# Patient Record
Sex: Female | Born: 1985 | Race: Black or African American | Hispanic: No | Marital: Married | Smoking: Never smoker
Health system: Southern US, Community
[De-identification: ages and names within clinical notes are randomized; demographics above are authoritative.]

## PROBLEM LIST (undated history)

## (undated) ENCOUNTER — Inpatient Hospital Stay (HOSPITAL_COMMUNITY): Payer: Self-pay

## (undated) DIAGNOSIS — E559 Vitamin D deficiency, unspecified: Secondary | ICD-10-CM

## (undated) DIAGNOSIS — Z9889 Other specified postprocedural states: Secondary | ICD-10-CM

## (undated) DIAGNOSIS — E669 Obesity, unspecified: Secondary | ICD-10-CM

## (undated) DIAGNOSIS — F32A Depression, unspecified: Secondary | ICD-10-CM

## (undated) DIAGNOSIS — F419 Anxiety disorder, unspecified: Secondary | ICD-10-CM

## (undated) DIAGNOSIS — D509 Iron deficiency anemia, unspecified: Secondary | ICD-10-CM

## (undated) DIAGNOSIS — R112 Nausea with vomiting, unspecified: Secondary | ICD-10-CM

## (undated) HISTORY — PX: OTHER SURGICAL HISTORY: SHX169

## (undated) HISTORY — DX: Vitamin D deficiency, unspecified: E55.9

## (undated) HISTORY — PX: GASTRIC BYPASS: SHX52

## (undated) HISTORY — DX: Iron deficiency anemia, unspecified: D50.9

## (undated) SURGERY — Surgical Case
Anesthesia: *Unknown

---

## 2014-04-20 ENCOUNTER — Ambulatory Visit: Payer: Self-pay | Admitting: Specialist

## 2014-05-12 HISTORY — PX: GASTRIC BYPASS: SHX52

## 2014-05-12 HISTORY — PX: ESOPHAGOGASTRODUODENOSCOPY: SHX1529

## 2015-10-24 ENCOUNTER — Encounter: Payer: Self-pay | Admitting: Emergency Medicine

## 2015-10-24 ENCOUNTER — Emergency Department
Admission: EM | Admit: 2015-10-24 | Discharge: 2015-10-25 | Disposition: A | Discharge status: Home / Self Care | Payer: Managed Care, Other (non HMO) | Attending: Emergency Medicine | Admitting: Emergency Medicine

## 2015-10-24 DIAGNOSIS — N760 Acute vaginitis: Secondary | ICD-10-CM | POA: Diagnosis not present

## 2015-10-24 DIAGNOSIS — Z8719 Personal history of other diseases of the digestive system: Secondary | ICD-10-CM | POA: Diagnosis not present

## 2015-10-24 DIAGNOSIS — N390 Urinary tract infection, site not specified: Secondary | ICD-10-CM

## 2015-10-24 DIAGNOSIS — N898 Other specified noninflammatory disorders of vagina: Secondary | ICD-10-CM | POA: Diagnosis present

## 2015-10-24 DIAGNOSIS — B9689 Other specified bacterial agents as the cause of diseases classified elsewhere: Secondary | ICD-10-CM

## 2015-10-24 LAB — URINALYSIS COMPLETE WITH MICROSCOPIC (ARMC ONLY)
BACTERIA UA: NONE SEEN
Bilirubin Urine: NEGATIVE
GLUCOSE, UA: NEGATIVE mg/dL
Hgb urine dipstick: NEGATIVE
KETONES UR: NEGATIVE mg/dL
NITRITE: NEGATIVE
PH: 5 (ref 5.0–8.0)
PROTEIN: NEGATIVE mg/dL
SPECIFIC GRAVITY, URINE: 1.018 (ref 1.005–1.030)

## 2015-10-24 LAB — CBC
HEMATOCRIT: 37.9 % (ref 35.0–47.0)
Hemoglobin: 12.8 g/dL (ref 12.0–16.0)
MCH: 30.2 pg (ref 26.0–34.0)
MCHC: 33.8 g/dL (ref 32.0–36.0)
MCV: 89.2 fL (ref 80.0–100.0)
PLATELETS: 204 10*3/uL (ref 150–440)
RBC: 4.25 MIL/uL (ref 3.80–5.20)
RDW: 13 % (ref 11.5–14.5)
WBC: 8.5 10*3/uL (ref 3.6–11.0)

## 2015-10-24 LAB — BASIC METABOLIC PANEL
ANION GAP: 8 (ref 5–15)
BUN: 7 mg/dL (ref 6–20)
CALCIUM: 8.8 mg/dL — AB (ref 8.9–10.3)
CO2: 23 mmol/L (ref 22–32)
CREATININE: 0.55 mg/dL (ref 0.44–1.00)
Chloride: 111 mmol/L (ref 101–111)
Glucose, Bld: 85 mg/dL (ref 65–99)
Potassium: 3 mmol/L — ABNORMAL LOW (ref 3.5–5.1)
SODIUM: 142 mmol/L (ref 135–145)

## 2015-10-24 LAB — POCT PREGNANCY, URINE: PREG TEST UR: NEGATIVE

## 2015-10-24 NOTE — ED Notes (Signed)
Pt c/o lower abd and lower back abd - Pt states she thinks she has a UTI - Pt c/o burning with urination and urinary frequency

## 2015-10-24 NOTE — ED Notes (Signed)
Patient ambulatory to triage with steady gait, without difficulty or distress noted; pt reports dysuria since last week with lower back and lower abd pain

## 2015-10-25 LAB — WET PREP, GENITAL
Sperm: NONE SEEN
TRICH WET PREP: NONE SEEN
YEAST WET PREP: NONE SEEN

## 2015-10-25 LAB — CHLAMYDIA/NGC RT PCR (ARMC ONLY)
CHLAMYDIA TR: NOT DETECTED
N gonorrhoeae: NOT DETECTED

## 2015-10-25 MED ORDER — METRONIDAZOLE 0.75 % VA GEL: 1.0000 | Freq: Every day | VAGINAL | Status: DC

## 2015-10-25 MED ORDER — CEPHALEXIN 500 MG PO CAPS: 500.0000 mg | ORAL_CAPSULE | Freq: Three times a day (TID) | ORAL | Status: AC

## 2015-10-25 MED ORDER — CEPHALEXIN 500 MG PO CAPS
500.0000 mg | ORAL_CAPSULE | Freq: Once | ORAL | Status: AC
  Administered 2015-10-25: 500 mg via ORAL
  Filled 2015-10-25: qty 1

## 2015-10-25 NOTE — Discharge Instructions (Signed)
Bacterial Vaginosis °Bacterial vaginosis is an infection of the vagina. It happens when too many germs (bacteria) grow in the vagina. Having this infection puts you at risk for getting other infections from sex. Treating this infection can help lower your risk for other infections, such as:  °· Chlamydia. °· Gonorrhea. °· HIV. °· Herpes. °HOME CARE °· Take your medicine as told by your doctor. °· Finish your medicine even if you start to feel better. °· Tell your sex partner that you have an infection. They should see their doctor for treatment. °· During treatment: °· Avoid sex or use condoms correctly. °· Do not douche. °· Do not drink alcohol unless your doctor tells you it is ok. °· Do not breastfeed unless your doctor tells you it is ok. °GET HELP IF: °· You are not getting better after 3 days of treatment. °· You have more grey fluid (discharge) coming from your vagina than before. °· You have more pain than before. °· You have a fever. °MAKE SURE YOU:  °· Understand these instructions. °· Will watch your condition. °· Will get help right away if you are not doing well or get worse. °  °This information is not intended to replace advice given to you by your health care provider. Make sure you discuss any questions you have with your health care provider. °  °Document Released: 02/05/2008 Document Revised: 05/19/2014 Document Reviewed: 12/08/2012 °Elsevier Interactive Patient Education ©2016 [employer] °Urinary Tract Infection °A urinary tract infection (UTI) can occur any place along the urinary tract. The tract includes the kidneys, ureters, bladder, and urethra. A type of germ called bacteria often causes a UTI. UTIs are often helped with antibiotic medicine.  °HOME CARE  °· If given, take antibiotics as told by your doctor. Finish them even if you start to feel better. °· Drink enough fluids to keep your pee (urine) clear or pale yellow. °· Avoid tea, drinks with caffeine, and bubbly (carbonated)  drinks. °· Pee often. Avoid holding your pee in for a long time. °· Pee before and after having sex (intercourse). °· Wipe from front to back after you poop (bowel movement) if you are a woman. Use each tissue only once. °GET HELP RIGHT AWAY IF:  °· You have back pain. °· You have lower belly (abdominal) pain. °· You have chills. °· You feel sick to your stomach (nauseous). °· You throw up (vomit). °· Your burning or discomfort with peeing does not go away. °· You have a fever. °· Your symptoms are not better in 3 days. °MAKE SURE YOU:  °· Understand these instructions. °· Will watch your condition. °· Will get help right away if you are not doing well or get worse. °  °This information is not intended to replace advice given to you by your health care provider. Make sure you discuss any questions you have with your health care provider. °  °Document Released: 10/15/2007 Document Revised: 05/19/2014 Document Reviewed: 11/27/2011 °Elsevier Interactive Patient Education ©2016 Elsevier Inc. ° °

## 2015-10-25 NOTE — ED Provider Notes (Signed)
[facility] Emergency Department Provider Note   ____________________________________________  Time seen: Approximately 12 AM  I have reviewed the triage vital signs and the nursing notes.   HISTORY  Chief Complaint Dysuria; Back Pain; and Abdominal Pain    HPI Haley Kramer is a 30 y.o. female with a history of a gastric bypass surgery as well as a diagnosis of gonorrhea which was treated this past March was presenting to the emergency department today with pressure when she urinates that radiates to her back. She also planning of a vaginal discharge. She is also recently treated for bacterial vaginosis in late May at [facility].  She says that she was negative for gonorrhea and Chlamydia as well as Trichomonas at that time and has not had any new sexual partners.She says the current vaginal discharges her baseline vaginal discharge.   History reviewed. No pertinent past medical history.  There are no active problems to display for this patient.   Past Surgical History  Procedure Laterality Date  . Gastric bypass      No current outpatient prescriptions on file.  Allergies Review of patient's allergies indicates no known allergies.  No family history on file.  Social History Social History  Substance Use Topics  . Smoking status: Never Smoker   . Smokeless tobacco: None  . Alcohol Use: No    Review of Systems Constitutional: No fever/chills Eyes: No visual changes. ENT: No sore throat. Cardiovascular: Denies chest pain. Respiratory: Denies shortness of breath. Gastrointestinal: No abdominal pain.  No nausea, no vomiting.  No diarrhea.  No constipation. Genitourinary:As above Musculoskeletal: Negative for back pain. Skin: Negative for rash. Neurological: Negative for headaches, focal weakness or numbness.  10-point ROS otherwise negative.  ____________________________________________   PHYSICAL EXAM:  VITAL SIGNS: ED  Triage Vitals  Enc Vitals Group     BP 10/24/15 2046 146/82 mmHg     Pulse Rate 10/24/15 2046 73     Resp 10/24/15 2046 18     Temp 10/24/15 2046 98 F (36.7 C)     Temp Source 10/24/15 2046 Oral     SpO2 10/24/15 2046 99 %     Weight 10/24/15 2046 195 lb (88.451 kg)     Height 10/24/15 2046  (1.702 m)     Head Cir --      Peak Flow --      Pain Score 10/24/15 2044 5     Pain Loc --      Pain Edu? --      Excl. in GC? --     Constitutional: Alert and oriented. Well appearing and in no acute distress. Eyes: Conjunctivae are normal. PERRL. EOMI. Head: Atraumatic. Nose: No congestion/rhinnorhea. Mouth/Throat: Mucous membranes are moist.   Neck: No stridor.   Cardiovascular: Normal rate, regular rhythm. Grossly normal heart sounds.  Good peripheral circulation. Respiratory: Normal respiratory effort.  No retractions. Lungs CTAB. Gastrointestinal: Soft and nontender. No distention.No CVA tenderness. Genitourinary: Normal external exam. Speculum exam with a small amount of white discharge. Bimanual exam without cervical motion tenderness.  No uterine or adnexal tenderness nor masses. Musculoskeletal: No lower extremity tenderness nor edema.  No joint effusions. Neurologic:  Normal speech and language. No gross focal neurologic deficits are appreciated.  Skin:  Skin is warm, dry and intact. No rash noted. Psychiatric: Mood and affect are normal. Speech and behavior are normal.  ____________________________________________   LABS (all labs ordered are listed, but only abnormal results are displayed)  Labs Reviewed  WET PREP, GENITAL - Abnormal; Notable for the following:    Clue Cells Wet Prep HPF POC PRESENT (*)    WBC, Wet Prep HPF POC FEW (*)    All other components within normal limits  BASIC METABOLIC PANEL - Abnormal; Notable for the following:    Potassium 3.0 (*)    Calcium 8.8 (*)    All other components within normal limits  URINALYSIS COMPLETEWITH MICROSCOPIC  ([facility] ONLY) - Abnormal; Notable for the following:    Color, Urine YELLOW (*)    APPearance HAZY (*)    Leukocytes, UA 1+ (*)    Squamous Epithelial / LPF 0-5 (*)    All other components within normal limits  URINE CULTURE  CHLAMYDIA/NGC RT PCR ([facility] ONLY)  CBC  POCT PREGNANCY, URINE   ____________________________________________  EKG   ____________________________________________  RADIOLOGY   ____________________________________________   PROCEDURES   ____________________________________________   INITIAL IMPRESSION / ASSESSMENT AND PLAN / ED COURSE  Pertinent labs & imaging results that were available during my care of the patient were reviewed by me and considered in my medical decision making (see chart for details).  ----------------------------------------- 1:18 AM on 10/25/2015 -----------------------------------------  Reviewed the lab results with the patient. It is possible secondary to her gastric bypass as she had poor absorption and still has the bacterial vaginosis present on her vaginal swabs. We will try a local cream for treatment. I'll also treat her empirically for urinary tract infection. She has symptoms of urinary tract infection but underwhelming urinalysis. Will send for culture. However, due to her treatments will treat mostly based on symptoms. She understands this plan and is willing to comply. Will be following up with her OB/GYN at the health Department. ____________________________________________   FINAL CLINICAL IMPRESSION(S) / ED DIAGNOSES  Urinary tract infection. Bacterial vaginosis.    NEW MEDICATIONS STARTED DURING THIS VISIT:  New Prescriptions   No medications on file     Note:  This document was prepared using Dragon voice recognition software and may include unintentional dictation errors.    Myrna Blazeravid Matthew Jovaun Levene, MD 10/25/15 [phone]

## 2015-10-26 LAB — URINE CULTURE

## 2019-02-25 ENCOUNTER — Other Ambulatory Visit: Payer: Self-pay

## 2019-02-25 ENCOUNTER — Ambulatory Visit (LOCAL_COMMUNITY_HEALTH_CENTER): Payer: Self-pay

## 2019-02-25 DIAGNOSIS — Z111 Encounter for screening for respiratory tuberculosis: Secondary | ICD-10-CM

## 2019-02-28 ENCOUNTER — Other Ambulatory Visit: Payer: Self-pay

## 2019-02-28 ENCOUNTER — Ambulatory Visit (LOCAL_COMMUNITY_HEALTH_CENTER): Payer: Self-pay

## 2019-02-28 DIAGNOSIS — Z111 Encounter for screening for respiratory tuberculosis: Secondary | ICD-10-CM

## 2019-02-28 LAB — TB SKIN TEST
Induration: 0 mm
TB Skin Test: NEGATIVE

## 2020-02-20 ENCOUNTER — Other Ambulatory Visit: Payer: Self-pay

## 2020-02-20 ENCOUNTER — Ambulatory Visit (LOCAL_COMMUNITY_HEALTH_CENTER): Payer: Medicaid Other

## 2020-02-20 DIAGNOSIS — Z111 Encounter for screening for respiratory tuberculosis: Secondary | ICD-10-CM

## 2020-02-20 DIAGNOSIS — Z23 Encounter for immunization: Secondary | ICD-10-CM | POA: Diagnosis not present

## 2020-02-20 NOTE — Progress Notes (Signed)
PPD placed today. Tdap and Flu vaccines given today and tolerated well. Updated [facility] copy given and explained. Jerel Shepherd, RN

## 2020-02-23 ENCOUNTER — Other Ambulatory Visit: Payer: Self-pay

## 2020-02-23 ENCOUNTER — Ambulatory Visit (LOCAL_COMMUNITY_HEALTH_CENTER): Payer: Medicaid Other

## 2020-02-23 DIAGNOSIS — Z111 Encounter for screening for respiratory tuberculosis: Secondary | ICD-10-CM

## 2020-02-23 LAB — TB SKIN TEST
Induration: 0 mm
TB Skin Test: NEGATIVE

## 2020-02-29 ENCOUNTER — Other Ambulatory Visit: Payer: Medicaid Other

## 2020-05-07 ENCOUNTER — Other Ambulatory Visit: Payer: Medicaid Other

## 2020-05-07 DIAGNOSIS — Z20822 Contact with and (suspected) exposure to covid-19: Secondary | ICD-10-CM

## 2020-05-08 LAB — SARS-COV-2, NAA 2 DAY TAT

## 2020-05-08 LAB — NOVEL CORONAVIRUS, NAA: SARS-CoV-2, NAA: NOT DETECTED

## 2020-06-13 ENCOUNTER — Ambulatory Visit: Payer: Medicaid Other | Admitting: Physician Assistant

## 2020-06-13 ENCOUNTER — Encounter: Payer: Self-pay | Admitting: Physician Assistant

## 2020-06-13 ENCOUNTER — Other Ambulatory Visit: Payer: Self-pay

## 2020-06-13 DIAGNOSIS — Z113 Encounter for screening for infections with a predominantly sexual mode of transmission: Secondary | ICD-10-CM | POA: Diagnosis not present

## 2020-06-13 DIAGNOSIS — B9689 Other specified bacterial agents as the cause of diseases classified elsewhere: Secondary | ICD-10-CM

## 2020-06-13 DIAGNOSIS — N760 Acute vaginitis: Secondary | ICD-10-CM | POA: Diagnosis not present

## 2020-06-13 DIAGNOSIS — I10 Essential (primary) hypertension: Secondary | ICD-10-CM | POA: Insufficient documentation

## 2020-06-13 LAB — WET PREP FOR TRICH, YEAST, CLUE
Trichomonas Exam: NEGATIVE
Yeast Exam: NEGATIVE

## 2020-06-13 MED ORDER — METRONIDAZOLE 500 MG PO TABS: 500.0000 mg | ORAL_TABLET | Freq: Two times a day (BID) | ORAL | 0 refills | Status: AC

## 2020-06-13 NOTE — Progress Notes (Signed)
  [facility] Department STI clinic/screening visit  Subjective:  Haley Kramer is a 35 y.o. female being seen today for an STI screening visit. The patient reports they do have symptoms.  Patient reports that they do not desire a pregnancy in the next year.   They reported they are not interested in discussing contraception today.  Patient's last menstrual period was 06/07/2020.   Patient has the following medical conditions:   Patient Active Problem List   Diagnosis Date Noted  . Benign hypertension 06/13/2020    Chief Complaint  Patient presents with  . SEXUALLY TRANSMITTED DISEASE    screening    HPI  Patient reports that she has had a fishy vaginal odor for 3 months.  Denies other symptoms and chronic conditions.  Reports that she is on Remeron for anxiety and depression.  Reports last HIV test was 2 years ago and last pap was in 2020.  States that her IUD fell out on 05/11/2020, and she has an appointment with her regular provider to discuss her options.   See flowsheet for further details and programmatic requirements.    The following portions of the patient's history were reviewed and updated as appropriate: allergies, current medications, past medical history, past social history, past surgical history and problem list.  Objective:  There were no vitals filed for this visit.  Physical Exam Constitutional:      General: She is not in acute distress.    Appearance: Normal appearance.  HENT:     Head: Normocephalic and atraumatic.  Eyes:     Conjunctiva/sclera: Conjunctivae normal.  Pulmonary:     Effort: Pulmonary effort is normal.  Skin:    General: Skin is warm and dry.     Findings: No bruising, erythema, lesion or rash.  Neurological:     Mental Status: She is alert and oriented to person, place, and time.  Psychiatric:        Mood and Affect: Mood normal.        Behavior: Behavior normal.        Thought Content: Thought content normal.         Judgment: Judgment normal.      Assessment and Plan:  Teana R Sporer is a 35 y.o. female presenting to the [facility] Department for STI screening  1. Screening for STD (sexually transmitted disease) Patient into clinic with symptoms. Patient declines provider exam and opts to self-collect vaginal samples.  Counseled patient how to collect for accurate results. Rec condoms with all sex. Await test results.  Counseled that RN will call if needs to RTC for treatment once results are back. - WET PREP FOR TRICH, YEAST, CLUE - Chlamydia/Gonorrhea [place] [facility] - HIV [place] [facility] - Syphilis Serology, [place]  2. BV (bacterial vaginosis) Will treat for BV with Metronidazole 500 mg #14 1 po BID for 7 days with food, no EtOH for 24 hr before and until 72 hr after completing medicine. No sex for 14 days. Enc to use OTC antifungal cream if has itching during or just after using antibiotics. Reviewed with patient steps to prevent BV. - metroNIDAZOLE (FLAGYL) 500 MG tablet; Take 1 tablet (500 mg total) by mouth 2 (two) times daily for 7 days.  Dispense: 14 tablet; Refill: 0   No follow-ups on file.  No future appointments.  Matt Holmes, PA

## 2020-07-02 ENCOUNTER — Encounter: Payer: Self-pay | Admitting: Student

## 2020-10-25 ENCOUNTER — Ambulatory Visit: Payer: Medicaid Other

## 2020-10-29 ENCOUNTER — Other Ambulatory Visit: Payer: Self-pay

## 2020-10-29 ENCOUNTER — Ambulatory Visit (LOCAL_COMMUNITY_HEALTH_CENTER): Payer: Medicaid Other | Admitting: Physician Assistant

## 2020-10-29 ENCOUNTER — Encounter: Payer: Self-pay | Admitting: Physician Assistant

## 2020-10-29 VITALS — BP 121/84 | Ht 67.0 in | Wt 208.8 lb

## 2020-10-29 DIAGNOSIS — Z01419 Encounter for gynecological examination (general) (routine) without abnormal findings: Secondary | ICD-10-CM | POA: Diagnosis not present

## 2020-10-29 DIAGNOSIS — Z309 Encounter for contraceptive management, unspecified: Secondary | ICD-10-CM

## 2020-10-29 DIAGNOSIS — Z3009 Encounter for other general counseling and advice on contraception: Secondary | ICD-10-CM | POA: Diagnosis not present

## 2020-10-29 DIAGNOSIS — Z113 Encounter for screening for infections with a predominantly sexual mode of transmission: Secondary | ICD-10-CM

## 2020-10-29 LAB — PREGNANCY, URINE: Preg Test, Ur: NEGATIVE

## 2020-10-29 NOTE — Progress Notes (Addendum)
Pt here for PE.  Pt has not had her period in 2 mos and is trying to get pregnant.  Home PT was negative x 2 weeks ago.   Patient informed of negative PT taken this morning. Berdie Ogren, RN

## 2020-10-29 NOTE — Progress Notes (Signed)
[facility] DEPARTMENT [facility] [address] Main Number: [phone]    Family Planning Visit- Initial Visit  Subjective:  Haley Kramer is a 35 y.o.  G1P0   being seen today for an initial annual visit and to discuss contraceptive options.  The patient is currently using None for pregnancy prevention. Patient reports she does want a pregnancy in the next year.  Patient has the following medical conditions has Benign hypertension on their problem list.  Chief Complaint  Patient presents with   Contraception    Initial/annual visit.    Patient reports that she had gastric bypass surgery and has so far lost about 250 lbs.  States that she would like to get pregnant but has not had a period since April.  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  Reports feeling really stressed lately and that her migraines are usually when she has added stressors, though she has not had more migraines lately.  Reports that she has "regular" headaches occasionally that are relieved with rest and OTC analgesics.  Reports that she has a goal weight of 160 lbs and is still working on making it to that goal.  Reports nausea is related to being post gastric bypass and she has not had any change to the nausea.  States that her feet and ankles swell if she is on her feet for long period but that the swelling resolves when she sits with feet elevated.  Reports that she has not had elevated BP since she has lost weight after her surgery.  States last pap/physical was at [facility] in 2019.  Patient denies any other concerns today.    Body mass index is 32.7 kg/m. - Patient is eligible for diabetes screening based on BMI and age >25?  not applicable HA1C ordered? not applicable  Patient reports 2  partner/s in last year. Desires STI screening?  Yes  Has patient been screened once for HCV in the past?  No  No results found for:  HCVAB  Does the patient have current drug use (including MJ), have a partner with drug use, and/or has been incarcerated since last result? No  If yes-- Screen for HCV through [facility]   Does the patient meet criteria for HBV testing? No  Criteria:  -Household, sexual or needle sharing contact with HBV -History of drug use -HIV positive -Those with known Hep C   Health Maintenance Due  Topic Date Due   HIV Screening  Never done   Hepatitis C Screening  Never done   PAP SMEAR-Modifier  Never done   COVID-19 Vaccine (2 - Booster for Janssen series) 10/13/2019    Review of Systems  All other systems reviewed and are negative.  The following portions of the patient's history were reviewed and updated as appropriate: allergies, current medications, past family history, past medical history, past social history, past surgical history and problem list. Problem list updated.   See flowsheet for other program required questions.  Objective:   Vitals:   10/29/20 1007  BP: 121/84  Weight: 208 lb 12.8 oz (94.7 kg)  Height: 5\' 7"  (1.702 m)    Physical Exam Constitutional:      General: She is not in acute distress.    Appearance: Normal appearance.  HENT:     Head: Normocephalic and atraumatic.     Mouth/Throat:     Mouth: Mucous membranes are moist.  Pharynx: Oropharynx is clear. No oropharyngeal exudate or posterior oropharyngeal erythema.  Eyes:     Conjunctiva/sclera: Conjunctivae normal.  Neck:     Thyroid: No thyroid mass, thyromegaly or thyroid tenderness.  Cardiovascular:     Rate and Rhythm: Normal rate and regular rhythm.  Pulmonary:     Effort: Pulmonary effort is normal.  Chest:  Breasts:    Right: Normal. No mass, nipple discharge, skin change, tenderness, axillary adenopathy or supraclavicular adenopathy.     Left: Normal. No mass, nipple discharge, skin change, tenderness, axillary adenopathy or supraclavicular adenopathy.  Abdominal:      Palpations: Abdomen is soft. There is no mass.     Tenderness: There is no abdominal tenderness. There is no guarding or rebound.  Genitourinary:    General: Normal vulva.     Rectum: Normal.     Comments: External genitalia/pubic area without nits, lice, edema, erythema, lesions and inguinal adenopathy. Vagina with normal mucosa and discharge. Cervix without visible lesions. Uterus firm, mobile, nt, no masses, no CMT, no adnexal tenderness or fullness.  Musculoskeletal:     Cervical back: Neck supple. No tenderness.  Lymphadenopathy:     Cervical: No cervical adenopathy.     Upper Body:     Right upper body: No supraclavicular, axillary or pectoral adenopathy.     Left upper body: No supraclavicular, axillary or pectoral adenopathy.  Skin:    General: Skin is warm and dry.     Findings: No bruising, erythema, lesion or rash.  Neurological:     Mental Status: She is alert and oriented to person, place, and time.  Psychiatric:        Mood and Affect: Mood normal.        Behavior: Behavior normal.        Thought Content: Thought content normal.        Judgment: Judgment normal.      Assessment and Plan:  Haley Kramer is a 35 y.o. female presenting to the [facility] Department for an initial annual wellness/contraceptive visit  Contraception counseling: Reviewed all forms of birth control options in the tiered based approach. available including abstinence; over the counter/barrier methods; hormonal contraceptive medication including pill, patch, ring, injection,contraceptive implant, ECP; hormonal and nonhormonal IUDs; permanent sterilization options including vasectomy and the various tubal sterilization modalities. Risks, benefits, and typical effectiveness rates were reviewed.  Questions were answered.  Written information was also given to the patient to review.  Patient desires to achieve pregnancy. She will follow up in  3-4 months if she does not resume having  her periods or 1 year for annual exam for surveillance.  She was told to call with any further questions, or with any concerns about this method of contraception.  Emphasized use of condoms 100% of the time for STI prevention.  Patient was not a candidate for ECP today.   1. Encounter for counseling regarding contraception Reviewed with patient how to track cycle and determine when ovulation occurs. Enc to try hobbies to relax and relieve stress. RTC if changes mind re: achieving pregnancy to discuss hormonal BCM.  2. Screening for STD (sexually transmitted disease) Await test results.  Counseled that RN will call if needs to RTC for treatment once results are back.  - Chlamydia/Gonorrhea [facility] - HIV/HCV [place] [facility] - Syphilis Serology, [place] [facility]  3. Well woman exam with routine gynecological exam Reviewed with patient healthy habits to maintain general health. Enc regular exercise and  healthy diet, avoid EtOH, tobacco, excess caffeine and any teratogens. Enc MVI 1 po daily. Patient counseled to RTC in 3-4 months if menses has not resumed for evaluation and possible Provera challenge. Enc to establish with/ follow up with PCP for primary care concerns, age appropriate screenings and illness.  Await pap results.  Counseled that RN will call or send a letter once results are back.  - IGP, Aptima HPV - Pregnancy, urine     Return in about 1 year (around 10/29/2021) for RP and prn.  No future appointments.  Matt Holmes, PA

## 2020-11-01 LAB — HM HEPATITIS C SCREENING LAB: HM Hepatitis Screen: NEGATIVE

## 2020-11-01 LAB — IGP, APTIMA HPV
HPV Aptima: NEGATIVE
PAP Smear Comment: 0

## 2020-11-01 LAB — HM HIV SCREENING LAB: HM HIV Screening: NEGATIVE

## 2020-12-19 ENCOUNTER — Ambulatory Visit (LOCAL_COMMUNITY_HEALTH_CENTER): Payer: Medicaid Other

## 2020-12-19 ENCOUNTER — Other Ambulatory Visit: Payer: Self-pay

## 2020-12-19 VITALS — BP 112/75 | Ht 67.0 in | Wt 201.5 lb

## 2020-12-19 DIAGNOSIS — Z3201 Encounter for pregnancy test, result positive: Secondary | ICD-10-CM | POA: Diagnosis not present

## 2020-12-19 LAB — PREGNANCY, URINE: Preg Test, Ur: POSITIVE — AB

## 2020-12-19 MED ORDER — PRENATAL 27-0.8 MG PO TABS: 1.0000 | ORAL_TABLET | Freq: Every day | ORAL | 0 refills | Status: AC

## 2020-12-19 NOTE — Progress Notes (Signed)
Consulted by RN re: patient situation.  Reviewed RN note and agree that it reflects our discussion and my recommendations. 

## 2020-12-19 NOTE — Progress Notes (Signed)
UPT positive. Plans prenatal care at [facility]. Hx bariatric surgery 2016 and lost over 300 lbs. States she contacted her surgeon Dr. Smitty Cords [facility]) who recommends frequent small meals, increase protein intake, and liquid prenatal vitamin which she is currently taking. Pt states that Dr. Smitty Cords gave ok to take prenatal vitamin tablet if runs out of liquid prenatal vitamin. Reports nausea and taking Zofran that she had previously.  Consult Beatris Si, PA who advises pt to stop zofran d/t precaution warning in pregnancy and recommends frequent small meals, ginger ale, ginger snaps, peppermint or sour candies as ways to decrease nausea. Advised to establish prenatal care and consult her provider. Pt in agreement. Questions answered and reports understanding. To clerk for preadmit. Jerel Shepherd, RN

## 2020-12-28 ENCOUNTER — Telehealth: Payer: Self-pay | Admitting: Family Medicine

## 2020-12-28 NOTE — Telephone Encounter (Signed)
Patient went to the ER last night because of light bleeding. She was told to have a follow up.

## 2020-12-28 NOTE — Telephone Encounter (Signed)
Client with positive UPT at [facility] 12/19/2020 and to [facility] yesterday for evaluation of vaginal bleeding. Per client, left after 4 hours as tired. Client did not see provider and no US done. 12/27/2020 beta hcg = 20, 248. Consult with Elveria Rising FNP-BC and client needs beta hcg next week. Appt scheduled. Client counseled that all [facility] will do at appt is draw her pregnancy hormone level. Counseled to return to ED for worsening vaginal bleeding / pain. Reports vaginal spotting only this am. Jossie Ng, RN

## 2020-12-31 ENCOUNTER — Ambulatory Visit (INDEPENDENT_AMBULATORY_CARE_PROVIDER_SITE_OTHER): Payer: Medicaid Other | Admitting: Obstetrics and Gynecology

## 2020-12-31 ENCOUNTER — Encounter: Payer: Self-pay | Admitting: Obstetrics and Gynecology

## 2020-12-31 ENCOUNTER — Other Ambulatory Visit: Payer: Medicaid Other

## 2020-12-31 ENCOUNTER — Other Ambulatory Visit: Payer: Self-pay

## 2020-12-31 ENCOUNTER — Ambulatory Visit
Admission: RE | Admit: 2020-12-31 | Discharge: 2020-12-31 | Disposition: A | Discharge status: Home / Self Care | Payer: Medicaid Other | Source: Ambulatory Visit | Attending: Obstetrics and Gynecology | Admitting: Obstetrics and Gynecology

## 2020-12-31 VITALS — BP 130/70 | Ht 67.0 in | Wt 200.0 lb

## 2020-12-31 DIAGNOSIS — O200 Threatened abortion: Secondary | ICD-10-CM

## 2020-12-31 DIAGNOSIS — O3680X0 Pregnancy with inconclusive fetal viability, not applicable or unspecified: Secondary | ICD-10-CM

## 2020-12-31 NOTE — Patient Instructions (Signed)
Threatened Miscarriage A threatened miscarriage occurs when a woman has vaginal bleeding during the first 20 weeks of pregnancy but the pregnancy has not ended. If vaginal bleeding occurs during this time, the health care provider will do tests to make sure the woman is still pregnant. The woman's condition may be considered a threatened miscarriage if the tests show: That she is still pregnant. That the embryo or unborn baby (fetus) inside the uterus is still growing. A threatened miscarriage does not mean your pregnancy will end, but it does increase the risk of losing your pregnancy (miscarriage). What are the causes? The cause of this condition is usually not known. What increases the risk? The following factors may make a pregnant woman more likely to have amiscarriage: Certain medical conditions Conditions that affect the hormone balance in the body, such as thyroid disease or polycystic ovary syndrome. Diabetes. Autoimmune disorders. Infections. Bleeding disorders. Obesity. Lifestyle factors Using products with tobacco or nicotine or being exposed to tobacco smoke. Having alcohol. Having large amounts of caffeine. Recreational drug use. Problems with reproductive organs or structures Cervical insufficiency. This is when the the lowest part of the uterus (cervix) opens and thins before pregnancy is at term. Having a condition called Asherman syndrome, which causes scarring in the uterus or causes the uterus to be abnormal in structure. Fibrous growths, called fibroids, in the uterus. Congenital abnormalities. These problems are present at birth. Infection of the cervix or uterus. Personal or medical history Injury (trauma). Having had a miscarriage before. Being younger than age 86 or older than age 24. Exposure to harmful substances in the environment. This may include radiation or heavy metals, such as lead. Using certain medicines. What are the signs or symptoms? Symptoms  of this condition include: Vaginal bleeding or spotting, with or without cramps or pain. Mild pain or cramps in your abdomen. How is this diagnosed? You may have tests to check whether you are still pregnant. These tests will be done if you have bleeding, with or without pain, in your abdomen before the 20th week of pregnancy. These tests include: Ultrasound. A physical exam. Measurement of your baby's heart rate. Lab tests, such as blood tests, urine tests, or swabs for infection. You may be diagnosed with a threatened miscarriage if: Ultrasound testing shows that you are still pregnant. Your baby's heart rate is strong. A physical exam shows that your cervix is closed. Blood tests confirm that you are still pregnant. How is this treated? No treatments have been shown to prevent a threatened miscarriage from going onto a complete miscarriage. However, the right home care is important. Follow these instructions at home: Get plenty of rest. Do not have sex, douche, or put anything in your vagina, such as tampons, until your health care provider says it is okay. Do not smoke or use recreational drugs. Do not drink alcohol. Avoid caffeine. Keep all follow-up prenatal visits. This is important. Contact a health care provider if: You have light vaginal bleeding or spotting while pregnant. You have pain or cramping in your abdomen. You have a fever. Get help right away if: Heavy bleeding soaks through 2 large sanitary pads an hour for more than 2 hours. Blood clots come out of your vagina. Tissue comes out of your vagina. You leak fluid, or you have a gush of fluid from your vagina. You have severe low back pain or cramps in your abdomen. You have a fever, chills, and severe pain in the abdomen. Summary A threatened miscarriage  occurs when a woman bleeds from the vagina during the first 20 weeks of pregnancy but the pregnancy has not ended. The cause of a threatened miscarriage is  usually not known. Symptoms of this condition may include vaginal bleeding and mild pain or cramps in your abdomen. No treatments have been shown to prevent a threatened miscarriage from going on to a complete miscarriage. Keep all follow-up prenatal visits. This is important. This information is not intended to replace advice given to you by your health care provider. Make sure you discuss any questions you have with your healthcare provider. Document Revised: 10/28/2019 Document Reviewed: 10/28/2019 Elsevier Patient Education  2022 [employer].

## 2020-12-31 NOTE — Telephone Encounter (Signed)
Consulted by RN re: patient situation.  Reviewed RN note and agree that it reflects our discussion and my recommendations.  ° ° °Diamon Reddinger, FNP  °

## 2020-12-31 NOTE — Progress Notes (Signed)
Patient ID: Haley Kramer, female   DOB: [birth year], 35 y.o.   MRN: [id number]  Reason for Consult: Follow-up   Referred by [place], Julie A, MD  Subjective:     HPI:  Haley Kramer is a 35 y.o. female.  She reports that her first positive pregnancy test was on August 1.  She had a small amount of light bleeding and spotting which occurred on August 18.  She went to the [facility] ER and had a beta-hCG which was performed which was elevated around 20,000.  She left without being able to see a provider or an ultrasound to have an ultrasound performed.  She had more vaginal bleeding which occurred on Saturday, 20 August.  She reports that she was seen in [place] have [facility] that day.  She had a abdominal ultrasound performed which showed a gestational sac but no fetal pole.  She cannot have a transvaginal ultrasound performed that day.  She reports that she has not had any further bleeding or pain.  She has planned follow-up on September 16 with the health department to establish prenatal care.  She has a history of bariatric surgery and has been taking her bariatric vitamins as well as a prenatal vitamin.  She denies taking any other medications.   Gynecological History  Patient's last menstrual period was 11/09/2020. Menarche: 11  History of fibroids, polyps, or ovarian cysts? : no  History of PCOS? no Hstory of Endometriosis? no History of abnormal pap smears? no Have you had any sexually transmitted infections in the past? yes  She denies HPV vaccination in the past.   Last Pap: Results were: 2022- NIL   She identifies as a female. She is sexually active with men.   She denies dyspareunia. She denies postcoital bleeding.  She currently uses none for contraception.   Obstetrical History OB History  Gravida Para Term Preterm AB Living  2 1 1  0 0 1  SAB IAB Ectopic Multiple Live Births  0 0 0 0 1    # Outcome Date GA Lbr Len/2nd Weight Sex Delivery  Anes PTL Lv  2 Current           1 Term 03/23/11    F    LIV     No past medical history on file. Family History  Problem Relation Age of Onset   Hypertension Mother    Diabetes Mother    Hypertension Father    Diabetes Father    Hypertension Maternal Grandmother    Diabetes Maternal Grandmother    Hypertension Maternal Grandfather    Diabetes Maternal Grandfather    Past Surgical History:  Procedure Laterality Date   CESAREAN SECTION  03/22/2010   GASTRIC BYPASS      Short Social History:  Social History   Tobacco Use   Smoking status: Never   Smokeless tobacco: Never  Substance Use Topics   Alcohol use: No    No Known Allergies  Current Outpatient Medications  Medication Sig Dispense Refill   Prenatal Vit-Fe Fumarate-FA (MULTIVITAMIN-PRENATAL) 27-0.8 MG TABS tablet Take 1 tablet by mouth daily at 12 noon. 100 tablet 0   ondansetron (ZOFRAN) 4 MG tablet Take 4 mg by mouth every 8 (eight) hours as needed for nausea or vomiting. (Patient not taking: Reported on 12/31/2020)     No current facility-administered medications for this visit.    Review of Systems  Constitutional: Negative for chills, fatigue, fever and unexpected weight change.  HENT:  Negative for trouble swallowing.  Eyes: Negative for loss of vision.  Respiratory: Negative for cough, shortness of breath and wheezing.  Cardiovascular: Negative for chest pain, leg swelling, palpitations and syncope.  GI: Negative for abdominal pain, blood in stool, diarrhea, nausea and vomiting.  GU: Negative for difficulty urinating, dysuria, frequency and hematuria.  Musculoskeletal: Negative for back pain, leg pain and joint pain.  Skin: Negative for rash.  Neurological: Negative for dizziness, headaches, light-headedness, numbness and seizures.  Psychiatric: Negative for behavioral problem, confusion, depressed mood and sleep disturbance.       Objective:  Objective   Vitals:   12/31/20 1326  BP: 130/70   Weight: 200 lb (90.7 kg)  Height: 5\' 7"  (1.702 m)   Body mass index is 31.32 kg/m.  Physical Exam Vitals and nursing note reviewed. Exam conducted with a chaperone present.  Constitutional:      Appearance: Normal appearance.  HENT:     Head: Normocephalic and atraumatic.  Eyes:     Extraocular Movements: Extraocular movements intact.     Pupils: Pupils are equal, round, and reactive to light.  Cardiovascular:     Rate and Rhythm: Normal rate and regular rhythm.  Pulmonary:     Effort: Pulmonary effort is normal.     Breath sounds: Normal breath sounds.  Abdominal:     General: Abdomen is flat.     Palpations: Abdomen is soft.  Musculoskeletal:     Cervical back: Normal range of motion.  Skin:    General: Skin is warm and dry.  Neurological:     General: No focal deficit present.     Mental Status: She is alert and oriented to person, place, and time.  Psychiatric:        Behavior: Behavior normal.        Thought Content: Thought content normal.        Judgment: Judgment normal.    Assessment/Plan:     35 year old with threatened miscarriage.  7 weeks 3 days by last menstrual period. Stat ultrasound ordered for bleeding in early pregnancy. We will plan follow-up based off of today's ultrasound results.  Discussed that if the gestational sac is seen in the uterus a ultrasound will have to be repeated in 2 weeks to assess for fetal development.  More than 30 minutes were spent face to face with the patient in the room, reviewing the medical record, labs and images, and coordinating care for the patient. The plan of management was discussed in detail and counseling was provided.    31 MD [facility] 12/31/2020 1:28 PM

## 2021-01-14 ENCOUNTER — Emergency Department: Payer: Medicaid Other

## 2021-01-14 ENCOUNTER — Emergency Department
Admission: EM | Admit: 2021-01-14 | Discharge: 2021-01-14 | Disposition: A | Discharge status: Home / Self Care | Payer: Medicaid Other | Attending: Emergency Medicine | Admitting: Emergency Medicine

## 2021-01-14 ENCOUNTER — Other Ambulatory Visit: Payer: Self-pay

## 2021-01-14 DIAGNOSIS — Z3A08 8 weeks gestation of pregnancy: Secondary | ICD-10-CM | POA: Diagnosis not present

## 2021-01-14 DIAGNOSIS — O10011 Pre-existing essential hypertension complicating pregnancy, first trimester: Secondary | ICD-10-CM | POA: Insufficient documentation

## 2021-01-14 DIAGNOSIS — N939 Abnormal uterine and vaginal bleeding, unspecified: Secondary | ICD-10-CM

## 2021-01-14 DIAGNOSIS — Z2913 Encounter for prophylactic Rho(D) immune globulin: Secondary | ICD-10-CM | POA: Insufficient documentation

## 2021-01-14 DIAGNOSIS — O034 Incomplete spontaneous abortion without complication: Secondary | ICD-10-CM | POA: Diagnosis not present

## 2021-01-14 DIAGNOSIS — O209 Hemorrhage in early pregnancy, unspecified: Secondary | ICD-10-CM | POA: Diagnosis present

## 2021-01-14 HISTORY — DX: Incomplete spontaneous abortion without complication: O03.4

## 2021-01-14 LAB — CBC
HCT: 36.2 % (ref 36.0–46.0)
Hemoglobin: 12.1 g/dL (ref 12.0–15.0)
MCH: 28.2 pg (ref 26.0–34.0)
MCHC: 33.4 g/dL (ref 30.0–36.0)
MCV: 84.4 fL (ref 80.0–100.0)
Platelets: 268 10*3/uL (ref 150–400)
RBC: 4.29 MIL/uL (ref 3.87–5.11)
RDW: 14.8 % (ref 11.5–15.5)
WBC: 9.6 10*3/uL (ref 4.0–10.5)
nRBC: 0 % (ref 0.0–0.2)

## 2021-01-14 LAB — BASIC METABOLIC PANEL
Anion gap: 5 (ref 5–15)
BUN: 9 mg/dL (ref 6–20)
CO2: 22 mmol/L (ref 22–32)
Calcium: 8.4 mg/dL — ABNORMAL LOW (ref 8.9–10.3)
Chloride: 108 mmol/L (ref 98–111)
Creatinine, Ser: 0.48 mg/dL (ref 0.44–1.00)
GFR, Estimated: >60 mL/min (ref >60)
Glucose, Bld: 93 mg/dL (ref 70–99)
Potassium: 3.3 mmol/L — ABNORMAL LOW (ref 3.5–5.1)
Sodium: 135 mmol/L (ref 135–145)

## 2021-01-14 LAB — POC URINE PREG, ED: Preg Test, Ur: POSITIVE — AB

## 2021-01-14 LAB — ABO/RH: ABO/RH(D): O NEG

## 2021-01-14 LAB — HCG, QUANTITATIVE, PREGNANCY: hCG, Beta Chain, Quant, S: 77155 m[IU]/mL — ABNORMAL HIGH (ref <5)

## 2021-01-14 LAB — ANTIBODY SCREEN: Antibody Screen: NEGATIVE

## 2021-01-14 MED ORDER — RHO D IMMUNE GLOBULIN 1500 UNIT/2ML IJ SOSY
300.0000 ug | PREFILLED_SYRINGE | Freq: Once | INTRAMUSCULAR | Status: AC
  Administered 2021-01-14: 300 ug via INTRAMUSCULAR
  Filled 2021-01-14: qty 2

## 2021-01-14 NOTE — ED Triage Notes (Signed)
Pt to ED for vaginal bleeding that started Friday,  weeks pregnant. 2nd pregnancy.  Reports passed clot today.  1 pad a day.

## 2021-01-14 NOTE — Discharge Instructions (Addendum)
Keep your appointment for Thursday at [facility]. Return emergency department for worsening

## 2021-01-14 NOTE — ED Provider Notes (Signed)
[facility] Emergency Department Provider Note  ____________________________________________   Event Date/Time   First MD Initiated Contact with Patient 01/14/21 1017     (approximate)  I have reviewed the triage vital signs and the nursing notes.   HISTORY  Chief Complaint Vaginal Bleeding    HPI Haley Kramer is a 35 y.o. female presents emergency department complaining of vaginal bleeding that started on Friday.  Patient is approximately [redacted] weeks pregnant.  Started having spotting on Friday and had a large clot earlier today when she went to the bathroom.  States she is not bleeding heavily its more of a spotting.  Became very concerned due to the clot.  Has not had OB/GYN care yet.  Is scheduled to see someone at [facility] next week.  No fever or chills.  Patient has a 23 year old child in no history of miscarriages  History reviewed. No pertinent past medical history.  Patient Active Problem List   Diagnosis Date Noted   Benign hypertension 06/13/2020    Past Surgical History:  Procedure Laterality Date   CESAREAN SECTION  03/22/2010   GASTRIC BYPASS      Prior to Admission medications   Medication Sig Start Date End Date Taking? Authorizing Provider  Prenatal Vit-Fe Fumarate-FA (MULTIVITAMIN-PRENATAL) 27-0.8 MG TABS tablet Take 1 tablet by mouth daily at 12 noon. 12/19/20 03/29/21  Federico Flake, MD    Allergies Patient has no known allergies.  Family History  Problem Relation Age of Onset   Hypertension Mother    Diabetes Mother    Hypertension Father    Diabetes Father    Hypertension Maternal Grandmother    Diabetes Maternal Grandmother    Hypertension Maternal Grandfather    Diabetes Maternal Grandfather     Social History Social History   Tobacco Use   Smoking status: Never   Smokeless tobacco: Never  Vaping Use   Vaping Use: Never used  Substance Use Topics   Alcohol use: No   Drug use: Never    Review  of Systems  Constitutional: No fever/chills Eyes: No visual changes. ENT: No sore throat. Respiratory: Denies cough Cardiovascular: Denies chest pain Gastrointestinal: Denies abdominal pain Genitourinary: Negative for dysuria. Musculoskeletal: Negative for back pain. Skin: Negative for rash. Psychiatric: no mood changes,     ____________________________________________   PHYSICAL EXAM:  VITAL SIGNS: ED Triage Vitals [01/14/21 1004]  Enc Vitals Group     BP 131/75     Pulse Rate 89     Resp 18     Temp 97.8 F (36.6 C)     Temp Source Oral     SpO2 100 %     Weight 196 lb (88.9 kg)     Height 5\' 7"  (1.702 m)     Head Circumference      Peak Flow      Pain Score 0     Pain Loc      Pain Edu?      Excl. in GC?     Constitutional: Alert and oriented. Well appearing and in no acute distress. Eyes: Conjunctivae are normal.  Head: Atraumatic. Nose: No congestion/rhinnorhea. Mouth/Throat: Mucous membranes are moist.   Neck:  supple no lymphadenopathy noted Cardiovascular: Normal rate, regular rhythm.  Respiratory: Normal respiratory effort.  No retractions Abd: soft nontender bs normal all 4 quad GU: deferred Musculoskeletal: FROM all extremities, warm and well perfused Neurologic:  Normal speech and language.  Skin:  Skin is warm, dry and intact.  No rash noted. Psychiatric: Mood and affect are normal. Speech and behavior are normal.  ____________________________________________   LABS (all labs ordered are listed, but only abnormal results are displayed)  Labs Reviewed  HCG, QUANTITATIVE, PREGNANCY - Abnormal; Notable for the following components:      Result Value   hCG, Beta Chain, Quant, S 77,155 (*)    All other components within normal limits  BASIC METABOLIC PANEL - Abnormal; Notable for the following components:   Potassium 3.3 (*)    Calcium 8.4 (*)    All other components within normal limits  POC URINE PREG, ED - Abnormal; Notable for the  following components:   Preg Test, Ur POSITIVE (*)    All other components within normal limits  CBC  ABO/RH  ANTIBODY SCREEN  RHOGAM INJECTION   ____________________________________________   ____________________________________________  RADIOLOGY  Ultrasound OB less than 14 weeks  ____________________________________________   PROCEDURES  Procedure(s) performed: No  Procedures    ____________________________________________   INITIAL IMPRESSION / ASSESSMENT AND PLAN / ED COURSE  Pertinent labs & imaging results that were available during my care of the patient were reviewed by me and considered in my medical decision making (see chart for details).   Patient is 35 year old female presents with vaginal bleeding in pregnancy.  See HPI.  Physical exam shows patient to be hemodynamically stable at this time  DDx: Subchorionic hemorrhage, subchorionic hematoma, threatened miscarriage, miscarriage, ectopic pregnancy  POC pregnancy is positive, CBC and metabolic panel are normal, ABO/Rh and beta-hCG are pending  Ultrasound OB less than 14 weeks  Ultrasound OB less than 14 weeks reviewed by me. Spoke with the radiologist concerning the ultrasound.  Patient has a incomplete abortion at this time.  No fetal heart tone.  Radiologist states that the fetal mass has decreased in size showing a nonviable IUP.  I did explain these findings to the patient.  She already has an appointment with [facility] for Thursday.  Consult to Dr. Bonney Aid from [facility].  He states he will see the patient in the office.  Patient has O- blood type, RhoGAM ordered here in the ED  Patient is in agreement with treatment plan at this time.  Will await discharge after patient has had RhoGAM.  She is to return emergency department if she has heavier bleeding and is becoming concerned about hemorrhaging.  We did discuss this in detail.  She is to keep her appointment on Thursday at [facility].     Haley Kramer was evaluated in Emergency Department on 01/14/2021 for the symptoms described in the history of present illness. She was evaluated in the context of the global COVID-19 pandemic, which necessitated consideration that the patient might be at risk for infection with the SARS-CoV-2 virus that causes COVID-19. Institutional protocols and algorithms that pertain to the evaluation of patients at risk for COVID-19 are in a state of rapid change based on information released by regulatory bodies including the CDC and federal and state organizations. These policies and algorithms were followed during the patient's care in the ED.    As part of my medical decision making, I reviewed the following data within the electronic MEDICAL RECORD NUMBER Nursing notes reviewed and incorporated, Labs reviewed , Old chart reviewed, Radiograph reviewed , A consult was requested and obtained from this/these consultant(s) OB/GYN, Notes from prior ED visits, and [place] Controlled Substance Database  ____________________________________________   FINAL CLINICAL IMPRESSION(S) / ED DIAGNOSES  Final diagnoses:  Vaginal bleeding  Incomplete spontaneous abortion      NEW MEDICATIONS STARTED DURING THIS VISIT:  Discharge Medication List as of 01/14/2021  1:28 PM       Note:  This document was prepared using Dragon voice recognition software and may include unintentional dictation errors.    Faythe Ghee, PA-C 01/14/21 1451    Georga Hacking, MD 01/14/21 1556

## 2021-01-14 NOTE — ED Notes (Signed)
See triage note  States she noticed some vaginal spotting on Friday  States this am she noticed heavy bleeding this am  States she passed a clot

## 2021-01-15 LAB — RHOGAM INJECTION: Unit division: 0

## 2021-01-17 ENCOUNTER — Ambulatory Visit (INDEPENDENT_AMBULATORY_CARE_PROVIDER_SITE_OTHER): Payer: Medicaid Other | Admitting: Obstetrics and Gynecology

## 2021-01-17 ENCOUNTER — Encounter: Payer: Self-pay | Admitting: Obstetrics and Gynecology

## 2021-01-17 ENCOUNTER — Other Ambulatory Visit: Payer: Self-pay

## 2021-01-17 DIAGNOSIS — O021 Missed abortion: Secondary | ICD-10-CM | POA: Diagnosis not present

## 2021-01-17 NOTE — H&P (View-Only) (Signed)
Obstetrics & Gynecology Office Visit   Chief Complaint:  Chief Complaint  Patient presents with   Follow-up    ED F/U Incomplete AB 01/14/21    History of Present Illness: 35 y.o. G85P1001 female who presents in ER follow up for a diagnosed missed abortion.  She presented to the ER on 9/5 after having had some vaginal spotting.  At the ER she diagnosed with a missed abortion. She had an pelvic ultrasound 2 weeks prior in the ER (8/22) that showed cardiac activity and a crown rump length of  6.1 mm consistent with a gestational age of [redacted]w[redacted]d and heart rate of 84 bpm. On 9/5, the CRL was 4.0 mm and there was no cardiac activity.  She has had some bleeding with small clots, no heavy bleeding.  She definitely wants a D&C.    Past Medical History:  Diagnosis Date   Incomplete abortion 01/14/2021    Past Surgical History:  Procedure Laterality Date   CESAREAN SECTION  03/22/2010   GASTRIC BYPASS      Gynecologic History: Patient's last menstrual period was 11/09/2020.  Obstetric History: G2P1001  Family History  Problem Relation Age of Onset   Hypertension Mother    Diabetes Mother    Hypertension Father    Diabetes Father    Hypertension Maternal Grandmother    Diabetes Maternal Grandmother    Hypertension Maternal Grandfather    Diabetes Maternal Grandfather     Social History   Socioeconomic History   Marital status: Single    Spouse name: Not on file   Number of children: Not on file   Years of education: Not on file   Highest education level: Not on file  Occupational History   Not on file  Tobacco Use   Smoking status: Never   Smokeless tobacco: Never  Vaping Use   Vaping Use: Never used  Substance and Sexual Activity   Alcohol use: No   Drug use: Never   Sexual activity: Yes    Partners: Male    Birth control/protection: None    Comment: hx mirena x 10 yr/removed 05/11/2020  Other Topics Concern   Not on file  Social History Narrative   Not on file    Social Determinants of Health   Financial Resource Strain: Not on file  Food Insecurity: Not on file  Transportation Needs: Not on file  Physical Activity: Not on file  Stress: Not on file  Social Connections: Not on file  Intimate Partner Violence: Not At Risk   Fear of Current or Ex-Partner: No   Emotionally Abused: No   Physically Abused: No   Sexually Abused: No    No Known Allergies  Prior to Admission medications   Medication Sig Start Date End Date Taking? Authorizing Provider  folic acid (FOLVITE) 800 MCG tablet Take 400 mcg by mouth daily.   Yes [provider]  IRON-B12-VIT C-FA-IFC PO Take by mouth.   Yes [provider]  Prenatal Vit-Fe Fumarate-FA (MULTIVITAMIN-PRENATAL) 27-0.8 MG TABS tablet Take 1 tablet by mouth daily at 12 noon. 12/19/20 03/29/21 Yes Federico Flake, MD    Review of Systems  Constitutional: Negative.   HENT: Negative.    Eyes: Negative.   Respiratory: Negative.    Cardiovascular: Negative.   Gastrointestinal: Negative.   Genitourinary: Negative.   Musculoskeletal: Negative.   Skin: Negative.   Neurological: Negative.   Psychiatric/Behavioral: Negative.      Physical Exam LMP 11/09/2020  Patient's last menstrual period was  11/09/2020. Physical Exam Constitutional:      General: She is not in acute distress.    Appearance: Normal appearance. She is well-developed.  HENT:     Head: Normocephalic and atraumatic.  Eyes:     General: No scleral icterus.    Conjunctiva/sclera: Conjunctivae normal.  Cardiovascular:     Rate and Rhythm: Normal rate and regular rhythm.     Heart sounds: No murmur heard.   No friction rub. No gallop.  Pulmonary:     Effort: Pulmonary effort is normal. No respiratory distress.     Breath sounds: Normal breath sounds. No wheezing or rales.  Abdominal:     General: Bowel sounds are normal. There is no distension.     Palpations: Abdomen is soft. There is no mass.     Tenderness:  There is no abdominal tenderness. There is no guarding or rebound.  Musculoskeletal:        General: Normal range of motion.     Cervical back: Normal range of motion and neck supple.  Neurological:     General: No focal deficit present.     Mental Status: She is alert and oriented to person, place, and time.     Cranial Nerves: No cranial nerve deficit.  Skin:    General: Skin is warm and dry.     Findings: No erythema.  Psychiatric:        Mood and Affect: Mood normal.        Behavior: Behavior normal.        Judgment: Judgment normal.    Female chaperone present for pelvic and breast  portions of the physical exam  Assessment: 35 y.o. G38P1001 female here for  1. Missed abortion      Plan: Problem List Items Addressed This Visit   None Visit Diagnoses     Missed abortion    -  Primary      Condolences were offered to the patient and her family.  I stressed that while emotionally difficult, that this did not occur because of an actions or inactions by the patient.  Somewhere between 10-20% of identified first trimester pregnancies will unfortunately end in miscarriage.  Given this relatively high incidence rate, further diagnostic testing such as chromosome analysis is generally not clinically relevant nor recommended.  Although the chromosomal abnormalities have been implicated at rates as high as 70% in some studies, these are generally random and do not infer and increased risk of recurrence with subsequent pregnancies.  However, 3 or more consecutive first trimester losses are relatively uncommon, and these patient generally do benefit from additional work up to determine a potential modifiable etiology.   We briefly discussed management options including expectant management, medical management, and surgical management as well as their relative success rates and complications. Approximately 80% of first trimester miscarriages will pass successfully but may require a time frame  of up to 8 weeks (ACOG Practice Bulletin 150 May 2015 "Early Pregnancy Loss").  Medical management using of misoprostil administered every 3-hrs as needed for up to 3 doses speeds up the time frame to completion significantly, has literature supporting its use up to 63 days or [redacted]w[redacted]d gestation and results in a passage rate of 84-85% (ACOG Practice Bulletin 143 March 2014 "Medical Management of First-Trimester Abortion").  Dilation and curettage has the highest rate of uterine evacuation, but carries with is operative cost, surgical and anesthetic risk.  While these risk are relatively small they nevertheless include infection, bleeding, uterine  perforation, formation of uterine synechia, and in rare cases death.   We discussed repeat ultrasound and or trending HCG levels if the patient wishes to pursue these prior to making her decision.  Clinically I am confident of the diagnosis, but I do not want any doubts in the patient's mind regarding the plan of management she chooses to adopt.  I will allow the patient and her family to discuss management options and she was advised to contact the office to arrange final disposition one she has made her decision or should she have any follow up questions for myself.    The patient is Rh negatie and rhogam is therefore is indicated.  She states that she received a Rhogam injection in the ER.   She elects a D&C and I will arrange for as soon as possible.    Thomasene Mohair, MD 01/17/2021 10:25 AM

## 2021-01-17 NOTE — Progress Notes (Signed)
Obstetrics & Gynecology Office Visit   Chief Complaint:  Chief Complaint  Patient presents with   Follow-up    ED F/U Incomplete AB 01/14/21    History of Present Illness: 35 y.o. G85P1001 female who presents in ER follow up for a diagnosed missed abortion.  She presented to the ER on 9/5 after having had some vaginal spotting.  At the ER she diagnosed with a missed abortion. She had an pelvic ultrasound 2 weeks prior in the ER (8/22) that showed cardiac activity and a crown rump length of  6.1 mm consistent with a gestational age of [redacted]w[redacted]d and heart rate of 84 bpm. On 9/5, the CRL was 4.0 mm and there was no cardiac activity.  She has had some bleeding with small clots, no heavy bleeding.  She definitely wants a D&C.    Past Medical History:  Diagnosis Date   Incomplete abortion 01/14/2021    Past Surgical History:  Procedure Laterality Date   CESAREAN SECTION  03/22/2010   GASTRIC BYPASS      Gynecologic History: Patient's last menstrual period was 11/09/2020.  Obstetric History: G2P1001  Family History  Problem Relation Age of Onset   Hypertension Mother    Diabetes Mother    Hypertension Father    Diabetes Father    Hypertension Maternal Grandmother    Diabetes Maternal Grandmother    Hypertension Maternal Grandfather    Diabetes Maternal Grandfather     Social History   Socioeconomic History   Marital status: Single    Spouse name: Not on file   Number of children: Not on file   Years of education: Not on file   Highest education level: Not on file  Occupational History   Not on file  Tobacco Use   Smoking status: Never   Smokeless tobacco: Never  Vaping Use   Vaping Use: Never used  Substance and Sexual Activity   Alcohol use: No   Drug use: Never   Sexual activity: Yes    Partners: Male    Birth control/protection: None    Comment: hx mirena x 10 yr/removed 05/11/2020  Other Topics Concern   Not on file  Social History Narrative   Not on file    Social Determinants of Health   Financial Resource Strain: Not on file  Food Insecurity: Not on file  Transportation Needs: Not on file  Physical Activity: Not on file  Stress: Not on file  Social Connections: Not on file  Intimate Partner Violence: Not At Risk   Fear of Current or Ex-Partner: No   Emotionally Abused: No   Physically Abused: No   Sexually Abused: No    No Known Allergies  Prior to Admission medications   Medication Sig Start Date End Date Taking? Authorizing Provider  folic acid (FOLVITE) 800 MCG tablet Take 400 mcg by mouth daily.   Yes [provider]  IRON-B12-VIT C-FA-IFC PO Take by mouth.   Yes [provider]  Prenatal Vit-Fe Fumarate-FA (MULTIVITAMIN-PRENATAL) 27-0.8 MG TABS tablet Take 1 tablet by mouth daily at 12 noon. 12/19/20 03/29/21 Yes Federico Flake, MD    Review of Systems  Constitutional: Negative.   HENT: Negative.    Eyes: Negative.   Respiratory: Negative.    Cardiovascular: Negative.   Gastrointestinal: Negative.   Genitourinary: Negative.   Musculoskeletal: Negative.   Skin: Negative.   Neurological: Negative.   Psychiatric/Behavioral: Negative.      Physical Exam LMP 11/09/2020  Patient's last menstrual period was  11/09/2020. Physical Exam Constitutional:      General: She is not in acute distress.    Appearance: Normal appearance. She is well-developed.  HENT:     Head: Normocephalic and atraumatic.  Eyes:     General: No scleral icterus.    Conjunctiva/sclera: Conjunctivae normal.  Cardiovascular:     Rate and Rhythm: Normal rate and regular rhythm.     Heart sounds: No murmur heard.   No friction rub. No gallop.  Pulmonary:     Effort: Pulmonary effort is normal. No respiratory distress.     Breath sounds: Normal breath sounds. No wheezing or rales.  Abdominal:     General: Bowel sounds are normal. There is no distension.     Palpations: Abdomen is soft. There is no mass.     Tenderness:  There is no abdominal tenderness. There is no guarding or rebound.  Musculoskeletal:        General: Normal range of motion.     Cervical back: Normal range of motion and neck supple.  Neurological:     General: No focal deficit present.     Mental Status: She is alert and oriented to person, place, and time.     Cranial Nerves: No cranial nerve deficit.  Skin:    General: Skin is warm and dry.     Findings: No erythema.  Psychiatric:        Mood and Affect: Mood normal.        Behavior: Behavior normal.        Judgment: Judgment normal.    Female chaperone present for pelvic and breast  portions of the physical exam  Assessment: 35 y.o. G38P1001 female here for  1. Missed abortion      Plan: Problem List Items Addressed This Visit   None Visit Diagnoses     Missed abortion    -  Primary      Condolences were offered to the patient and her family.  I stressed that while emotionally difficult, that this did not occur because of an actions or inactions by the patient.  Somewhere between 10-20% of identified first trimester pregnancies will unfortunately end in miscarriage.  Given this relatively high incidence rate, further diagnostic testing such as chromosome analysis is generally not clinically relevant nor recommended.  Although the chromosomal abnormalities have been implicated at rates as high as 70% in some studies, these are generally random and do not infer and increased risk of recurrence with subsequent pregnancies.  However, 3 or more consecutive first trimester losses are relatively uncommon, and these patient generally do benefit from additional work up to determine a potential modifiable etiology.   We briefly discussed management options including expectant management, medical management, and surgical management as well as their relative success rates and complications. Approximately 80% of first trimester miscarriages will pass successfully but may require a time frame  of up to 8 weeks (ACOG Practice Bulletin 150 May 2015 "Early Pregnancy Loss").  Medical management using of misoprostil administered every 3-hrs as needed for up to 3 doses speeds up the time frame to completion significantly, has literature supporting its use up to 63 days or [redacted]w[redacted]d gestation and results in a passage rate of 84-85% (ACOG Practice Bulletin 143 March 2014 "Medical Management of First-Trimester Abortion").  Dilation and curettage has the highest rate of uterine evacuation, but carries with is operative cost, surgical and anesthetic risk.  While these risk are relatively small they nevertheless include infection, bleeding, uterine  perforation, formation of uterine synechia, and in rare cases death.   We discussed repeat ultrasound and or trending HCG levels if the patient wishes to pursue these prior to making her decision.  Clinically I am confident of the diagnosis, but I do not want any doubts in the patient's mind regarding the plan of management she chooses to adopt.  I will allow the patient and her family to discuss management options and she was advised to contact the office to arrange final disposition one she has made her decision or should she have any follow up questions for myself.    The patient is Rh negatie and rhogam is therefore is indicated.  She states that she received a Rhogam injection in the ER.   She elects a D&C and I will arrange for as soon as possible.    Thomasene Mohair, MD 01/17/2021 10:25 AM

## 2021-01-18 ENCOUNTER — Encounter
Admission: RE | Admit: 2021-01-18 | Discharge: 2021-01-18 | Disposition: A | Discharge status: Home / Self Care | Payer: Medicaid Other | Source: Ambulatory Visit | Attending: Obstetrics and Gynecology | Admitting: Obstetrics and Gynecology

## 2021-01-18 ENCOUNTER — Other Ambulatory Visit: Payer: Self-pay | Admitting: Obstetrics and Gynecology

## 2021-01-18 DIAGNOSIS — O219 Vomiting of pregnancy, unspecified: Secondary | ICD-10-CM

## 2021-01-18 HISTORY — DX: Obesity, unspecified: E66.9

## 2021-01-18 MED ORDER — ONDANSETRON 4 MG PO TBDP: 4.0000 mg | ORAL_TABLET | Freq: Four times a day (QID) | ORAL | 0 refills | Status: DC | PRN

## 2021-01-18 NOTE — Patient Instructions (Signed)
Your procedure is scheduled on: Tuesday, September 13 Report to the Registration Desk on the 1st floor of the [facility] To find out your arrival time, please call [phone] between 1PM - 3PM on: Monday, September 12  REMEMBER: Instructions that are not followed completely may result in serious medical risk, up to and including death; or upon the discretion of your surgeon and anesthesiologist your surgery may need to be rescheduled.  Do not eat food after midnight the night before surgery.  No gum chewing, lozengers or hard candies.  You may however, drink CLEAR liquids up to 2 hours before you are scheduled to arrive for your surgery. Do not drink anything within 2 hours of your scheduled arrival time.  Clear liquids include: - water  - apple juice without pulp - gatorade (not RED, PURPLE, OR BLUE) - black coffee or tea (Do NOT add milk or creamers to the coffee or tea) Do NOT drink anything that is not on this list.  DO NOT TAKE ANY MEDICATIONS THE MORNING OF SURGERY   One week prior to surgery: Stop Anti-inflammatories (NSAIDS) such as Advil, Aleve, Ibuprofen, Motrin, Naproxen, Naprosyn and Aspirin based products such as Excedrin, Goodys Powder, BC Powder. Stop ANY OVER THE COUNTER supplements until after surgery. You may however, continue to take Tylenol if needed for pain up until the day of surgery.  No Alcohol for 24 hours before or after surgery.  No Smoking including e-cigarettes for 24 hours prior to surgery.  No chewable tobacco products for at least 6 hours prior to surgery.  No nicotine patches on the day of surgery.  Do not use any "recreational" drugs for at least a week prior to your surgery.  Please be advised that the combination of cocaine and anesthesia may have negative outcomes, up to and including death. If you test positive for cocaine, your surgery will be cancelled.  On the morning of surgery brush your teeth with toothpaste and water, you may  rinse your mouth with mouthwash if you wish. Do not swallow any toothpaste or mouthwash.  Do not wear jewelry, make-up, hairpins, clips or nail polish.  Do not wear lotions, powders, or perfumes.   Do not shave body from the neck down 48 hours prior to surgery just in case you cut yourself which could leave a site for infection.   Contact lenses, hearing aids and dentures may not be worn into surgery.  Do not bring valuables to the hospital. [facility] is not responsible for any missing/lost belongings or valuables.   Notify your doctor if there is any change in your medical condition (cold, fever, infection).  Wear comfortable clothing (specific to your surgery type) to the hospital.  After surgery, you can help prevent lung complications by doing breathing exercises.  Take deep breaths and cough every 1-2 hours. Your doctor may order a device called an Incentive Spirometer to help you take deep breaths.  If you are being discharged the day of surgery, you will not be allowed to drive home. You will need a responsible adult (18 years or older) to drive you home and stay with you that night.   If you are taking public transportation, you will need to have a responsible adult (18 years or older) with you. Please confirm with your physician that it is acceptable to use public transportation.   Please call the Pre-admissions Testing Dept. at [phone] if you have any questions about these instructions.  Surgery Visitation Policy:  Patients undergoing a surgery or procedure may have one family member or support person with them as long as that person is not COVID-19 positive or experiencing its symptoms.  That person may remain in the waiting area during the procedure.

## 2021-01-18 NOTE — Patient Instructions (Signed)
Your procedure is scheduled on: Report to the Registration Desk on the 1st floor [facility] To find out your arrival time, please call [phone] between 1PM - 3PM on:  REMEMBER: Instructions that are not followed completely may result in serious medical risk, up to and including death; or upon the discretion of your surgeon and anesthesiologist your surgery may need to be rescheduled.  Do not eat food after midnight the night before surgery.  No gum chewing, lozengers or hard candies.  You may however, drink CLEAR liquids up to 2 hours before you are scheduled to arrive for your surgery. Do not drink anything within 2 hours of your scheduled arrival time.  Clear liquids include: - water  - apple juice without pulp - gatorade (not RED, PURPLE, OR BLUE) - black coffee or tea (Do NOT add milk or creamers to the coffee or tea) Do NOT drink anything that is not on this list.  Type 1 and Type 2 diabetics should only drink water.  In addition, your doctor has ordered for you to drink the provided  Ensure Pre-Surgery Clear Carbohydrate Drink  Gatorade G2 Drinking this carbohydrate drink up to two hours before surgery helps to reduce insulin resistance and improve patient outcomes. Please complete drinking 2 hours prior to scheduled arrival time.  TAKE THESE MEDICATIONS THE MORNING OF SURGERY WITH A SIP OF WATER:  (take one the night before and one on the morning of surgery - helps to prevent nausea after surgery.)  Use inhalers on the day of surgery and bring to the hospital.  Stop Metformin and Janumet 2 days prior to surgery.  Take 1/2 of usual insulin dose the night before surgery and none on the morning of surgery.  Follow recommendations from Cardiologist, Pulmonologist or PCP regarding stopping Aspirin, Coumadin, Plavix, Eliquis, Pradaxa, or Pletal.  One week prior to surgery: Stop Anti-inflammatories (NSAIDS) such as Advil, Aleve, Ibuprofen, Motrin, Naproxen,  Naprosyn and Aspirin based products such as Excedrin, Goodys Powder, BC Powder. Stop ANY OVER THE COUNTER supplements until after surgery. You may however, continue to take Tylenol if needed for pain up until the day of surgery.  No Alcohol for 24 hours before or after surgery.  No Smoking including e-cigarettes for 24 hours prior to surgery.  No chewable tobacco products for at least 6 hours prior to surgery.  No nicotine patches on the day of surgery.  Do not use any "recreational" drugs for at least a week prior to your surgery.  Please be advised that the combination of cocaine and anesthesia may have negative outcomes, up to and including death. If you test positive for cocaine, your surgery will be cancelled.  On the morning of surgery brush your teeth with toothpaste and water, you may rinse your mouth with mouthwash if you wish. Do not swallow any toothpaste or mouthwash.  Do not wear jewelry, make-up, hairpins, clips or nail polish.  Do not wear lotions, powders, or perfumes.   Do not shave body from the neck down 48 hours prior to surgery just in case you cut yourself which could leave a site for infection.  Also, freshly shaved skin may become irritated if using the CHG soap.  Contact lenses, hearing aids and dentures may not be worn into surgery.  Do not bring valuables to the hospital. [facility] is not responsible for any missing/lost belongings or valuables.   Use CHG Soap or wipes as directed on instruction sheet.  Total Shoulder Arthroplasty:    use Benzolyl Peroxide 5% Gel as directed on instruction sheet.  Fleets enema or Magnesium Citrate as directed.  Bring your C-PAP to the hospital with you in case you may have to spend the night.   Notify your doctor if there is any change in your medical condition (cold, fever, infection).  Wear comfortable clothing (specific to your surgery type) to the hospital.  After surgery, you can help prevent lung complications  by doing breathing exercises.  Take deep breaths and cough every 1-2 hours. Your doctor may order a device called an Incentive Spirometer to help you take deep breaths. When coughing or sneezing, hold a pillow firmly against your incision with both hands. This is called "splinting." Doing this helps protect your incision. It also decreases belly discomfort.  If you are being admitted to the hospital overnight, leave your suitcase in the car. After surgery it may be brought to your room.  If you are being discharged the day of surgery, you will not be allowed to drive home. You will need a responsible adult (18 years or older) to drive you home and stay with you that night.   If you are taking public transportation, you will need to have a responsible adult (18 years or older) with you. Please confirm with your physician that it is acceptable to use public transportation.   Please call the Pre-admissions Testing Dept. at [phone] if you have any questions about these instructions.  Surgery Visitation Policy:  Patients undergoing a surgery or procedure may have one family member or support person with them as long as that person is not COVID-19 positive or experiencing its symptoms.  That person may remain in the waiting area during the procedure.  Inpatient Visitation:    Visiting hours are 7 a.m. to 8 p.m. Inpatients will be allowed two visitors daily. The visitors may change each day during the patient's stay. No visitors under the age of 12. Any visitor under the age of 18 must be accompanied by an adult. The visitor must pass COVID-19 screenings, use hand sanitizer when entering and exiting the patient's room and wear a mask at all times, including in the patient's room. Patients must also wear a mask when staff or their visitor are in the room. Masking is required regardless of vaccination status.  

## 2021-01-18 NOTE — Telephone Encounter (Signed)
-----   Message from Conard Novak, MD sent at 01/17/2021 10:26 AM EDT ----- Regarding: Schedule Surgery 9/13! Surgery Booking Request Patient Full Name:  Haley Kramer  MRN: [id number]  DOB: Jan 06, 1986  Surgeon: Adelene Idler, MD  Requested Surgery Date and Time: 9/13/2-22 Primary Diagnosis AND Code: Missed Abortion (O02.1) Secondary Diagnosis and Code:  Surgical Procedure: Suction D&C RNFA Requested?: No L&D Notification: No Admission Status: same day surgery Length of Surgery: 50 min Special Case Needs: No H&P: day of surgery is probably OK. Check with Dr. Jerene Pitch. Patient counseled today in clinic and she could probably update my H&P.  Phone Interview???:  Yes Interpreter: No Medical Clearance:  No Special Scheduling Instructions: No Any known health/anesthesia issues, diabetes, sleep apnea, latex allergy, defibrillator/pacemaker?: No Acuity: P2   (P1 highest, P2 delay may cause harm, P3 low, elective gyn, P4 lowest) Post op follow up visits: 2 weeks post-op with Dr. Jerene Pitch

## 2021-01-18 NOTE — Telephone Encounter (Signed)
Called patient to schedule suction d&c w [facility]  DOS 9/13  H&P day of  Pre-admit phone call appointment to be requested. Also all appointments will be updated on pt MyChart. Explained that this appointment has a call window. Based on the time scheduled will indicate if the call will be received within a 4 hour window before 1:00 or after.  Advised that pt may also receive calls from the hospital pharmacy and pre-service center.  Confirmed pt has [employer] as primary insurance. No secondary insurance.   I do see where she asked about taking Zofran in an earlier My Chart msg she sent to [facility].

## 2021-01-21 ENCOUNTER — Inpatient Hospital Stay
Admission: RE | Admit: 2021-01-21 | Discharge: 2021-01-21 | Disposition: A | Discharge status: Home / Self Care | Payer: Medicaid Other | Source: Ambulatory Visit

## 2021-01-21 ENCOUNTER — Encounter
Admission: RE | Admit: 2021-01-21 | Discharge: 2021-01-21 | Disposition: A | Discharge status: Home / Self Care | Payer: Medicaid Other | Source: Ambulatory Visit | Attending: Obstetrics and Gynecology | Admitting: Obstetrics and Gynecology

## 2021-01-21 ENCOUNTER — Encounter: Payer: Self-pay | Admitting: Urgent Care

## 2021-01-21 ENCOUNTER — Other Ambulatory Visit: Payer: Self-pay

## 2021-01-21 DIAGNOSIS — Z01812 Encounter for preprocedural laboratory examination: Secondary | ICD-10-CM | POA: Insufficient documentation

## 2021-01-21 HISTORY — DX: Nausea with vomiting, unspecified: R11.2

## 2021-01-21 HISTORY — DX: Other specified postprocedural states: Z98.890

## 2021-01-21 LAB — POTASSIUM: Potassium: 3.5 mmol/L (ref 3.5–5.1)

## 2021-01-22 ENCOUNTER — Encounter
Admission: RE | Disposition: A | Discharge status: Home / Self Care | Payer: Self-pay | Source: Home / Self Care | Attending: Obstetrics and Gynecology

## 2021-01-22 ENCOUNTER — Encounter: Payer: Self-pay | Admitting: Obstetrics and Gynecology

## 2021-01-22 ENCOUNTER — Ambulatory Visit: Payer: Medicaid Other | Admitting: Urgent Care

## 2021-01-22 ENCOUNTER — Ambulatory Visit
Admission: RE | Admit: 2021-01-22 | Discharge: 2021-01-22 | Disposition: A | Discharge status: Home / Self Care | Payer: Medicaid Other | Attending: Obstetrics and Gynecology | Admitting: Obstetrics and Gynecology

## 2021-01-22 DIAGNOSIS — Z9884 Bariatric surgery status: Secondary | ICD-10-CM | POA: Insufficient documentation

## 2021-01-22 DIAGNOSIS — O021 Missed abortion: Secondary | ICD-10-CM

## 2021-01-22 HISTORY — PX: DILATION AND EVACUATION: SHX1459

## 2021-01-22 LAB — CBC
HCT: 34 % — ABNORMAL LOW (ref 36.0–46.0)
Hemoglobin: 11.6 g/dL — ABNORMAL LOW (ref 12.0–15.0)
MCH: 28.6 pg (ref 26.0–34.0)
MCHC: 34.1 g/dL (ref 30.0–36.0)
MCV: 83.7 fL (ref 80.0–100.0)
Platelets: 242 10*3/uL (ref 150–400)
RBC: 4.06 MIL/uL (ref 3.87–5.11)
RDW: 15.1 % (ref 11.5–15.5)
WBC: 10.6 10*3/uL — ABNORMAL HIGH (ref 4.0–10.5)
nRBC: 0 % (ref 0.0–0.2)

## 2021-01-22 LAB — TYPE AND SCREEN
ABO/RH(D): O NEG
Antibody Screen: POSITIVE
Extend sample reason: UNDETERMINED

## 2021-01-22 SURGERY — DILATION AND EVACUATION, UTERUS
Anesthesia: General | Site: Uterus

## 2021-01-22 MED ORDER — APREPITANT 40 MG PO CAPS
ORAL_CAPSULE | ORAL | Status: AC
  Administered 2021-01-22: 40 mg via ORAL
  Filled 2021-01-22: qty 1

## 2021-01-22 MED ORDER — 0.9 % SODIUM CHLORIDE (POUR BTL) OPTIME
TOPICAL | Status: DC | PRN
  Administered 2021-01-22: 500 mL

## 2021-01-22 MED ORDER — PROPOFOL 1000 MG/100ML IV EMUL
INTRAVENOUS | Status: AC
  Filled 2021-01-22: qty 100

## 2021-01-22 MED ORDER — APREPITANT 40 MG PO CAPS: 40.0000 mg | ORAL_CAPSULE | Freq: Once | ORAL | Status: AC

## 2021-01-22 MED ORDER — PROMETHAZINE HCL 25 MG/ML IJ SOLN: 6.2500 mg | INTRAMUSCULAR | Status: DC | PRN

## 2021-01-22 MED ORDER — POVIDONE-IODINE 10 % EX SWAB: 2.0000 "application " | Freq: Once | CUTANEOUS | Status: DC

## 2021-01-22 MED ORDER — FENTANYL CITRATE (PF) 100 MCG/2ML IJ SOLN
25.0000 ug | INTRAMUSCULAR | Status: DC | PRN
  Administered 2021-01-22: 50 ug via INTRAVENOUS

## 2021-01-22 MED ORDER — CHLORHEXIDINE GLUCONATE 0.12 % MT SOLN
OROMUCOSAL | Status: AC
  Administered 2021-01-22: 15 mL via OROMUCOSAL
  Filled 2021-01-22: qty 15

## 2021-01-22 MED ORDER — CHLORHEXIDINE GLUCONATE 0.12 % MT SOLN: 15.0000 mL | Freq: Once | OROMUCOSAL | Status: AC

## 2021-01-22 MED ORDER — PROPOFOL 500 MG/50ML IV EMUL
INTRAVENOUS | Status: DC | PRN
Start: 2021-01-22 — End: 2021-01-22
  Administered 2021-01-22: 200 ug/kg/min via INTRAVENOUS

## 2021-01-22 MED ORDER — FENTANYL CITRATE (PF) 100 MCG/2ML IJ SOLN
INTRAMUSCULAR | Status: AC
  Filled 2021-01-22: qty 2

## 2021-01-22 MED ORDER — OXYCODONE HCL 5 MG/5ML PO SOLN
5.0000 mg | Freq: Once | ORAL | Status: DC | PRN
Start: 2021-01-22 — End: 2021-01-22

## 2021-01-22 MED ORDER — FAMOTIDINE 20 MG PO TABS: 20.0000 mg | ORAL_TABLET | Freq: Once | ORAL | Status: AC

## 2021-01-22 MED ORDER — FENTANYL CITRATE (PF) 100 MCG/2ML IJ SOLN
INTRAMUSCULAR | Status: AC
  Administered 2021-01-22: 50 ug via INTRAVENOUS
  Filled 2021-01-22: qty 2

## 2021-01-22 MED ORDER — ONDANSETRON HCL 4 MG/2ML IJ SOLN
INTRAMUSCULAR | Status: DC | PRN
Start: 2021-01-22 — End: 2021-01-22
  Administered 2021-01-22: 4 mg via INTRAVENOUS

## 2021-01-22 MED ORDER — LIDOCAINE HCL (CARDIAC) PF 100 MG/5ML IV SOSY
PREFILLED_SYRINGE | INTRAVENOUS | Status: DC | PRN
  Administered 2021-01-22: 100 mg via INTRAVENOUS

## 2021-01-22 MED ORDER — PROPOFOL 10 MG/ML IV BOLUS
INTRAVENOUS | Status: DC | PRN
  Administered 2021-01-22: 160 mg via INTRAVENOUS

## 2021-01-22 MED ORDER — DEXAMETHASONE SODIUM PHOSPHATE 10 MG/ML IJ SOLN
INTRAMUSCULAR | Status: DC | PRN
  Administered 2021-01-22: 5 mg via INTRAVENOUS

## 2021-01-22 MED ORDER — LACTATED RINGERS IV SOLN: INTRAVENOUS | Status: DC

## 2021-01-22 MED ORDER — TRAMADOL HCL 50 MG PO TABS: 50.0000 mg | ORAL_TABLET | Freq: Four times a day (QID) | ORAL | 0 refills | Status: DC | PRN

## 2021-01-22 MED ORDER — DOXYCYCLINE HYCLATE 100 MG IV SOLR
200.0000 mg | INTRAVENOUS | Status: AC
  Administered 2021-01-22: 200 mg via INTRAVENOUS
  Filled 2021-01-22: qty 200

## 2021-01-22 MED ORDER — OXYCODONE HCL 5 MG PO TABS: 5.0000 mg | ORAL_TABLET | Freq: Once | ORAL | Status: DC | PRN

## 2021-01-22 MED ORDER — MIDAZOLAM HCL 2 MG/2ML IJ SOLN
INTRAMUSCULAR | Status: AC
  Filled 2021-01-22: qty 2

## 2021-01-22 MED ORDER — ORAL CARE MOUTH RINSE: 15.0000 mL | Freq: Once | OROMUCOSAL | Status: AC

## 2021-01-22 MED ORDER — MIDAZOLAM HCL 2 MG/2ML IJ SOLN
INTRAMUSCULAR | Status: DC | PRN
  Administered 2021-01-22: 2 mg via INTRAVENOUS

## 2021-01-22 MED ORDER — SEVOFLURANE IN SOLN
RESPIRATORY_TRACT | Status: AC
  Filled 2021-01-22: qty 250

## 2021-01-22 MED ORDER — FAMOTIDINE 20 MG PO TABS
ORAL_TABLET | ORAL | Status: AC
  Administered 2021-01-22: 20 mg via ORAL
  Filled 2021-01-22: qty 1

## 2021-01-22 MED ORDER — MEPERIDINE HCL 25 MG/ML IJ SOLN: 6.2500 mg | INTRAMUSCULAR | Status: DC | PRN

## 2021-01-22 MED ORDER — IBUPROFEN 600 MG PO TABS: 600.0000 mg | ORAL_TABLET | Freq: Four times a day (QID) | ORAL | 0 refills | Status: DC | PRN

## 2021-01-22 SURGICAL SUPPLY — 26 items
BAG COUNTER SPONGE SURGICOUNT (BAG) ×1 IMPLANT
BAG SPNG CNTER NS LX DISP (BAG)
CATH ROBINSON RED A/P 16FR (CATHETERS) IMPLANT
FILTER UTR ASPR SPEC (MISCELLANEOUS) ×1 IMPLANT
FLTR UTR ASPR SPEC (MISCELLANEOUS) ×2
GAUZE 4X4 16PLY ~~LOC~~+RFID DBL (SPONGE) ×2 IMPLANT
GLOVE SURG SYN 6.5 ES PF (GLOVE) ×4 IMPLANT
GLOVE SURG SYN 6.5 PF PI (GLOVE) ×2 IMPLANT
GLOVE SURG UNDER POLY LF SZ6.5 (GLOVE) ×4 IMPLANT
GOWN STRL REUS W/ TWL LRG LVL3 (GOWN DISPOSABLE) ×2 IMPLANT
GOWN STRL REUS W/TWL LRG LVL3 (GOWN DISPOSABLE) ×4
KIT BERKELEY 1ST TRIMESTER 3/8 (MISCELLANEOUS) ×1 IMPLANT
KIT TURNOVER CYSTO (KITS) ×2 IMPLANT
MANIFOLD NEPTUNE II (INSTRUMENTS) ×2 IMPLANT
NS IRRIG 500ML POUR BTL (IV SOLUTION) ×2 IMPLANT
PACK DNC HYST (MISCELLANEOUS) ×2 IMPLANT
PAD OB MATERNITY 4.3X12.25 (PERSONAL CARE ITEMS) ×2 IMPLANT
PAD PREP 24X41 OB/GYN DISP (PERSONAL CARE ITEMS) ×2 IMPLANT
SCRUB EXIDINE 4% CHG 4OZ (MISCELLANEOUS) ×2 IMPLANT
SET BERKELEY SUCTION TUBING (SUCTIONS) ×2 IMPLANT
TOWEL OR 17X26 4PK STRL BLUE (TOWEL DISPOSABLE) ×2 IMPLANT
VACURETTE 10 RIGID CVD (CANNULA) IMPLANT
VACURETTE 12 RIGID CVD (CANNULA) IMPLANT
VACURETTE 8 RIGID CVD (CANNULA) IMPLANT
VACURETTE 8MM F TIP (MISCELLANEOUS) ×2 IMPLANT
WATER STERILE IRR 500ML POUR (IV SOLUTION) ×1 IMPLANT

## 2021-01-22 NOTE — Op Note (Signed)
  Operative Note  01/22/2021 2:58 PM  PRE-OP DIAGNOSIS: Missed abortion O02.1   POST-OP DIAGNOSIS: same  SURGEON: Tehani Mersman MD  ANESTHESIA: Choice   PROCEDURE: Procedure(s): DILATATION AND EVACUATION   ESTIMATED BLOOD LOSS:  100 cc   SPECIMENS: POC   COMPLICATIONS: none  DISPOSITION: PACU - hemodynamically stable.  CONDITION: stable  FINDINGS: Exam under anesthesia revealed a 10 wk size uterus without palpable adnexal masses.   INDICATION FOR PROCEDURE: missed abortion  PROCEDURE IN DETAIL: After informed consent was obtained, the patient was taken to the operating room where anesthesia was obtained without difficulty. The patient was positioned in the dorsal lithotomy position with [employer]. Time out was performed and an exam under anesthesia was performed. The vagina, perineum, and lower abdomen were prepped and draped in a normal sterile fashion. The bladder was emptied with an I&O catheter. A speculum was placed into the vagina and the cervix was grasped with a single toothed tenaculum.  The cervix was gently dilated to 4 [place] with  [employer] dilators. The suction was then tested and found to be adequate, and a 8 flexible suction cannula was advanced into the uterine cavity. The suction was activated and the contents of the uterus were aspirated until no further tissue was obtained. The uterus was then curetted to gritty texture throughout.  At the end of the procedure bleeding was noted to be minimal.  All instruments were then removed from the vagina.The patient tolerated the procedure well. All sponge, instrument, and needle counts were correct. The patient was taken to the recovery room in good condition.   Adelene Idler MD [facility], [facility] 01/22/2021 2:58 PM

## 2021-01-22 NOTE — Anesthesia Preprocedure Evaluation (Signed)
Anesthesia Evaluation  Patient identified by MRN, date of birth, ID band Patient awake    Reviewed: Allergy & Precautions, NPO status , Patient's Chart, lab work & pertinent test results  History of Anesthesia Complications (+) PONV and history of anesthetic complications  Airway Mallampati: I  TM Distance: >3 FB Neck ROM: Full    Dental  (+) Missing   Pulmonary neg pulmonary ROS, neg sleep apnea, neg COPD,    breath sounds clear to auscultation- rhonchi (-) wheezing      Cardiovascular Exercise Tolerance: Good (-) hypertension(-) CAD, (-) Past MI, (-) Cardiac Stents and (-) CABG  Rhythm:Regular Rate:Normal - Systolic murmurs and - Diastolic murmurs    Neuro/Psych neg Seizures negative neurological ROS  negative psych ROS   GI/Hepatic negative GI ROS, Neg liver ROS,   Endo/Other  negative endocrine ROSneg diabetes  Renal/GU negative Renal ROS     Musculoskeletal negative musculoskeletal ROS (+)   Abdominal (+) + obese,   Peds  Hematology negative hematology ROS (+)   Anesthesia Other Findings Past Medical History: 01/14/2021: Incomplete abortion No date: Obesity No date: PONV (postoperative nausea and vomiting)   Reproductive/Obstetrics                             Anesthesia Physical Anesthesia Plan  ASA: 2  Anesthesia Plan: General   Post-op Pain Management:    Induction: Intravenous  PONV Risk Score and Plan: 3 and Ondansetron, Dexamethasone, Aprepitant, TIVA and Midazolam  Airway Management Planned: LMA  Additional Equipment:   Intra-op Plan:   Post-operative Plan:   Informed Consent: I have reviewed the patients History and Physical, chart, labs and discussed the procedure including the risks, benefits and alternatives for the proposed anesthesia with the patient or authorized representative who has indicated his/her understanding and acceptance.     Dental  advisory given  Plan Discussed with: CRNA and Anesthesiologist  Anesthesia Plan Comments:         Anesthesia Quick Evaluation

## 2021-01-22 NOTE — Discharge Instructions (Addendum)
Patient can take Motrin and Tylenol every 6 hours as needed for pain.  If patient's pain is not controlled well with this medication she can take tramadol as needed.  Patient should not take tramadol for more than 7 days after the procedure.  No driving while taking tramadol.  Nothing in the vagina for 1 week after the surgery.AMBULATORY SURGERY  DISCHARGE INSTRUCTIONS   The drugs that you were given will stay in your system until tomorrow so for the next 24 hours you should not:  Drive an automobile Make any legal decisions Drink any alcoholic beverage   You may resume regular meals tomorrow.  Today it is better to start with liquids and gradually work up to solid foods.  You may eat anything you prefer, but it is better to start with liquids, then soup and crackers, and gradually work up to solid foods.   Please notify your doctor immediately if you have any unusual bleeding, trouble breathing, redness and pain at the surgery site, drainage, fever, or pain not relieved by medication.    Your post-operative visit with Dr.                                       is: Date:                        Time:    Please call to schedule your post-operative visit.  Additional Instructions:

## 2021-01-22 NOTE — Interval H&P Note (Signed)
History and Physical Interval Note:  01/22/2021 1:17 PM  Haley Kramer  has presented today for surgery, with the diagnosis of Missed abortion O02.1.  The various methods of treatment have been discussed with the patient and family. After consideration of risks, benefits and other options for treatment, the patient has consented to  Procedure(s): DILATATION AND EVACUATION (N/A) as a surgical intervention.  The patient's history has been reviewed, patient examined, no change in status, stable for surgery.  I have reviewed the patient's chart and labs.  Questions were answered to the patient's satisfaction.     Lovenia Debruler R Sheriann Newmann

## 2021-01-22 NOTE — OR Nursing (Signed)
Anora genetic testing kit brought to OR by Dr Gilman Schmidt. Dr Gilman Schmidt made aware that the testing kit was not approved for use at [facility]. Approved [facility] kit was offered. Dr Gilman Schmidt refused to use approved kit and opted to collect the genetic tissue sample and complete the Anora testing at her office.

## 2021-01-22 NOTE — Progress Notes (Signed)
Patient states she received rhogam injection 9/8.

## 2021-01-22 NOTE — Transfer of Care (Signed)
Immediate Anesthesia Transfer of Care Note  Patient: Haley Kramer  Procedure(s) Performed: DILATATION AND EVACUATION (Uterus)  Patient Location: PACU  Anesthesia Type:General  Level of Consciousness: awake, alert  and oriented  Airway & Oxygen Therapy: Patient Spontanous Breathing and Patient connected to face mask oxygen  Post-op Assessment: Report given to RN and Post -op Vital signs reviewed and stable  Post vital signs: Reviewed and stable  Last Vitals:  Vitals Value Taken Time  BP 121/77 01/22/21 1436  Temp    Pulse 81 01/22/21 1445  Resp 10 01/22/21 1445  SpO2 100 % 01/22/21 1445  Vitals shown include unvalidated device data.  Last Pain:  Vitals:   01/22/21 1436  TempSrc:   PainSc: Asleep      Patients Stated Pain Goal: 0 (01/22/21 1233)  Complications: No notable events documented.

## 2021-01-22 NOTE — Anesthesia Procedure Notes (Signed)
Procedure Name: LMA Insertion Date/Time: 01/22/2021 2:04 PM Performed by: Junious Silk, CRNA Pre-anesthesia Checklist: Patient identified, Patient being monitored, Timeout performed, Emergency Drugs available and Suction available Patient Re-evaluated:Patient Re-evaluated prior to induction Oxygen Delivery Method: Circle system utilized Preoxygenation: Pre-oxygenation with 100% oxygen Induction Type: IV induction Ventilation: Mask ventilation without difficulty LMA: LMA inserted LMA Size: 4.0 Tube type: Oral Number of attempts: 1 Placement Confirmation: positive ETCO2 and breath sounds checked- equal and bilateral Tube secured with: Tape Dental Injury: Teeth and Oropharynx as per pre-operative assessment

## 2021-01-23 ENCOUNTER — Encounter: Payer: Self-pay | Admitting: Obstetrics and Gynecology

## 2021-01-23 NOTE — Anesthesia Postprocedure Evaluation (Signed)
Anesthesia Post Note  Patient: Haley Kramer  Procedure(s) Performed: DILATATION AND EVACUATION (Uterus)  Patient location during evaluation: PACU Anesthesia Type: General Level of consciousness: awake and alert and oriented Pain management: pain level controlled Vital Signs Assessment: post-procedure vital signs reviewed and stable Respiratory status: spontaneous breathing, nonlabored ventilation and respiratory function stable Cardiovascular status: blood pressure returned to baseline and stable Postop Assessment: no signs of nausea or vomiting Anesthetic complications: no   No notable events documented.   Last Vitals:  Vitals:   01/22/21 1545 01/22/21 1556  BP: 115/75 118/73  Pulse:  88  Resp:  18  Temp: 37.1 C 36.9 C  SpO2:  99%    Last Pain:  Vitals:   01/22/21 1556  TempSrc: Temporal  PainSc: 2                  Daniil Labarge

## 2021-01-24 LAB — SURGICAL PATHOLOGY

## 2021-01-31 ENCOUNTER — Telehealth: Payer: Self-pay

## 2021-01-31 NOTE — Telephone Encounter (Signed)
Haley Kramer from [facility] calling to see what type solution was used in cup.  Pt's case # [id number]  [phone]

## 2021-01-31 NOTE — Telephone Encounter (Signed)
Returned call- confirmed specimen in normal saline

## 2021-02-04 ENCOUNTER — Ambulatory Visit: Payer: Medicaid Other | Admitting: Obstetrics and Gynecology

## 2021-02-07 NOTE — Telephone Encounter (Signed)
Patient is returning missed call. Please advise 

## 2021-02-25 ENCOUNTER — Other Ambulatory Visit: Payer: Self-pay

## 2021-02-25 ENCOUNTER — Ambulatory Visit (INDEPENDENT_AMBULATORY_CARE_PROVIDER_SITE_OTHER): Payer: Medicaid Other | Admitting: Obstetrics and Gynecology

## 2021-02-25 ENCOUNTER — Encounter: Payer: Self-pay | Admitting: Obstetrics and Gynecology

## 2021-02-25 VITALS — BP 112/70 | Ht 67.0 in | Wt 197.4 lb

## 2021-02-25 DIAGNOSIS — N760 Acute vaginitis: Secondary | ICD-10-CM

## 2021-02-25 DIAGNOSIS — O3680X0 Pregnancy with inconclusive fetal viability, not applicable or unspecified: Secondary | ICD-10-CM

## 2021-02-25 DIAGNOSIS — Z4889 Encounter for other specified surgical aftercare: Secondary | ICD-10-CM

## 2021-02-25 DIAGNOSIS — B9689 Other specified bacterial agents as the cause of diseases classified elsewhere: Secondary | ICD-10-CM

## 2021-02-25 MED ORDER — METRONIDAZOLE 500 MG PO TABS: 500.0000 mg | ORAL_TABLET | Freq: Two times a day (BID) | ORAL | 0 refills | Status: DC

## 2021-02-25 NOTE — Progress Notes (Signed)
  Postoperative Follow-up Patient presents post op from  Hysteroscopy with Dilation and Curettage  for  missed abortion , 1 month ago.  Subjective: She reports that since the surgery she has been doing well. Eating a regular diet without difficulty.  The patient is not having any pain.   Activity: normal activities of daily living.   Objective: BP 112/70   Ht 5\' 7"  (1.702 m)   Wt 197 lb 6.4 oz (89.5 kg)   LMP 02/25/2021 Comment: Dx: missed abortion  Breastfeeding Unknown   BMI 30.92 kg/m  Physical Exam Constitutional:      Appearance: Normal appearance. She is well-developed.  HENT:     Head: Normocephalic and atraumatic.  Eyes:     Extraocular Movements: Extraocular movements intact.     Pupils: Pupils are equal, round, and reactive to light.  Neck:     Thyroid: No thyromegaly.  Cardiovascular:     Rate and Rhythm: Normal rate and regular rhythm.     Heart sounds: Normal heart sounds.  Pulmonary:     Effort: Pulmonary effort is normal.     Breath sounds: Normal breath sounds.  Abdominal:     General: Bowel sounds are normal. There is no distension.     Palpations: Abdomen is soft. There is no mass.  Musculoskeletal:     Cervical back: Neck supple.  Neurological:     Mental Status: She is alert and oriented to person, place, and time.  Skin:    General: Skin is warm and dry.  Psychiatric:        Behavior: Behavior normal.        Thought Content: Thought content normal.        Judgment: Judgment normal.  Vitals reviewed.    Assessment: s/p :  D&C  stable  Plan:  Patient has done well after surgery with no apparent complications.   I have discussed the post-operative course to date, and the expected progress moving forward.  The patient understands what complications to be concerned about.  I will see the patient in routine follow up, or sooner if needed.    Would like presumptive BV treatment with flagyl Grief resources given Released [employer] result Condoms  for Birth control Positive pregnancy test recently at home, has had unprotected intercourse since the surgery. Started having menstrual bleeding today.    02/27/2021 MD, [facility], [facility] 02/25/2021 4:04 PM

## 2021-02-25 NOTE — Patient Instructions (Signed)
[facility]  MJ Tucci [phone]   Please accept our heartfelt condolences and call us if you need an ear to listen.  Web Resources: www.[email or web address] www.acog.org [email or web address] This site has an excellent pamphlet that you can download on early pregnancy loss.  Books: Miscarriage: Women Sharing from the Heart by Marie Allen and Shelly Marks. Published by [employer]. Summarizes reactions of 100 women who experience miscarriage.  Molly's [place] by Janice Cohn. Published by [employer]. Children's book on miscarriage.  Miscarriage: A Man's Book by Rick Wheat. Published by [employer] at [phone]. Written to help men understand reactions of both parents to miscarriage.  Miscarriage: A Shattered Dream by Sherokee Ilse and Linda Hammer Burns.  I Never Held You: A book about miscarriage, healing and recovery by Ellen M. DuBois.   MISCARRIAGE OR NEONATAL LOSS.a word to parents By Gretchen Gross, MSW  For those who experience a miscarriage or fetal loss, the world has turned upside down. Regardless of whether the pregnancy was planned and wanted, or unplanned and the parents were ambivalent, there are feelings of sadness, grief and a loss of equilibrium which effects both parents. Though we allow for these feelings when other deaths occur, we are remiss in acknowledging and allowing parents to grieve their pregnancy loss or miscarriage. We wrongly equate the depth of the loss to the size of the casket.  With pregnancy loss or death before birth, there is little guidance or expectation of what is normal grief. Others, who minimize or misunderstand the loss, may expect that you "get back to normal" quickly. With pressures to overlook the impact of the loss, healthy grieving processes can get stunted, overlooked, or stopped all together. I hope that this handout will  allow you to acknowledge and express whatever loss you feel, to share with others your sorrow and will encourage you and others to take a moment to understand and experience the depth of your emotions at this time.  A baby represents so much. It may represent hope, immortality, fulfillment of dreams, or an outward sign of a loving relationship. At the same time, pregnancy may bring on anxiety, fear, and an increase in commitment and responsibility. Understandably, few pregnant parents experience only one or the other of these emotions. Despite the type of emotional response to the pregnancy, as the process continues, what is central to almost all pregnancies is that over time, the bond between parent and child grows. Each day brings an expansion of the world. What was once routine now becomes more important. Diet, lifestyle, connection to family, time commitments, relationships with friends, spouse, others.nothing is as it was. These are the normal changes that a pregnant woman and her partner experience. When a baby dies, nothing that made sense before makes any sense after. We are left emotionally raw. Often our bodies respond in ways that assure us that we are crazy, but we are not. We are grieving a life that we never fully knew, a relationship that was too short. A part of us has died Too.  Mothers and fathers become more attached to a pregnancy as each day, week and month go by. Mothers have physical contact with the pregnancy, daily reminders of the growth and change occurring. They connect by talking to their bellies, by wondering what life will be like this time next year, look for clothes, cribs, pediatricians and more. Fathers often become attached in different ways: considering buying a new car or safe car   seat, working more to earn a little more money before the baby arrives, or by reconsidering the family's financial status. However bonding occurs, it generally increases as  the baby grows. For family members, co-workers and friends, the pregnancy may be less real. They may only know of the pregnancy from a physical or emotional distance. Since many of us are so uncomfortable with death and we work hard to deny it. With a prenatal loss, the lack of contact with the unborn baby may encourage others to minimize the loss, or even suggest that it was something to feel thankful for at this time suggesting that the pain felt now would be significantly less than at any other time in relationship to this child.  That is why it feels so crazy to be in such pain when others might not understand. You might feel that you are still standing at an emotional graveside and the world wonders "when you'll be ready to go back to work" or "why you still cry. It's been four months already." Be assured that it is the rest of the world that is, in this case, acting crazy. When a living child or adult dies, we have understandings, behaviors, rituals that though painful, enable us to feel cared for, comforted, acknowledged in our loss, and encourage healing. Unfortunately, too little of this is the case when there is a miscarriage or neonatal loss.  There are many difficult dilemmas that you might face in the wake of the loss. Do you have a funeral? How do you tell people? Do you publish an obituary? What do you do with the remains, both physical and emotional? I encourage people to find a formalized way to acknowledge the loss of the pregnancy and to honor the relationship that did and does still exist and will for the rest of your life. It is your choice as to whether this happens in a funeral service, in a memorial service, in a letter to the child who you did not meet or in some manner that makes sense to you religiously, spiritually or emotionally. It is not silly to ask friends or family to participate nor is it improper to keep this intensely personal and private. Grief, however,  needs to be a public process. We must grieve among others and with our loved ones. We do not all need to feel the same amount of pain in the loss, but we need to act as a loving community to support those most hurt at this time. Healthy grieving involves a strengthening of the bonds with others who we love and who love us. Some people ask a close friend or relative to contact others and inform them of the loss to minimize the telling of the story. Others find relief in telling friends, family and colleagues about the loss, in that it reminds us that the pregnancy was real and that this pain is real. Where one connection is broken, another is reinforced and strengthened. Grieving in isolation can be detrimental. By inviting important others to a funeral, tree planting, or sunset service and by taking them up on an offer to help will help your healing.  Some people have described aspects of grief after a pregnancy loss that are quite different from those present after the loss of an adult relative or friend. Some things that might seem "weird" or "abnormal" are indeed, quite normal following this kind of loss. G.W. Davidson described her experience in the book "Understanding: Death of the Wished-for Child." "  I kept searching for something. I wasn't sure what it was. All of a sudden one day in the kitchen, I spontaneously got out my kitchen scales and started weighing fruits and vegetables. I realized I was trying to find something that had the identical weight that the baby did.I found myself weighing my rolling pin.it happened to be the identical length and weight that the baby was." This describes the process of building memories. With miscarriage and neonatal loss, there are too few memories of the pregnancy or the baby itself. Parents may not ever be able to see or hold a baby in these cases, especially in the earlier trimesters, so saving mementos and gathering and providing information  are especially important. G.W. Davidson described a parent who needs to see and feel something that had similar dimensions as the child. This is profoundly different than the grieving that happens when a 35 year old person dies, when we have years of memories, concrete possessions, photos and history. Though it is a different aspect of grief, it is normal, expected and healthy. Your grief will not make you crazy.  Also, do not be frightened if you feel this loss in a very physical manner. Your arms or chest may ache, convincing you that your heart has broken. Women have described this as a feeling of "empty arms". They also describe a similar feeling of an emptiness in their bellies, which was previously full. Some women also swear that they still feel the baby moving in their bellies. Many people report being woken to the sound of a crying baby, having vivid dreams about babies, trying to find their lost babies, or graphic dreams of injuries happening to themselves or others. Breasts may still feel as if they are filling and it may seem as if the body is unaware of the loss. These things can feel quite confusing. If you experience any of these responses, please tell someone about them. They are not a sign of insanity, they are aspects of grief. Keeping them to yourself increases the feelings of isolation and disequilibrium. Be reassured that these are normal experiences after the loss of a pregnancy.   A WORD ABOUT DIFFERENCES BETWEEN MEN'S AND WOMEN'S GRIEF  If most people do not fully understand the pain of a mother's grief through pregnancy loss, there is an even greater denial on society's part that the father is in pain. I have seen men in great pain and despair after the loss of a pregnancy and they say that nobody has ever asked them about their loss. People may only ask about their wife/partner's pain. To put it simply, one father, head bowed, shoulders heaving as he cried said  "if one more person asks me how Jane is, I'll fly apart. Don't they know I lost a child too? Don't they see the circles under my eyes, my face, my pain? Don't I count?" Couples often move through the grief process in different ways and at different times. Do not assume that your partner is better because they are wanting to take in a movie or go out to eat. This may just be a diversion, a need for a change of scenery, and a yearning for normalcy. Women are usually more comfortable crying and men find safety in action. For this reason, friction can develop between partners, when one wants the other to start behaving like they did before the loss or to cry along with them. Respect one another's process. Talk to your partner about   the specifics. If asked, "How are you today?" many people will just say "fine". Specifically relate that you have had a hard time with the holiday, or seeing a friend's new baby, and ask your partner how they felt in that moment. Sex is very often a very difficult prospect after a miscarriage or loss. It might remind a person of the conception, raise fears of another pregnancy and future children, or seem like too much pleasure to experience when one is otherwise in pain. Many men reconnect with their partner by being sexual and feel increasingly isolated when a partner does not respond. Talk. There may be a comfortable middle ground with each other. If the general patterns of the relationship shift toward uncomfortable dynamics, such as decreased communication, blaming one another, or increased absences from the home, it is important to seek counseling with a professional. This is not an easy time, you have had a loss. Be patient with yourself. Seek help and support from loved ones, friends, professionals. The grief will evolve with time and you will not always be in such immediate or raw pain. Be assured that you may never forget your pregnancy or your baby. Healing  takes time. I have compiled a list of books on grief and death that might be useful.  *Anna: A Daughter's Life. William Loizeaux, [employer], 1993. A father's account of his grief at the loss of his infant daughter. *Explaining Death to Children. Earl Grollman, [employer], 1967. A valuable guide to help adults/parents explain death to children. *When Pregnancy Fails: Families Coping with Miscarriage, Ectopic Pregnancy, Stillbirth, and Infant Death. Susan Borg and Judith Lasker, [employer], 1989. A good resource to understand pregnancy loss, including some information on support for the family. *Hour of Gold, Hour of Lead: Diaries and Letters of Anne Morrow Lindbergh. Harcourt Brace Jovanovich, 1973. Anne Lindbergh writes of the pain and loss in the wake of their young child's abduction. *A Child Dies: A Portrait of Family Grief. Joan Hagan Arnold and Penelope Bushman Gemma, the [employer], 1995. Dealing with death of unborn children, infants, and children of all ages. Revised by Nancy J. McClellan, CNM, MS November, 2008    COPING WITH A MISCARRIAGE Susan Donnis, R.N., M.A.T.  You have had a miscarriage. You have lost a pregnancy. This is undoubtedly a difficult and sad time for you. Your loss may be hard to believe and it is frustrating to know you are helpless to change anything. "This can't possibly be happening to me. Why me? Why not someone else" I promise I'll be more careful if I can only have my baby back." These are common reactions to experiencing a miscarriage. Feelings of disbelief and anger, helplessness and guilt, depression and perhaps failure, are real and understandable. You may tell yourself that the guilt you feel is irrational, unreasonable or inappropriate, but it is there. "What did I do to cause this? Why wasn't I more careful? If only I had known. Is this a punishment for something I did?" You may find yourself recounting the days or weeks  before your miscarriage, searching for clues that you feel sure you must have missed; searching for valid reasons for how or why this happened. Having something "taken away" that you cherish feels shocking and unbelievable. You ask your doctor or midwife for an explanation; friends volunteer their opinions. In many cases, your questions of "how" or "why" are not satisfactorily answered. You may have some of the above thoughts or feelings and they   may be present in different combinations, differing degrees, and in some confusion. They are understandable and part of the process of beginning to cope with a significant loss. With any death, it is healthy and essential to allow yourself to experience grief whether you are a man or woman. The grieving process is not something begun and completed in a day, a week or a month. It takes time, understanding, support and acceptance. In the case of a miscarriage, this process is often more difficult because the death is not publicly acknowledged. There is no funeral to arrange and attend, no outward recognition of a loss. This may tend to increase feelings of isolation and loneliness. The term miscarriage is used here instead of the more medically correct term spontaneous abortion to avoid confusion with elective abortion. Some couples feel they must keep their grief invisible and "get over this quickly." Most employers will freely give time off to attend a family funeral, but many do not understand the reasonable and justified request for time off after a miscarriage. Loss of a longed-for pregnancy, regardless of how early, is a significant bereavement. Men and women may react to the miscarriage differently. Men may feel more helpless and frustrated than their wives because they often assume a passive-observer role. Waiting and watching can be harder than actively participating, regardless of the outcome. Men do experience a miscarriage in spite of not  actively participating in the physical loss. People have different ways of coping with a loss and you need to choose what is most helpful to you. Too often "being strong" is looked upon as a healthy way of coping, when, in fact, repressing and ignoring very real feelings is not helpful and contributes to serious coping problems. Some feelings and questions that men and women express and ask are:  It hangs over me like a black cloud. Though I've tried, I honestly don't know how to work this through. How do I say goodbye?  I feel extremely guilty. I guess I really didn't want this pregnancy. I feel relieved that it's over, but so guilty because I'm relieved. This isn't right.  I'm fine. Life must go on and I'm a strong person. There's really nothing to grieve for anyway, is there?  I feel pressure not to be sad. So I tend to cover up a lot.  How do I respond to well meaning but frustrating platitudes like, "Try again soon, dear. Another pregnancy will help." I would like to be pregnant again, but I believe a pregnancy should happen at a time of strength, not sadness.  How do I respond to silence and awkward attempts to avoid the whole issue? (You may be the one that decides to bring up the topic. Friends, relatives and acquaintances often do not know how to respond, so they say nothing).  I'm fine until my period comes. It is such a start reminder of the part of me that is lost.  My husband/wife and I always enjoyed a good sex life. Since my miscarriage, I have a hard time allowing myself to feel loving and sensuous. After all if such a loving and pleasurable experience as intercourse started something that ended in such a tragedy, it's too scary to think it might happen again. Why doesn't my husband/wife understand?  I have to face reality. Should I just force myself to attend my friend's baby shower?  Everyone was so supportive and caring when I was in the hospital. That was four months  ago.   I can't burden others with my sadness now. Why am I still sad? I should be over this thing by now. Knowing common facts about miscarriage probably will not blot out your negative feelings - denial, anger, disappointment, sadness - but facts can hold some comfort and can help cushion the sadness and guilt. For those wanting a child, it is frustrating and intensely disappointing to experience any miscarriage. However, most pregnancies that end in the first three months are imperfect in some way and, regardless of any precaution or intervention, are incapable of surviving and growing beyond approximately 14 weeks. This fact may not ease your sense of emptiness and sadness, but it may help ease your guilt and sense of assumed responsibility. Knowing clearly that you did not cause this to happen, that you are not directly responsible of your miscarriage by what you did or did not do, can bring a sense of relief and relaxation to your anxieties or guilt. A pregnancy that ends in later months is uniquely difficult also. Your protruding abdomen, your child's movements and heartbeat, are concrete proof of your expectant parenthood. A miscarriage occurring at 6 weeks or at 6 months causes a sudden change of events and feelings. Understandably, you may feel robbed or cheated of a promise of what was to be. It's easy to think that everyone else can have children. Surely I've been tricked or badly treated, you think. A fact not commonly know is that at least one in five pregnancies end in miscarriage, which indicates you are not alone. In a room of 25 couples, the odds are that 4 have experienced a miscarriage. Of these 8 people, many have shared your experience and feelings. Of course, this is not an easy topic to talk about, therefore it can appear that miscarriages rarely happen and that you are the only one coping with this loss. Realizing you are note alone can perhaps ease some of the shock and  disbelief of "WHY Me?"  Three actions will help: 1. Giver yourself permission to be sad. It is possible to be sad without becoming chronically depressed. Set limits for yourself if you are concerned about becoming overly depressed. If the sadness begins to wash over you while at work at 2 p.m., tell yourself that you cannot deal with it then, but make an agreement with yourself that at 8 p.m. that night you will. And then do it. Many individuals report a beginning sense of control over their grief when they realize they can create appropriate and private times to be sad, alone, or with a significant other person. If you think you may be severely depressed (i.e. having difficulty getting out of bed in the morning, withdrawal from friends and relatives, insomnia, extended loss of appetite or overeating, loss of the ability to enjoy anything), this problem is probably not something you can handle yourself. It is important to seek out professional help at this time, or at any time when you are feeling particularly discouraged about your Coping.  2. Find a supportive, trusted person and talk about your feelings and your questions. Build a support system, whether it be your spouse, partner, friend, relative, colleague, clergyman, support group or professional counselor. You may still feel some pangs of sadness when attending a baby shower, celebrating a holiday, seeing other pregnant couples or passing the date of your expected delivery, but once you work through your loss, coping with your situation will become easier.  3. Also part of the process of life   itself, is understanding and accepting incongruent or seemingly contradictory feelings. As one insightful women expressed, "Being happy for a friend who has just had a baby (or twins!) is a genuine feeling, but also resentment, anger and frustration are there, all wrapped into one package;" two apparently conflicting feelings, but both can  exist, be recognized, and accepted.  A miscarriage is an unexpected, often unexplained, death; with any loss you need to give yourself permission to grieve, to be angry, sad or relieved. There is no one appropriate reaction. Acknowledging your feelings that are real and understandable, though they may not be logical or rational, is part of the grieving process. Allowing the grieving process to happen is a positive way of coping and leads to health resolve and strength to look ahead. There may be times when you may not be able to put words to your feelings, but that does not need to stop you from seeking out and talking with an understanding person. Building your own support system will help you regain confidence and strength within yourself.  RECOMMENDED READING The following materials are all available through [employer], [address], [place] [address]; phone [phone]. For Adults: Empty Arms by Sherokee Ilse Empty Cradle, Broken Heart by Deborah Davis Ended Beginnings by C. Panuthos & C. Romeo Miscarriage a [employer] Resource Miscarriage - A Shattered Dream by Sherokee Ilse Newborn Death a [employer] Resource Still To Be Born by Perinatal Loss When a Baby Dies by M.J. Church et al (TCF) When Hello Means Goodbye by P. Schwiebert and P. Kirk When Pregnancy Fails by S. Borg & J. Lasker For Helping Other Children How Do We Tell the Children? by Shaefer & Lyons No New Baby by Marilyn Gryte Talking About Death by Earl Grollman Where's Jess?? A [employer] Resource Other helpful materials available locally at bookstores: The Fall of Freddy the Leaf by Leo Buscaglia, PhD, [employer] (for children) When Goodbye is Forever - Learning to Live Again After the Loss of a Child by John Bramblett, [employer]   

## 2021-02-26 LAB — BETA HCG QUANT (REF LAB): hCG Quant: 20 m[IU]/mL

## 2021-02-27 ENCOUNTER — Other Ambulatory Visit: Payer: Self-pay

## 2021-02-27 ENCOUNTER — Other Ambulatory Visit: Payer: Medicaid Other

## 2021-02-27 DIAGNOSIS — O3680X0 Pregnancy with inconclusive fetal viability, not applicable or unspecified: Secondary | ICD-10-CM

## 2021-02-28 LAB — BETA HCG QUANT (REF LAB): hCG Quant: 18 m[IU]/mL

## 2021-03-04 ENCOUNTER — Ambulatory Visit (INDEPENDENT_AMBULATORY_CARE_PROVIDER_SITE_OTHER): Payer: Medicaid Other | Admitting: Obstetrics and Gynecology

## 2021-03-04 ENCOUNTER — Other Ambulatory Visit: Payer: Self-pay

## 2021-03-04 VITALS — BP 114/72 | Ht 67.0 in | Wt 202.4 lb

## 2021-03-04 DIAGNOSIS — O3680X0 Pregnancy with inconclusive fetal viability, not applicable or unspecified: Secondary | ICD-10-CM

## 2021-03-04 NOTE — Progress Notes (Signed)
Patient ID: Haley Kramer, female   DOB: Aug 20, 1985, 35 y.o.   MRN: [id number]  Reason for Consult: Gynecologic Exam   Referred by [place], Julie A, MD  Subjective:     HPI:  Haley Kramer is a 35 y.o. female she is following up after her dilation and curettage for a missed abortion.  She reports that last week she had a normal menstrual cycle occurring from Monday to Saturday.  She had 2 beta hCGs last week which were decreasing.  .  Patient has not been having any symptoms of irregular bleeding.   She reports that she would like to try and conceive again.  She does not desire contraception at this time.  Past Medical History:  Diagnosis Date   Incomplete abortion 01/14/2021   Obesity    PONV (postoperative nausea and vomiting)    Family History  Problem Relation Age of Onset   Hypertension Mother    Diabetes Mother    Hypertension Father    Diabetes Father    Hypertension Maternal Grandmother    Diabetes Maternal Grandmother    Hypertension Maternal Grandfather    Diabetes Maternal Grandfather    Past Surgical History:  Procedure Laterality Date   CESAREAN SECTION  03/23/2011   DILATION AND EVACUATION N/A 01/22/2021   Procedure: DILATATION AND EVACUATION;  Surgeon: Natale Milch, MD;  Location: [facility] ORS;  Service: Gynecology;  Laterality: N/A;   ESOPHAGOGASTRODUODENOSCOPY  2016   GASTRIC BYPASS  2016    Short Social History:  Social History   Tobacco Use   Smoking status: Never   Smokeless tobacco: Never  Substance Use Topics   Alcohol use: No    No Known Allergies  Current Outpatient Medications  Medication Sig Dispense Refill   acetaminophen (TYLENOL) 500 MG tablet Take 500 mg by mouth every 8 (eight) hours as needed for moderate pain.     diphenhydrAMINE HCl, Sleep, (SLEEP AID) 50 MG CAPS Take 50 mg by mouth at bedtime as needed (sleep).     folic acid (FOLVITE) 800 MCG tablet Take 400 mcg by mouth daily.     ibuprofen (ADVIL) 600 MG tablet  Take 1 tablet (600 mg total) by mouth every 6 (six) hours as needed. 60 tablet 0   IRON-B12-VIT C-FA-IFC PO Take 1 tablet by mouth daily.     Melatonin 10 MG TABS Take 10 mg by mouth daily.     metroNIDAZOLE (FLAGYL) 500 MG tablet Take 1 tablet (500 mg total) by mouth 2 (two) times daily. 14 tablet 0   ondansetron (ZOFRAN ODT) 4 MG disintegrating tablet Take 1 tablet (4 mg total) by mouth every 6 (six) hours as needed for nausea. 30 tablet 0   Prenatal Vit-Fe Fumarate-FA (MULTIVITAMIN-PRENATAL) 27-0.8 MG TABS tablet Take 1 tablet by mouth daily at 12 noon. 100 tablet 0   traMADol (ULTRAM) 50 MG tablet Take 1 tablet (50 mg total) by mouth every 6 (six) hours as needed. 10 tablet 0   No current facility-administered medications for this visit.    Review of Systems  Constitutional: Negative for chills, fatigue, fever and unexpected weight change.  HENT: Negative for trouble swallowing.  Eyes: Negative for loss of vision.  Respiratory: Negative for cough, shortness of breath and wheezing.  Cardiovascular: Negative for chest pain, leg swelling, palpitations and syncope.  GI: Negative for abdominal pain, blood in stool, diarrhea, nausea and vomiting.  GU: Negative for difficulty urinating, dysuria, frequency and hematuria.  Musculoskeletal: Negative for back  pain, leg pain and joint pain.  Skin: Negative for rash.  Neurological: Negative for dizziness, headaches, light-headedness, numbness and seizures.  Psychiatric: Negative for behavioral problem, confusion, depressed mood and sleep disturbance.       Objective:  Objective   Vitals:   03/04/21 1637  BP: 114/72  Weight: 202 lb 6.4 oz (91.8 kg)  Height: 5\' 7"  (1.702 m)   Body mass index is 31.7 kg/m.  Physical Exam Vitals and nursing note reviewed. Exam conducted with a chaperone present.  Constitutional:      Appearance: Normal appearance.  HENT:     Head: Normocephalic and atraumatic.  Eyes:     Extraocular Movements:  Extraocular movements intact.     Pupils: Pupils are equal, round, and reactive to light.  Cardiovascular:     Rate and Rhythm: Normal rate and regular rhythm.  Pulmonary:     Effort: Pulmonary effort is normal.     Breath sounds: Normal breath sounds.  Abdominal:     General: Abdomen is flat.     Palpations: Abdomen is soft.  Musculoskeletal:     Cervical back: Normal range of motion.  Skin:    General: Skin is warm and dry.  Neurological:     General: No focal deficit present.     Mental Status: She is alert and oriented to person, place, and time.  Psychiatric:        Behavior: Behavior normal.        Thought Content: Thought content normal.        Judgment: Judgment normal.    Assessment/Plan:     35 year old status post dilation evacuation of missed abortion  She had 2 beta hCGs last week which were decreasing.  This is likely consistent with the resolution of her pregnancy.  We will plan to repeat the beta-hCG in 1 to 2 weeks.  At that time we will hopefully be less than 5 consistent with no longer being pregnant.  Patient has not been having any symptoms of irregular bleeding.  We reviewed that if she were to start having irregular bleeding this would be suspicious for retained products and she would need a pelvic ultrasound.  She will notify me if that is the case.  More than 15 minutes were spent face to face with the patient in the room, reviewing the medical record, labs and images, and coordinating care for the patient. The plan of management was discussed in detail and counseling was provided.   31 MD, [facility], [facility] 03/04/2021 5:22 PM

## 2021-03-27 ENCOUNTER — Other Ambulatory Visit: Payer: Self-pay

## 2021-03-27 ENCOUNTER — Other Ambulatory Visit: Payer: Medicaid Other

## 2021-03-27 DIAGNOSIS — O3680X0 Pregnancy with inconclusive fetal viability, not applicable or unspecified: Secondary | ICD-10-CM

## 2021-03-28 LAB — BETA HCG QUANT (REF LAB): hCG Quant: 32 m[IU]/mL

## 2021-03-29 ENCOUNTER — Telehealth: Payer: Self-pay

## 2021-03-29 ENCOUNTER — Other Ambulatory Visit: Payer: Self-pay | Admitting: Obstetrics & Gynecology

## 2021-03-29 MED ORDER — DOXYLAMINE-PYRIDOXINE 10-10 MG PO TBEC: 2.0000 | DELAYED_RELEASE_TABLET | Freq: Every day | ORAL | 5 refills | Status: DC

## 2021-03-29 NOTE — Telephone Encounter (Signed)
Pt calling for a different type of nausea medication.  [phone]  Pt has tried vitamin B6 and unisom; also zofran; neither is helping.  States with her last preg 78yrs ago a patch helped pretty good.  Pharm correct in chart.  Adv msg would be sent to [facility] on call.

## 2021-03-29 NOTE — Telephone Encounter (Signed)
Diclegis called in

## 2021-03-29 NOTE — Telephone Encounter (Signed)
Pt aware.

## 2021-04-01 ENCOUNTER — Other Ambulatory Visit: Payer: Self-pay | Admitting: Obstetrics and Gynecology

## 2021-04-01 ENCOUNTER — Encounter: Payer: Self-pay | Admitting: Obstetrics and Gynecology

## 2021-04-01 DIAGNOSIS — O3680X0 Pregnancy with inconclusive fetal viability, not applicable or unspecified: Secondary | ICD-10-CM

## 2021-04-02 ENCOUNTER — Other Ambulatory Visit: Payer: Self-pay

## 2021-04-02 ENCOUNTER — Other Ambulatory Visit: Payer: Medicaid Other

## 2021-04-02 DIAGNOSIS — O3680X0 Pregnancy with inconclusive fetal viability, not applicable or unspecified: Secondary | ICD-10-CM

## 2021-04-02 NOTE — Telephone Encounter (Signed)
Contacted patient via phone. Patient is scheduled for 2:40 today for labs

## 2021-04-03 LAB — BETA HCG QUANT (REF LAB): hCG Quant: 1202 m[IU]/mL

## 2021-04-19 ENCOUNTER — Ambulatory Visit (INDEPENDENT_AMBULATORY_CARE_PROVIDER_SITE_OTHER): Payer: Medicaid Other | Admitting: Obstetrics and Gynecology

## 2021-04-19 ENCOUNTER — Encounter: Payer: Self-pay | Admitting: Obstetrics and Gynecology

## 2021-04-19 ENCOUNTER — Other Ambulatory Visit: Payer: Self-pay

## 2021-04-19 VITALS — BP 120/72 | Ht 68.0 in | Wt 197.6 lb

## 2021-04-19 DIAGNOSIS — N912 Amenorrhea, unspecified: Secondary | ICD-10-CM | POA: Diagnosis not present

## 2021-04-19 DIAGNOSIS — R875 Abnormal microbiological findings in specimens from female genital organs: Secondary | ICD-10-CM | POA: Diagnosis not present

## 2021-04-19 DIAGNOSIS — N771 Vaginitis, vulvitis and vulvovaginitis in diseases classified elsewhere: Secondary | ICD-10-CM | POA: Diagnosis not present

## 2021-04-19 DIAGNOSIS — Z3490 Encounter for supervision of normal pregnancy, unspecified, unspecified trimester: Secondary | ICD-10-CM

## 2021-04-19 NOTE — Patient Instructions (Signed)
First Trimester of Pregnancy °The first trimester of pregnancy starts on the first day of your last menstrual period until the end of week 12. This is months 1 through 3 of pregnancy. A week after a sperm fertilizes an egg, the egg will implant into the wall of the uterus and begin to develop into a baby. By the end of 12 weeks, all the baby's organs will be formed and the baby will be 2-3 inches in size. °Body changes during your first trimester °Your body goes through many changes during pregnancy. The changes vary and generally return to normal after your baby is born. °Physical changes °You may gain or lose weight. °Your breasts may begin to grow larger and become tender. The tissue that surrounds your nipples (areola) may become darker. °Dark spots or blotches (chloasma or mask of pregnancy) may develop on your face. °You may have changes in your hair. These can include thickening or thinning of your hair or changes in texture. °Health changes °You may feel nauseous, and you may vomit. °You may have heartburn. °You may develop headaches. °You may develop constipation. °Your gums may bleed and may be sensitive to brushing and flossing. °Other changes °You may tire easily. °You may urinate more often. °Your menstrual periods will stop. °You may have a loss of appetite. °You may develop cravings for certain kinds of food. °You may have changes in your emotions from day to day. °You may have more vivid and strange dreams. °Follow these instructions at home: °Medicines °Follow your health care provider's instructions regarding medicine use. Specific medicines may be either safe or unsafe to take during pregnancy. Do not take any medicines unless told to by your health care provider. °Take a prenatal vitamin that contains at least 600 micrograms (mcg) of folic acid. °Eating and drinking °Eat a healthy diet that includes fresh fruits and vegetables, whole grains, good sources of protein such as meat, eggs, or tofu,  and low-fat dairy products. °Avoid raw meat and unpasteurized juice, milk, and cheese. These carry germs that can harm you and your baby. °If you feel nauseous or you vomit: °Eat 4 or 5 small meals a day instead of 3 large meals. °Try eating a few soda crackers. °Drink liquids between meals instead of during meals. °You may need to take these actions to prevent or treat constipation: °Drink enough fluid to keep your urine pale yellow. °Eat foods that are high in fiber, such as beans, whole grains, and fresh fruits and vegetables. °Limit foods that are high in fat and processed sugars, such as fried or sweet foods. °Activity °Exercise only as directed by your health care provider. Most people can continue their usual exercise routine during pregnancy. Try to exercise for 30 minutes at least 5 days a week. °Stop exercising if you develop pain or cramping in the lower abdomen or lower back. °Avoid exercising if it is very hot or humid or if you are at high altitude. °Avoid heavy lifting. °If you choose to, you may have sex unless your health care provider tells you not to. °Relieving pain and discomfort °Wear a good support bra to relieve breast tenderness. °Rest with your legs elevated if you have leg cramps or low back pain. °If you develop bulging veins (varicose veins) in your legs: °Wear support hose as told by your health care provider. °Elevate your feet for 15 minutes, 3-4 times a day. °Limit salt in your diet. °Safety °Wear your seat belt at all times when driving   or riding in a car. °Talk with your health care provider if someone is verbally or physically abusive to you. °Talk with your health care provider if you are feeling sad or have thoughts of hurting yourself. °Lifestyle °Do not use hot tubs, steam rooms, or saunas. °Do not douche. Do not use tampons or scented sanitary pads. °Do not use herbal remedies, alcohol, illegal drugs, or medicines that are not approved by your health care provider. Chemicals  in these products can harm your baby. °Do not use any products that contain nicotine or tobacco, such as cigarettes, e-cigarettes, and chewing tobacco. If you need help quitting, ask your health care provider. °Avoid cat litter boxes and soil used by cats. These carry germs that can cause birth defects in the baby and possibly loss of the unborn baby (fetus) by miscarriage or stillbirth. °General instructions °During routine prenatal visits in the first trimester, your health care provider will do a physical exam, perform necessary tests, and ask you how things are going. Keep all follow-up visits. This is important. °Ask for help if you have counseling or nutritional needs during pregnancy. Your health care provider can offer advice or refer you to specialists for help with various needs. °Schedule a dentist appointment. At home, brush your teeth with a soft toothbrush. Floss gently. °Write down your questions. Take them to your prenatal visits. °Where to find more information °American Pregnancy Association: americanpregnancy.org °American College of Obstetricians and Gynecologists: acog.org/en/Womens%20Health/Pregnancy °Office on Women's Health: womenshealth.gov/pregnancy °Contact a health care provider if you have: °Dizziness. °A fever. °Mild pelvic cramps, pelvic pressure, or nagging pain in the abdominal area. °Nausea, vomiting, or diarrhea that lasts for 24 hours or longer. °A bad-smelling vaginal discharge. °Pain when you urinate. °Known exposure to a contagious illness, such as chickenpox, measles, Zika virus, HIV, or hepatitis. °Get help right away if you have: °Spotting or bleeding from your vagina. °Severe abdominal cramping or pain. °Shortness of breath or chest pain. °Any kind of trauma, such as from a fall or a car crash. °New or increased pain, swelling, or redness in an arm or leg. °Summary °The first trimester of pregnancy starts on the first day of your last menstrual period until the end of week  12 (months 1 through 3). °Eating 4 or 5 small meals a day rather than 3 large meals may help to relieve nausea and vomiting. °Do not use any products that contain nicotine or tobacco, such as cigarettes, e-cigarettes, and chewing tobacco. If you need help quitting, ask your health care provider. °Keep all follow-up visits. This is important. °This information is not intended to replace advice given to you by your health care provider. Make sure you discuss any questions you have with your health care provider. °Document Revised: 10/05/2019 Document Reviewed: 08/11/2019 °Elsevier Patient Education © 2022 [employer] Inc. ° °

## 2021-04-19 NOTE — Progress Notes (Signed)
Patient ID: Haley Kramer, female   DOB: July 11, 1985, 35 y.o.   MRN: [id number]  Reason for Consult: Follow-up   Referred by [place], Julie A, MD  Subjective:     HPI:  Haley Kramer is a 35 y.o. female. She presents today after beta-hCG showed a pregnancy following a D&E for a missed abortion.  She reports that she has been feeling well.  She has had some nausea.  She reports small amounts of vaginal bleeding.  She reports that she has been nervous given her history of prior pregnancy losses.  She reports vaginal discharge.  Gynecological History  Patient's last menstrual period was 02/25/2021. Past Medical History:  Diagnosis Date   Incomplete abortion 01/14/2021   Obesity    PONV (postoperative nausea and vomiting)    Family History  Problem Relation Age of Onset   Hypertension Mother    Diabetes Mother    Hypertension Father    Diabetes Father    Hypertension Maternal Grandmother    Diabetes Maternal Grandmother    Hypertension Maternal Grandfather    Diabetes Maternal Grandfather    Past Surgical History:  Procedure Laterality Date   CESAREAN SECTION  03/23/2011   DILATION AND EVACUATION N/A 01/22/2021   Procedure: DILATATION AND EVACUATION;  Surgeon: Natale Milch, MD;  Location: [facility] ORS;  Service: Gynecology;  Laterality: N/A;   ESOPHAGOGASTRODUODENOSCOPY  2016   GASTRIC BYPASS  2016    Short Social History:  Social History   Tobacco Use   Smoking status: Never   Smokeless tobacco: Never  Substance Use Topics   Alcohol use: No    No Known Allergies  Current Outpatient Medications  Medication Sig Dispense Refill   acetaminophen (TYLENOL) 500 MG tablet Take 500 mg by mouth every 8 (eight) hours as needed for moderate pain.     Doxylamine-Pyridoxine (DICLEGIS) 10-10 MG TBEC Take 2 tablets by mouth at bedtime. If symptoms persist, add one tablet in the morning and one in the afternoon 100 tablet 5   folic acid (FOLVITE) 800 MCG tablet Take 400  mcg by mouth daily.     IRON-B12-VIT C-FA-IFC PO Take 1 tablet by mouth daily.     metroNIDAZOLE (FLAGYL) 500 MG tablet Take 1 tablet (500 mg total) by mouth 2 (two) times daily. 14 tablet 0   ondansetron (ZOFRAN ODT) 4 MG disintegrating tablet Take 1 tablet (4 mg total) by mouth every 6 (six) hours as needed for nausea. 30 tablet 0   traMADol (ULTRAM) 50 MG tablet Take 1 tablet (50 mg total) by mouth every 6 (six) hours as needed. 10 tablet 0   diphenhydrAMINE HCl, Sleep, (SLEEP AID) 50 MG CAPS Take 50 mg by mouth at bedtime as needed (sleep).     ibuprofen (ADVIL) 600 MG tablet Take 1 tablet (600 mg total) by mouth every 6 (six) hours as needed. 60 tablet 0   Melatonin 10 MG TABS Take 10 mg by mouth daily.     No current facility-administered medications for this visit.    Review of Systems  Constitutional: Negative for chills, fatigue, fever and unexpected weight change.  HENT: Negative for trouble swallowing.  Eyes: Negative for loss of vision.  Respiratory: Negative for cough, shortness of breath and wheezing.  Cardiovascular: Negative for chest pain, leg swelling, palpitations and syncope.  GI: Negative for abdominal pain, blood in stool, diarrhea, nausea and vomiting.  GU: Negative for difficulty urinating, dysuria, frequency and hematuria.  Musculoskeletal: Negative for back pain,  leg pain and joint pain.  Skin: Negative for rash.  Neurological: Negative for dizziness, headaches, light-headedness, numbness and seizures.  Psychiatric: Negative for behavioral problem, confusion, depressed mood and sleep disturbance.       Objective:  Objective   Vitals:   04/19/21 1355  BP: 120/72  Weight: 197 lb 9.6 oz (89.6 kg)  Height: 5\' 8"  (1.727 m)   Body mass index is 30.04 kg/m.  Physical Exam Vitals and nursing note reviewed. Exam conducted with a chaperone present.  Constitutional:      Appearance: Normal appearance. She is well-developed.  HENT:     Head: Normocephalic and  atraumatic.  Eyes:     Extraocular Movements: Extraocular movements intact.     Pupils: Pupils are equal, round, and reactive to light.  Cardiovascular:     Rate and Rhythm: Normal rate and regular rhythm.  Pulmonary:     Effort: Pulmonary effort is normal. No respiratory distress.     Breath sounds: Normal breath sounds.  Abdominal:     General: Abdomen is flat.     Palpations: Abdomen is soft.  Genitourinary:    Comments: External: Normal appearing vulva. No lesions noted.   Musculoskeletal:        General: No signs of injury.  Skin:    General: Skin is warm and dry.  Neurological:     Mental Status: She is alert and oriented to person, place, and time.  Psychiatric:        Behavior: Behavior normal.        Thought Content: Thought content normal.        Judgment: Judgment normal.    Assessment/Plan:     35 yo with early stage pregnancy IUP seen. CRL consistent with 7 weeks 0 days today.  FHR 140 bpm.   Nuswab today for vaginal discharge  More than 20 minutes were spent face to face with the patient in the room, reviewing the medical record, labs and images, and coordinating care for the patient. The plan of management was discussed in detail and counseling was provided.        Adrian Prows MD [facility], [facility] 04/19/2021 2:15 PM

## 2021-04-23 ENCOUNTER — Encounter: Payer: Self-pay | Admitting: Licensed Practical Nurse

## 2021-04-23 ENCOUNTER — Ambulatory Visit (INDEPENDENT_AMBULATORY_CARE_PROVIDER_SITE_OTHER): Payer: Medicaid Other | Admitting: Licensed Practical Nurse

## 2021-04-23 ENCOUNTER — Other Ambulatory Visit: Payer: Self-pay

## 2021-04-23 VITALS — BP 124/76 | HR 75 | Wt 195.0 lb

## 2021-04-23 DIAGNOSIS — Z23 Encounter for immunization: Secondary | ICD-10-CM

## 2021-04-23 DIAGNOSIS — F339 Major depressive disorder, recurrent, unspecified: Secondary | ICD-10-CM | POA: Insufficient documentation

## 2021-04-23 DIAGNOSIS — O0990 Supervision of high risk pregnancy, unspecified, unspecified trimester: Secondary | ICD-10-CM | POA: Diagnosis not present

## 2021-04-23 DIAGNOSIS — Z113 Encounter for screening for infections with a predominantly sexual mode of transmission: Secondary | ICD-10-CM

## 2021-04-23 DIAGNOSIS — N926 Irregular menstruation, unspecified: Secondary | ICD-10-CM | POA: Diagnosis not present

## 2021-04-23 DIAGNOSIS — F32A Depression, unspecified: Secondary | ICD-10-CM | POA: Insufficient documentation

## 2021-04-23 DIAGNOSIS — F3289 Other specified depressive episodes: Secondary | ICD-10-CM

## 2021-04-23 DIAGNOSIS — Z3A08 8 weeks gestation of pregnancy: Secondary | ICD-10-CM

## 2021-04-23 LAB — NUSWAB VAGINITIS PLUS (VG+)
Atopobium vaginae: HIGH Score — AB
BVAB 2: HIGH Score — AB
Candida albicans, NAA: NEGATIVE
Candida glabrata, NAA: NEGATIVE
Chlamydia trachomatis, NAA: NEGATIVE
Megasphaera 1: HIGH Score — AB
Neisseria gonorrhoeae, NAA: NEGATIVE
Trich vag by NAA: NEGATIVE

## 2021-04-23 LAB — POCT URINE PREGNANCY: Preg Test, Ur: POSITIVE — AB

## 2021-04-23 NOTE — Progress Notes (Signed)
NOB - no concerns. RM 5 °

## 2021-04-23 NOTE — Progress Notes (Signed)
New Obstetric Patient H&P    Chief Complaint: "Desires prenatal care"   History of Present Illness: Patient is a 35 y.o. G3P1001 Not Hispanic or Latino female, presents with amenorrhea and positive home pregnancy test. Patient's last menstrual period was 02/25/2021. and based on her  LMP, her EDD is Estimated Date of Delivery: 12/02/21 and her EGA is [redacted]w[redacted]d. Cycles are every   29-30. days, regular lasting 5-7 days, Her last pap smear was  10/29/2020   and was no abnormalities.    She had a urine pregnancy test which was positive 1 month(s)  ago. Her last menstrual period was normal and lasted for  about 5  day(s). Since her LMP she claims she has experienced breast tenderness, and nausea. She denies vaginal bleeding. Her past medical history is contibutory. Obesity, had gastric bypass in 2016, Depression, anemia. Her prior pregnancies are notable for  elevated blood pressures and need for LTCS  Depression: Reports since the beginning of the pandemic she has been struggling. It started when she lost her job. Had tried Zoloft a long time ago, felt it did not do anything.  Desires help-but more for sleep than anything, resistant to SSRI.   HX of LTCS at [facility]  in 2012 due to fetal bradycardia, desires care with [facility] this time because she desires more privacy in regards to her labor/birth, many of her family members work and [facility] and feels they would be disruptive in her care. Desires VBAC.   Since her LMP, she admits to the use of tobacco products  no She claims she has lost 10 pounds pounds since the start of her pregnancy.  There are cats in the home in the home  no  She admits close contact with children on a regular basis  yes  She has had chicken pox in the past yes She has had Tuberculosis exposures, symptoms, or previously tested positive for TB   no Current or past history of domestic violence. 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 Patient's age >/= 13 at [facility]  yes 2. Thalassemia ([place], [place], Mediterranean, or Asian background): MCV<80  no 3. Neural tube defect (meningomyelocele, spina bifida, anencephaly)  no 4. Congenital heart defect  no  5. Down syndrome  no 6. Tay-Sachs (Jewish, [place]  no 7. Canavan's Disease  no 8. Sickle cell disease or trait (African)  no  9. Hemophilia or other blood disorders  no  10. Muscular dystrophy  no  11. Cystic fibrosis  no  12. Huntington's Chorea  no  13. Mental retardation/autism  Yes, Nephew with autism  14. Other inherited genetic or chromosomal disorder  no 15. Maternal metabolic disorder (DM, PKU, etc)  no 16. Patient or FOB with a child with a birth defect not listed above no  16a. Patient or FOB with a birth defect themselves no 17. Recurrent pregnancy loss, or stillbirth  no  18. Any medications since LMP other than prenatal vitamins (include vitamins, supplements, OTC meds, drugs, alcohol)  no 19. Any other genetic/environmental exposure to discuss  no  Infection History:   1. Lives with someone with TB or TB exposed  no  2. Patient or partner has history of genital herpes  no 3. Rash or viral illness since LMP  Yes, Hives after eating flounder  4. History of STI (GC, CT, HPV,  syphilis, HIV)  no 5. History of recent travel :  no  Other pertinent information:  no     Review of Systems:10 point review of systems negative unless otherwise noted in HPI  Past Medical History:  Patient Active Problem List   Diagnosis Date Noted   Supervision of high risk pregnancy, antepartum 04/23/2021    Obesity HX LTCS  AMA   Nursing Staff Provider  Office Location  [facility] Dating    Language  English  Anatomy US    Flu Vaccine  04/23/21 Genetic Screen  NIPS:   TDaP vaccine   Hgb A1C or  GTT Early : Third trimester :   Covid    LAB RESULTS    Rhogam   Blood Type --/--/O NEG (09/12 1331)   Feeding Plan  Antibody POS (09/12 1331)  Contraception  Rubella    Circumcision  RPR     Pediatrician   HBsAg     Support Person  HIV    Prenatal Classes Aaron  Varicella     GBS  (For PCN allergy, check sensitivities)   BTL Consent     VBAC Consent  Pap      Hgb Electro    Pelvis Tested  CF      SMA            Depression 04/23/2021    PHQ- 9: 12 04/23/21 GAD-7: 14 04/23/21 No meds or therapy     Missed abortion    Benign hypertension 06/13/2020    Past Surgical History:  Past Surgical History:  Procedure Laterality Date   CESAREAN SECTION  03/23/2011   DILATION AND EVACUATION N/A 01/22/2021   Procedure: DILATATION AND EVACUATION;  Surgeon: Natale Milch, MD;  Location: [facility] ORS;  Service: Gynecology;  Laterality: N/A;   ESOPHAGOGASTRODUODENOSCOPY  2016   GASTRIC BYPASS  2016    Gynecologic History: Patient's last menstrual period was 02/25/2021.  Obstetric History: G65P1011,   Family History:  Family History  Problem Relation Age of Onset   Hypertension Mother    Diabetes Mother    Hypertension Father    Diabetes Father    Hypertension Maternal Grandmother    Diabetes Maternal Grandmother    Hypertension Maternal Grandfather    Diabetes Maternal Grandfather     Social History:  Social History   Socioeconomic History   Marital status: Single    Spouse name: Not on file   Number of children: 1   Years of education: Not on file   Highest education level: Not on file  Occupational History   Not on file  Tobacco Use   Smoking status: Never   Smokeless tobacco: Never  Vaping Use   Vaping Use: Never used  Substance and Sexual Activity   Alcohol use: No   Drug use: Never   Sexual activity: Yes    Partners: Male    Birth control/protection: None    Comment: hx mirena x 10 yr/removed 05/11/2020  Other Topics Concern   Not on file  Social History Narrative   Lives with mother and child    Social Determinants of Health   Financial Resource Strain: Not on file  Food Insecurity: Not on file  Transportation Needs: Not on file  Physical Activity: Not on file  Stress: Not on file  Social Connections: Not on file  Intimate Partner Violence: Not At Risk   Fear of Current or Ex-Partner: No   Emotionally Abused: No   Physically Abused: No   Sexually  Abused: No    Allergies:  flounder and pineapple   Medications: Prior to Admission medications   Medication Sig Start Date End Date Taking? Authorizing Provider  Doxylamine-Pyridoxine (DICLEGIS) 10-10 MG TBEC Take 2 tablets by mouth at bedtime. If symptoms persist, add one tablet in the morning and one in the afternoon 03/29/21  Yes Nadara Mustard, MD  folic acid (FOLVITE) 800 MCG tablet Take 400 mcg by mouth daily.   Yes [provider]  IRON-B12-VIT C-FA-IFC PO Take 1 tablet by mouth daily.   Yes [provider]  acetaminophen (TYLENOL) 500 MG tablet Take 500 mg by mouth every 8 (eight) hours as needed for moderate pain.    [provider]  metroNIDAZOLE (FLAGYL) 500 MG tablet Take 1 tablet (500 mg total) by mouth 2 (two) times daily. Patient not taking: Reported on 04/23/2021 02/25/21   Natale Milch, MD  ondansetron (ZOFRAN ODT) 4 MG disintegrating tablet Take 1 tablet (4 mg total) by mouth every 6 (six) hours as needed for nausea. Patient not taking: Reported on 04/23/2021 01/18/21   Natale Milch, MD  traMADol (ULTRAM) 50 MG tablet Take 1 tablet (50 mg total) by mouth every 6 (six) hours as needed. Patient not taking: Reported on 04/23/2021 01/22/21 01/22/22  Natale Milch, MD    Physical Exam Vitals: Blood pressure 124/76, pulse 75, weight 195 lb (88.5 kg), last menstrual period 02/25/2021, unknown if currently breastfeeding.  General: NAD HEENT: normocephalic, anicteric Thyroid: no enlargement, no palpable nodules Pulmonary: No increased work of breathing,  CTAB Cardiovascular: RRR, distal pulses 2+ Abdomen: NABS, soft, non-tender, non-distended.  Umbilicus without lesions.  No hepatomegaly, splenomegaly or masses palpable. No evidence of hernia  Genitourinary:  External: Normal external female genitalia.  Normal urethral meatus, normal  Bartholin's and Skene's glands.    Vagina: Normal vaginal mucosa, no evidence of prolapse.    Cervix: Grossly normal in appearance, no bleeding  Uterus:  deferred  Adnexa: ovaries non-enlarged, no adnexal masses  Rectal: deferred Extremities: no edema, erythema, or tenderness Neurologic: Grossly intact Psychiatric: mood appropriate, affect full PHQ-9 12 GAD-7 14  Assessment: 35 y.o. G3P1001 at [redacted]w[redacted]d presenting to initiate prenatal care  Plan: 1) Avoid alcoholic beverages. 2) Patient encouraged not to smoke.  3) Discontinue the use of all non-medicinal drugs and chemicals.  4) Take prenatal vitamins daily.  5) Nutrition, food safety (fish, cheese advisories, and high nitrite foods) and exercise discussed. 6) Hospital and practice style discussed with cross coverage system.  7) Genetic Screening, such as with 1st Trimester Screening, cell free fetal DNA, AFP testing, and Ultrasound, as well as with amniocentesis and CVS as appropriate, is discussed with patient. At the conclusion of today's visit patient requested genetic testing 8) Patient is asked about travel to areas at risk for the Zika virus, and counseled to avoid travel and exposure to mosquitoes or sexual partners who may have themselves been exposed to the virus. Testing is discussed, and will be ordered as appropriate.  9) Encouraged to look for therapist/counseling, [employer] for sleep, look into Mindful birthing, keep on going dialogue in regards to mood, consider medication if needed.  10). NOB labs and genetic testing at 19 weeks.   Carie Caddy, CNM  [facility], [facility] 04/23/2021, 10:56 AM

## 2021-04-24 ENCOUNTER — Emergency Department: Payer: Medicaid Other

## 2021-04-24 ENCOUNTER — Emergency Department
Admission: EM | Admit: 2021-04-24 | Discharge: 2021-04-24 | Disposition: A | Discharge status: Home / Self Care | Payer: Medicaid Other | Attending: Emergency Medicine | Admitting: Emergency Medicine

## 2021-04-24 ENCOUNTER — Encounter: Payer: Medicaid Other | Admitting: Licensed Practical Nurse

## 2021-04-24 ENCOUNTER — Other Ambulatory Visit: Payer: Self-pay

## 2021-04-24 DIAGNOSIS — O10011 Pre-existing essential hypertension complicating pregnancy, first trimester: Secondary | ICD-10-CM | POA: Diagnosis not present

## 2021-04-24 DIAGNOSIS — O200 Threatened abortion: Secondary | ICD-10-CM

## 2021-04-24 DIAGNOSIS — Z3A01 Less than 8 weeks gestation of pregnancy: Secondary | ICD-10-CM | POA: Diagnosis not present

## 2021-04-24 DIAGNOSIS — N9489 Other specified conditions associated with female genital organs and menstrual cycle: Secondary | ICD-10-CM | POA: Insufficient documentation

## 2021-04-24 DIAGNOSIS — O4690 Antepartum hemorrhage, unspecified, unspecified trimester: Secondary | ICD-10-CM

## 2021-04-24 LAB — WET PREP, GENITAL
Clue Cells Wet Prep HPF POC: NONE SEEN
Sperm: NONE SEEN
Trich, Wet Prep: NONE SEEN
WBC, Wet Prep HPF POC: >10 — AB (ref <10)
Yeast Wet Prep HPF POC: NONE SEEN

## 2021-04-24 LAB — CBC
HCT: 34.3 % — ABNORMAL LOW (ref 36.0–46.0)
Hemoglobin: 10.9 g/dL — ABNORMAL LOW (ref 12.0–15.0)
MCH: 26.5 pg (ref 26.0–34.0)
MCHC: 31.8 g/dL (ref 30.0–36.0)
MCV: 83.3 fL (ref 80.0–100.0)
Platelets: 291 10*3/uL (ref 150–400)
RBC: 4.12 MIL/uL (ref 3.87–5.11)
RDW: 15.7 % — ABNORMAL HIGH (ref 11.5–15.5)
WBC: 9.2 10*3/uL (ref 4.0–10.5)
nRBC: 0 % (ref 0.0–0.2)

## 2021-04-24 LAB — URINALYSIS, COMPLETE (UACMP) WITH MICROSCOPIC
Bacteria, UA: NONE SEEN
Bilirubin Urine: NEGATIVE
Glucose, UA: NEGATIVE mg/dL
Nitrite: NEGATIVE
Protein, ur: NEGATIVE mg/dL
Specific Gravity, Urine: 1.02 (ref 1.005–1.030)
pH: 6.5 (ref 5.0–8.0)

## 2021-04-24 LAB — CHLAMYDIA/NGC RT PCR (ARMC ONLY)
Chlamydia Tr: NOT DETECTED
N gonorrhoeae: NOT DETECTED

## 2021-04-24 LAB — ABO/RH: ABO/RH(D): O NEG

## 2021-04-24 LAB — BASIC METABOLIC PANEL
Anion gap: 4 — ABNORMAL LOW (ref 5–15)
BUN: 7 mg/dL (ref 6–20)
CO2: 21 mmol/L — ABNORMAL LOW (ref 22–32)
Calcium: 8.3 mg/dL — ABNORMAL LOW (ref 8.9–10.3)
Chloride: 108 mmol/L (ref 98–111)
Creatinine, Ser: 0.48 mg/dL (ref 0.44–1.00)
GFR, Estimated: >60 mL/min (ref >60)
Glucose, Bld: 94 mg/dL (ref 70–99)
Potassium: 3.4 mmol/L — ABNORMAL LOW (ref 3.5–5.1)
Sodium: 133 mmol/L — ABNORMAL LOW (ref 135–145)

## 2021-04-24 LAB — HCG, QUANTITATIVE, PREGNANCY: hCG, Beta Chain, Quant, S: 98517 m[IU]/mL — ABNORMAL HIGH (ref <5)

## 2021-04-24 LAB — POC URINE PREG, ED: Preg Test, Ur: POSITIVE — AB

## 2021-04-24 LAB — ANTIBODY SCREEN: Antibody Screen: NEGATIVE

## 2021-04-24 MED ORDER — RHO D IMMUNE GLOBULIN 1500 UNIT/2ML IJ SOSY
300.0000 ug | PREFILLED_SYRINGE | Freq: Once | INTRAMUSCULAR | Status: DC
  Filled 2021-04-24: qty 2

## 2021-04-24 MED ORDER — RHO D IMMUNE GLOBULIN 1500 UNIT/2ML IJ SOSY
300.0000 ug | PREFILLED_SYRINGE | Freq: Once | INTRAMUSCULAR | Status: AC
  Administered 2021-04-24: 18:00:00 300 ug via INTRAMUSCULAR
  Filled 2021-04-24: qty 2

## 2021-04-24 NOTE — ED Triage Notes (Signed)
Pt to ED for vaginal bleeding that started at 1100. [redacted] weeks pregnant. States has not gone through any pads, bleeding when wiped. Reports minimal abd cramping. 3rd pregnancy

## 2021-04-24 NOTE — ED Provider Notes (Signed)
[facility]-EMERGENCY DEPARTMENT  ____________________________________________  Time seen: Approximately 4:32 PM  I have reviewed the triage vital signs and the nursing notes.   HISTORY  Chief Complaint Vaginal Bleeding   Historian Patient     HPI Haley Kramer is a 35 y.o. female G3, P1 presents to the emergency department with vaginal spotting that started at 11 AM.  Patient is approximately [redacted] weeks pregnant with history of recent miscarriage in August 2022.  Patient states that she has had some associated pelvic cramping but no low back pain, abdominal pain, nausea or fever.  She states that her first pregnancy was uncomplicated.     Past Medical History:  Diagnosis Date   Incomplete abortion 01/14/2021   Obesity    PONV (postoperative nausea and vomiting)      Immunizations up to date:  Yes.     Past Medical History:  Diagnosis Date   Incomplete abortion 01/14/2021   Obesity    PONV (postoperative nausea and vomiting)     Patient Active Problem List   Diagnosis Date Noted   Supervision of high risk pregnancy, antepartum 04/23/2021   Depression 04/23/2021   Missed abortion    Benign hypertension 06/13/2020    Past Surgical History:  Procedure Laterality Date   CESAREAN SECTION  03/23/2011   DILATION AND EVACUATION N/A 01/22/2021   Procedure: DILATATION AND EVACUATION;  Surgeon: Natale Milch, MD;  Location: [facility] ORS;  Service: Gynecology;  Laterality: N/A;   ESOPHAGOGASTRODUODENOSCOPY  2016   GASTRIC BYPASS  2016    Prior to Admission medications   Medication Sig Start Date End Date Taking? Authorizing Provider  acetaminophen (TYLENOL) 500 MG tablet Take 500 mg by mouth every 8 (eight) hours as needed for moderate pain.    [provider]  Doxylamine-Pyridoxine (DICLEGIS) 10-10 MG TBEC Take 2 tablets by mouth at bedtime. If symptoms persist, add one tablet in the morning and one in the afternoon 03/29/21   Nadara Mustard, MD  folic  acid (FOLVITE) 800 MCG tablet Take 400 mcg by mouth daily.    [provider]  IRON-B12-VIT C-FA-IFC PO Take 1 tablet by mouth daily.    [provider]  metroNIDAZOLE (FLAGYL) 500 MG tablet Take 1 tablet (500 mg total) by mouth 2 (two) times daily. Patient not taking: Reported on 04/23/2021 02/25/21   Natale Milch, MD  ondansetron (ZOFRAN ODT) 4 MG disintegrating tablet Take 1 tablet (4 mg total) by mouth every 6 (six) hours as needed for nausea. Patient not taking: Reported on 04/23/2021 01/18/21   Natale Milch, MD  traMADol (ULTRAM) 50 MG tablet Take 1 tablet (50 mg total) by mouth every 6 (six) hours as needed. Patient not taking: Reported on 04/23/2021 01/22/21 01/22/22  Natale Milch, MD    Allergies Fish allergy and Pineapple  Family History  Problem Relation Age of Onset   Hypertension Mother    Diabetes Mother    Hypertension Father    Diabetes Father    Hypertension Maternal Grandmother    Diabetes Maternal Grandmother    Hypertension Maternal Grandfather    Diabetes Maternal Grandfather     Social History Social History   Tobacco Use   Smoking status: Never   Smokeless tobacco: Never  Vaping Use   Vaping Use: Never used  Substance Use Topics   Alcohol use: No   Drug use: Never     Review of Systems  Constitutional: No fever/chills Eyes:  No discharge ENT: No upper  respiratory complaints. Respiratory: no cough. No SOB/ use of accessory muscles to breath Gastrointestinal:   No nausea, no vomiting.  No diarrhea.  No constipation. Genitourinary: Patient has vaginal bleeding.  Musculoskeletal: Negative for musculoskeletal pain. Skin: Negative for rash, abrasions, lacerations, ecchymosis.    ____________________________________________   PHYSICAL EXAM:  VITAL SIGNS: ED Triage Vitals [04/24/21 1301]  Enc Vitals Group     BP 133/86     Pulse Rate 90     Resp 18     Temp 98.4 F (36.9 C)     Temp Source Oral      SpO2 100 %     Weight 195 lb (88.5 kg)     Height 5\' 8"  (1.727 m)     Head Circumference      Peak Flow      Pain Score 1     Pain Loc      Pain Edu?      Excl. in GC?      Constitutional: Alert and oriented. Well appearing and in no acute distress. Eyes: Conjunctivae are normal. PERRL. EOMI. Head: Atraumatic. ENT:  Cardiovascular: Normal rate, regular rhythm. Normal S1 and S2.  Good peripheral circulation. Respiratory: Normal respiratory effort without tachypnea or retractions. Lungs CTAB. Good air entry to the bases with no decreased or absent breath sounds Gastrointestinal: Bowel sounds x 4 quadrants. Soft and nontender to palpation. No guarding or rigidity. No distention. Musculoskeletal: Full range of motion to all extremities. No obvious deformities noted Neurologic:  Normal for age. No gross focal neurologic deficits are appreciated.  Skin:  Skin is warm, dry and intact. No rash noted. Psychiatric: Mood and affect are normal for age. Speech and behavior are normal.   ____________________________________________   LABS (all labs ordered are listed, but only abnormal results are displayed)  Labs Reviewed  CBC - Abnormal; Notable for the following components:      Result Value   Hemoglobin 10.9 (*)    HCT 34.3 (*)    RDW 15.7 (*)    All other components within normal limits  BASIC METABOLIC PANEL - Abnormal; Notable for the following components:   Sodium 133 (*)    Potassium 3.4 (*)    CO2 21 (*)    Calcium 8.3 (*)    Anion gap 4 (*)    All other components within normal limits  HCG, QUANTITATIVE, PREGNANCY - Abnormal; Notable for the following components:   hCG, Beta Chain, Quant, S 98,517 (*)    All other components within normal limits  URINALYSIS, COMPLETE (UACMP) WITH MICROSCOPIC - Abnormal; Notable for the following components:   APPearance CLEAR (*)    Hgb urine dipstick SMALL (*)    Ketones, ur TRACE (*)    Leukocytes,Ua SMALL (*)    All other  components within normal limits  POC URINE PREG, ED - Abnormal; Notable for the following components:   Preg Test, Ur Positive (*)    All other components within normal limits  URINE CULTURE  WET PREP, GENITAL  CHLAMYDIA/NGC RT PCR ([facility] ONLY)             ____________________________________________  EKG   ____________________________________________  RADIOLOGY , personally viewed and evaluated these images (plain radiographs) as part of my medical decision making, as well as reviewing the written report by the radiologist.    Geraldo Pitter OB Comp Less 14 Wks  Result Date: 04/24/2021 CLINICAL DATA:  Vaginal bleeding, beta HCG 98,517 EXAM: OBSTETRIC <14 WK  ULTRASOUND TECHNIQUE: Transabdominal ultrasound was performed for evaluation of the gestation as well as the maternal uterus and adnexal regions. COMPARISON:  None. FINDINGS: Intrauterine gestational sac: Single Yolk sac:  Visualized. Embryo:  Visualized. Cardiac Activity: Visualized. Heart Rate: 160 bpm CRL:   14.6 mm   7 w 6 d                  [place] EDC: 12/05/2021 Subchorionic hemorrhage: There is a small subchorionic hemorrhage along the superior aspect of the gestational sac, measuring 1.6 x 1.3 x 0.4 cm. Maternal uterus/adnexae: Exophytic subserosal fibroid left superior aspect measures 2.2 x 1.9 x 2.0 cm. Otherwise the uterus is unremarkable. Right ovary measures 4.3 x 1.7 x 3.3 cm and the left ovary measures 3.8 x 1.9 x 3.1 cm. No free fluid. IMPRESSION: 1. Single live intrauterine pregnancy as above, estimated age 11 weeks and 6 days. 2. Small subchorionic hemorrhage. 3. 2.2 cm subserosal uterine fibroid. Electronically Signed   By: Sharlet Salina M.D.   On: 04/24/2021 16:43    ____________________________________________    PROCEDURES  Procedure(s) performed:     Procedures     Medications  rho (d) immune globulin (RHIG/RHOPHYLAC) injection 300 mcg (has no administration in time range)      ____________________________________________   INITIAL IMPRESSION / ASSESSMENT AND PLAN / ED COURSE  Pertinent labs & imaging results that were available during my care of the patient were reviewed by me and considered in my medical decision making (see chart for details).      Assessment and plan Vaginal bleeding in first trimester 35 year old female presents to the emergency department with vaginal bleeding at [redacted] weeks pregnant.  Vital signs were reassuring at triage.  Mucous membranes appear moist and patient is eating lunch in exam room.  Abdomen is soft and nontender without guarding.  Patient's husband is also in the room.  Differential diagnosis includes ectopic pregnancy, subchorionic hematoma, UTI, spontaneous abortion...  H&H reassuring.  Beta-hCG appropriately elevated.  Dedicated OB ultrasound shows single viable intrauterine pregnancy at 7 weeks 6 days 160 bpm.  Consult with OB/GYN and pelvic exam conducted by attending Dr. Darnelle Catalan.  Patient was given RhoGAM given O- status and advised to follow-up with her OB/GYN.   ____________________________________________  FINAL CLINICAL IMPRESSION(S) / ED DIAGNOSES  Final diagnoses:  Vaginal bleeding in pregnancy      NEW MEDICATIONS STARTED DURING THIS VISIT:  ED Discharge Orders     None           This chart was dictated using voice recognition software/Dragon. Despite best efforts to proofread, errors can occur which can change the meaning. Any change was purely unintentional.     Orvil Feil, PA-C 04/24/21 1714    Gilles Chiquito, MD 04/24/21 [phone]

## 2021-04-24 NOTE — ED Provider Notes (Signed)
[facility] Emergency Department Provider Note  ____________________________________________   Event Date/Time   First MD Initiated Contact with Patient 04/24/21 1641     (approximate)  I have reviewed the triage vital signs and the nursing notes.   HISTORY  Chief Complaint Vaginal Bleeding    HPI Haley Kramer is a 35 y.o. female this is patient's third pregnancy.  She had 1 miscarriage and 1 live birth.  She was seen at [facility] about a week ago for her initial visit.  She started having some suprapubic pain and a little bit of cramping and bleeding today.  She has blood on the toilet tissue when she wipes.  It is reddish.  She is O- and has had RhoGAM with a miscarriage.  I have ordered some more RhoGAM. Also ordered some urine for urinalysis and culture.      Past Medical History:  Diagnosis Date   Incomplete abortion 01/14/2021   Obesity    PONV (postoperative nausea and vomiting)     Patient Active Problem List   Diagnosis Date Noted   Supervision of high risk pregnancy, antepartum 04/23/2021   Depression 04/23/2021   Missed abortion    Benign hypertension 06/13/2020    Past Surgical History:  Procedure Laterality Date   CESAREAN SECTION  03/23/2011   DILATION AND EVACUATION N/A 01/22/2021   Procedure: DILATATION AND EVACUATION;  Surgeon: Natale Milch, MD;  Location: [facility] ORS;  Service: Gynecology;  Laterality: N/A;   ESOPHAGOGASTRODUODENOSCOPY  2016   GASTRIC BYPASS  2016    Prior to Admission medications   Medication Sig Start Date End Date Taking? Authorizing Provider  acetaminophen (TYLENOL) 500 MG tablet Take 500 mg by mouth every 8 (eight) hours as needed for moderate pain.    [provider]  Doxylamine-Pyridoxine (DICLEGIS) 10-10 MG TBEC Take 2 tablets by mouth at bedtime. If symptoms persist, add one tablet in the morning and one in the afternoon 03/29/21   Nadara Mustard, MD  folic acid (FOLVITE) 800  MCG tablet Take 400 mcg by mouth daily.    [provider]  IRON-B12-VIT C-FA-IFC PO Take 1 tablet by mouth daily.    [provider]  metroNIDAZOLE (FLAGYL) 500 MG tablet Take 1 tablet (500 mg total) by mouth 2 (two) times daily. Patient not taking: Reported on 04/23/2021 02/25/21   Natale Milch, MD  ondansetron (ZOFRAN ODT) 4 MG disintegrating tablet Take 1 tablet (4 mg total) by mouth every 6 (six) hours as needed for nausea. Patient not taking: Reported on 04/23/2021 01/18/21   Natale Milch, MD  traMADol (ULTRAM) 50 MG tablet Take 1 tablet (50 mg total) by mouth every 6 (six) hours as needed. Patient not taking: Reported on 04/23/2021 01/22/21 01/22/22  Natale Milch, MD    Allergies Fish allergy and Pineapple  Family History  Problem Relation Age of Onset   Hypertension Mother    Diabetes Mother    Hypertension Father    Diabetes Father    Hypertension Maternal Grandmother    Diabetes Maternal Grandmother    Hypertension Maternal Grandfather    Diabetes Maternal Grandfather     Social History Social History   Tobacco Use   Smoking status: Never   Smokeless tobacco: Never  Vaping Use   Vaping Use: Never used  Substance Use Topics   Alcohol use: No   Drug use: Never    Review of Systems  Constitutional: No fever/chills Eyes: No visual changes.  ENT: No sore throat. Cardiovascular: Denies chest pain. Respiratory: Denies shortness of breath. Gastrointestinal: Suprapubic only abdominal pain.  No nausea, no vomiting.  No diarrhea.  No constipation. Genitourinary: Negative for dysuria. Musculoskeletal: Negative for back pain. Skin: Negative for rash. Neurological: Negative for headaches, focal weakness    ____________________________________________   PHYSICAL EXAM:  VITAL SIGNS: ED Triage Vitals [04/24/21 1301]  Enc Vitals Group     BP 133/86     Pulse Rate 90     Resp 18     Temp 98.4 F (36.9 C)     Temp Source  Oral     SpO2 100 %     Weight 195 lb (88.5 kg)     Height 5\' 8"  (1.727 m)     Head Circumference      Peak Flow      Pain Score 1     Pain Loc      Pain Edu?      Excl. in GC?     Constitutional: Alert and oriented. Well appearing and in no acute distress. Eyes: Conjunctivae are normal. PERRL. EOMI. Head: Atraumatic. Nose: No congestion/rhinnorhea. Mouth/Throat: Mucous membranes are moist.  Oropharynx non-erythematous. Neck: No stridor. Cardiovascular: Normal rate, regular rhythm. Grossly normal heart sounds.  Good peripheral circulation. Respiratory: Normal respiratory effort.  No retractions. Lungs CTAB. Gastrointestinal: Soft and nontender. No distention. No abdominal bruits.  Genitourinary: Normal perineum.  Normal vagina.  Parous looking cervix.  No active bleeding currently and no old blood in the vagina.  No bleeding visible from anywhere else either. Musculoskeletal: No lower extremity tenderness nor edema.  No joint effusions. Neurologic:  Normal speech and language. No gross focal neurologic deficits are appreciated. No gait instability. Skin:  Skin is warm, dry and intact. No rash noted. Psychiatric: Mood and affect are normal. Speech and behavior are normal.  ____________________________________________   LABS (all labs ordered are listed, but only abnormal results are displayed)  Labs Reviewed  WET PREP, GENITAL - Abnormal; Notable for the following components:      Result Value   WBC, Wet Prep HPF POC >10 (*)    All other components within normal limits  CBC - Abnormal; Notable for the following components:   Hemoglobin 10.9 (*)    HCT 34.3 (*)    RDW 15.7 (*)    All other components within normal limits  BASIC METABOLIC PANEL - Abnormal; Notable for the following components:   Sodium 133 (*)    Potassium 3.4 (*)    CO2 21 (*)    Calcium 8.3 (*)    Anion gap 4 (*)    All other components within normal limits  HCG, QUANTITATIVE, PREGNANCY - Abnormal;  Notable for the following components:   hCG, Beta Chain, Quant, S 98,517 (*)    All other components within normal limits  URINALYSIS, COMPLETE (UACMP) WITH MICROSCOPIC - Abnormal; Notable for the following components:   APPearance CLEAR (*)    Hgb urine dipstick SMALL (*)    Ketones, ur TRACE (*)    Leukocytes,Ua SMALL (*)    All other components within normal limits  POC URINE PREG, ED - Abnormal; Notable for the following components:   Preg Test, Ur Positive (*)    All other components within normal limits  URINE CULTURE  CHLAMYDIA/NGC RT PCR ([facility] ONLY)            ABO/RH  ANTIBODY SCREEN  RHOGAM INJECTION   ____________________________________________  EKG   ____________________________________________  RADIOLOGY I, Arnaldo Natal, personally viewed and evaluated these images (plain radiographs) as part of my medical decision making, as well as reviewing the written report by the radiologist.  ED MD interpretation: Ultrasound read by radiology reviewed by me shows a small subchorionic hemorrhage.  Small subserosal fibroid and a single live IUP 7 weeks 6 days.  Official radiology report(s): US OB Comp Less 14 Wks  Result Date: 04/24/2021 CLINICAL DATA:  Vaginal bleeding, beta HCG 98,517 EXAM: OBSTETRIC <14 WK ULTRASOUND TECHNIQUE: Transabdominal ultrasound was performed for evaluation of the gestation as well as the maternal uterus and adnexal regions. COMPARISON:  None. FINDINGS: Intrauterine gestational sac: Single Yolk sac:  Visualized. Embryo:  Visualized. Cardiac Activity: Visualized. Heart Rate: 160 bpm CRL:   14.6 mm   7 w 6 d                  [place] EDC: 12/05/2021 Subchorionic hemorrhage: There is a small subchorionic hemorrhage along the superior aspect of the gestational sac, measuring 1.6 x 1.3 x 0.4 cm. Maternal uterus/adnexae: Exophytic subserosal fibroid left superior aspect measures 2.2 x 1.9 x 2.0 cm. Otherwise the uterus is unremarkable. Right ovary measures 4.3 x  1.7 x 3.3 cm and the left ovary measures 3.8 x 1.9 x 3.1 cm. No free fluid. IMPRESSION: 1. Single live intrauterine pregnancy as above, estimated age 60 weeks and 6 days. 2. Small subchorionic hemorrhage. 3. 2.2 cm subserosal uterine fibroid. Electronically Signed   By: Sharlet Salina M.D.   On: 04/24/2021 16:43    ____________________________________________   PROCEDURES  Procedure(s) performed (including Critical Care):  Procedures   ____________________________________________   INITIAL IMPRESSION / ASSESSMENT AND PLAN / ED COURSE   ----------------------------------------- 5:55 PM on 04/24/2021 ----------------------------------------- He has paged [facility] twice.  Apparently they are busy because they have not answered.  We will let the patient go.  She is stable.  I have asked her to call [facility] tomorrow for a follow-up in 2 days.  She will return if she is worse including worsening bleeding or pain or lightheadedness.  I have given her RhoGAM.  GC and chlamydia done as a matter former pending.             ____________________________________________   FINAL CLINICAL IMPRESSION(S) / ED DIAGNOSES  Final diagnoses:  Vaginal bleeding in pregnancy  Threatened miscarriage     ED Discharge Orders     None        Note:  This document was prepared using Dragon voice recognition software and may include unintentional dictation errors.    Arnaldo Natal, MD 04/24/21 [phone]

## 2021-04-24 NOTE — Discharge Instructions (Addendum)
Anytime there is bleeding in early pregnancy we call it a threatened miscarriage.  Ultrasound shows there is a little bit of bleeding between the placenta and the of wall of the uterus.  Very often this goes away.  Occasionally this will worsen and could cause a miscarriage.  Please call your doctor at [facility].  Let them know you are seen in the emergency department.  They should check on you in about 2 days.  Please return here for increasing pain or any fever or vomiting or heavy bleeding.  Also return here if you get lightheaded.  Follow the threatened miscarriage instructions and have given you.

## 2021-04-25 LAB — GC/CHLAMYDIA PROBE AMP
Chlamydia trachomatis, NAA: NEGATIVE
Neisseria Gonorrhoeae by PCR: NEGATIVE

## 2021-04-25 LAB — RHOGAM INJECTION: Unit division: 0

## 2021-04-25 LAB — URINE CULTURE: Culture: <10000 — AB

## 2021-04-26 ENCOUNTER — Encounter: Payer: Medicaid Other | Admitting: Obstetrics and Gynecology

## 2021-04-26 LAB — URINE CULTURE: Organism ID, Bacteria: NO GROWTH

## 2021-04-30 ENCOUNTER — Telehealth: Payer: Self-pay

## 2021-04-30 NOTE — Telephone Encounter (Signed)
Malachi Bonds, Preg Case Managemer at [facility], calling to let us know pt has declined being seen for high risk OB.  Aggie Cosier can be reached at [phone]

## 2021-05-02 ENCOUNTER — Ambulatory Visit (INDEPENDENT_AMBULATORY_CARE_PROVIDER_SITE_OTHER): Payer: Medicaid Other | Admitting: Obstetrics and Gynecology

## 2021-05-02 ENCOUNTER — Other Ambulatory Visit: Payer: Self-pay

## 2021-05-02 ENCOUNTER — Encounter: Payer: Self-pay | Admitting: Obstetrics and Gynecology

## 2021-05-02 VITALS — BP 130/70 | Ht 63.0 in | Wt 196.6 lb

## 2021-05-02 DIAGNOSIS — Z3A09 9 weeks gestation of pregnancy: Secondary | ICD-10-CM

## 2021-05-02 DIAGNOSIS — N96 Recurrent pregnancy loss: Secondary | ICD-10-CM

## 2021-05-02 DIAGNOSIS — N760 Acute vaginitis: Secondary | ICD-10-CM

## 2021-05-02 DIAGNOSIS — O0990 Supervision of high risk pregnancy, unspecified, unspecified trimester: Secondary | ICD-10-CM

## 2021-05-02 DIAGNOSIS — B9689 Other specified bacterial agents as the cause of diseases classified elsewhere: Secondary | ICD-10-CM

## 2021-05-02 MED ORDER — PROGESTERONE 200 MG PO CAPS: 200.0000 mg | ORAL_CAPSULE | Freq: Every day | ORAL | 0 refills | Status: DC

## 2021-05-02 MED ORDER — METRONIDAZOLE 0.75 % VA GEL: 1.0000 | Freq: Every day | VAGINAL | 0 refills | Status: DC

## 2021-05-02 NOTE — Patient Instructions (Signed)
First Trimester of Pregnancy °The first trimester of pregnancy starts on the first day of your last menstrual period until the end of week 12. This is months 1 through 3 of pregnancy. A week after a sperm fertilizes an egg, the egg will implant into the wall of the uterus and begin to develop into a baby. By the end of 12 weeks, all the baby's organs will be formed and the baby will be 2-3 inches in size. °Body changes during your first trimester °Your body goes through many changes during pregnancy. The changes vary and generally return to normal after your baby is born. °Physical changes °You may gain or lose weight. °Your breasts may begin to grow larger and become tender. The tissue that surrounds your nipples (areola) may become darker. °Dark spots or blotches (chloasma or mask of pregnancy) may develop on your face. °You may have changes in your hair. These can include thickening or thinning of your hair or changes in texture. °Health changes °You may feel nauseous, and you may vomit. °You may have heartburn. °You may develop headaches. °You may develop constipation. °Your gums may bleed and may be sensitive to brushing and flossing. °Other changes °You may tire easily. °You may urinate more often. °Your menstrual periods will stop. °You may have a loss of appetite. °You may develop cravings for certain kinds of food. °You may have changes in your emotions from day to day. °You may have more vivid and strange dreams. °Follow these instructions at home: °Medicines °Follow your health care provider's instructions regarding medicine use. Specific medicines may be either safe or unsafe to take during pregnancy. Do not take any medicines unless told to by your health care provider. °Take a prenatal vitamin that contains at least 600 micrograms (mcg) of folic acid. °Eating and drinking °Eat a healthy diet that includes fresh fruits and vegetables, whole grains, good sources of protein such as meat, eggs, or tofu,  and low-fat dairy products. °Avoid raw meat and unpasteurized juice, milk, and cheese. These carry germs that can harm you and your baby. °If you feel nauseous or you vomit: °Eat 4 or 5 small meals a day instead of 3 large meals. °Try eating a few soda crackers. °Drink liquids between meals instead of during meals. °You may need to take these actions to prevent or treat constipation: °Drink enough fluid to keep your urine pale yellow. °Eat foods that are high in fiber, such as beans, whole grains, and fresh fruits and vegetables. °Limit foods that are high in fat and processed sugars, such as fried or sweet foods. °Activity °Exercise only as directed by your health care provider. Most people can continue their usual exercise routine during pregnancy. Try to exercise for 30 minutes at least 5 days a week. °Stop exercising if you develop pain or cramping in the lower abdomen or lower back. °Avoid exercising if it is very hot or humid or if you are at high altitude. °Avoid heavy lifting. °If you choose to, you may have sex unless your health care provider tells you not to. °Relieving pain and discomfort °Wear a good support bra to relieve breast tenderness. °Rest with your legs elevated if you have leg cramps or low back pain. °If you develop bulging veins (varicose veins) in your legs: °Wear support hose as told by your health care provider. °Elevate your feet for 15 minutes, 3-4 times a day. °Limit salt in your diet. °Safety °Wear your seat belt at all times when driving   or riding in a car. °Talk with your health care provider if someone is verbally or physically abusive to you. °Talk with your health care provider if you are feeling sad or have thoughts of hurting yourself. °Lifestyle °Do not use hot tubs, steam rooms, or saunas. °Do not douche. Do not use tampons or scented sanitary pads. °Do not use herbal remedies, alcohol, illegal drugs, or medicines that are not approved by your health care provider. Chemicals  in these products can harm your baby. °Do not use any products that contain nicotine or tobacco, such as cigarettes, e-cigarettes, and chewing tobacco. If you need help quitting, ask your health care provider. °Avoid cat litter boxes and soil used by cats. These carry germs that can cause birth defects in the baby and possibly loss of the unborn baby (fetus) by miscarriage or stillbirth. °General instructions °During routine prenatal visits in the first trimester, your health care provider will do a physical exam, perform necessary tests, and ask you how things are going. Keep all follow-up visits. This is important. °Ask for help if you have counseling or nutritional needs during pregnancy. Your health care provider can offer advice or refer you to specialists for help with various needs. °Schedule a dentist appointment. At home, brush your teeth with a soft toothbrush. Floss gently. °Write down your questions. Take them to your prenatal visits. °Where to find more information °American Pregnancy Association: americanpregnancy.org °American College of Obstetricians and Gynecologists: acog.org/en/Womens%20Health/Pregnancy °Office on Women's Health: womenshealth.gov/pregnancy °Contact a health care provider if you have: °Dizziness. °A fever. °Mild pelvic cramps, pelvic pressure, or nagging pain in the abdominal area. °Nausea, vomiting, or diarrhea that lasts for 24 hours or longer. °A bad-smelling vaginal discharge. °Pain when you urinate. °Known exposure to a contagious illness, such as chickenpox, measles, Zika virus, HIV, or hepatitis. °Get help right away if you have: °Spotting or bleeding from your vagina. °Severe abdominal cramping or pain. °Shortness of breath or chest pain. °Any kind of trauma, such as from a fall or a car crash. °New or increased pain, swelling, or redness in an arm or leg. °Summary °The first trimester of pregnancy starts on the first day of your last menstrual period until the end of week  12 (months 1 through 3). °Eating 4 or 5 small meals a day rather than 3 large meals may help to relieve nausea and vomiting. °Do not use any products that contain nicotine or tobacco, such as cigarettes, e-cigarettes, and chewing tobacco. If you need help quitting, ask your health care provider. °Keep all follow-up visits. This is important. °This information is not intended to replace advice given to you by your health care provider. Make sure you discuss any questions you have with your health care provider. °Document Revised: 10/05/2019 Document Reviewed: 08/11/2019 °Elsevier Patient Education © 2022 [employer] Inc. ° °

## 2021-05-03 ENCOUNTER — Encounter: Payer: Self-pay | Admitting: Obstetrics and Gynecology

## 2021-05-03 NOTE — Progress Notes (Signed)
Routine Prenatal Care Visit  Subjective  Haley Kramer is a 35 y.o. G3P1001 at [redacted]w[redacted]d being seen today for ongoing prenatal care.  She is currently monitored for the following issues for this high-risk pregnancy and has Benign hypertension; Missed abortion; Supervision of high risk pregnancy, antepartum; and Depression on their problem list.  ----------------------------------------------------------------------------------- Patient reports  she was seen in the ER and told she has subchorionic hemorrhage.  She has been resting at home.  She notes no continued bleeding and minimal pelvic pain.    Contractions: Not present. Vag. Bleeding: None.   . Denies leaking of fluid.  ----------------------------------------------------------------------------------- The following portions of the patient's history were reviewed and updated as appropriate: allergies, current medications, past family history, past medical history, past social history, past surgical history and problem list. Problem list updated.   Objective  Blood pressure 130/70, height 5\' 3"  (1.6 m), weight 196 lb 9.6 oz (89.2 kg), last menstrual period 02/25/2021, unknown if currently breastfeeding. Pregravid weight 205 lb (93 kg) Total Weight Gain -8 lb 6.4 oz (-3.81 kg) Urinalysis:      Fetal Status: Fetal Heart Rate (bpm): 170         General:  Alert, oriented and cooperative. Patient is in no acute distress.  Skin: Skin is warm and dry. No rash noted.   Cardiovascular: Normal heart rate noted  Respiratory: Normal respiratory effort, no problems with respiration noted  Abdomen: Soft, gravid, appropriate for gestational age. Pain/Pressure: Absent     Pelvic:  Cervical exam deferred        Extremities: Normal range of motion.     Mental Status: Normal mood and affect. Normal behavior. Normal judgment and thought content.     Assessment   35 y.o. G3P1001 at [redacted]w[redacted]d by  12/02/2021, by Last Menstrual Period presenting for routine  prenatal visit  Plan   pregnancy 3 Problems (from 04/23/21 to present)     Problem Noted Resolved   Supervision of high risk pregnancy, antepartum 04/23/2021 by Allen Derry, CNM No   Overview Addendum 05/03/2021  2:37 PM by Homero Fellers, MD       Nursing Staff Provider  Office Location  [facility] Dating    Language  English  Anatomy US    Flu Vaccine  04/23/21 Genetic Screen  NIPS:   TDaP vaccine   Hgb A1C or  GTT Early : Third trimester :   Covid    LAB RESULTS   Rhogam   Blood Type --/--/O NEG Performed at [facility], [address] [place], [place] [address]  [phone])   Feeding Plan  Antibody NEG Performed at [facility], [address] [place], [place] [address]  [phone])  Contraception  Rubella    Circumcision  RPR     Pediatrician   HBsAg     Support Person  HIV    Prenatal Classes Marjory Lies  Varicella     GBS  (For PCN allergy, check sensitivities)   BTL Consent     VBAC Consent  Pap      Hgb Electro    Pelvis Tested  CF      SMA         Obesity HX LTCS  AMA         Patient has history of prior miscarriages- would like to trial progesterone until [redacted] weeks gestation. Prescription sent.   Will need NOB labs and bariatric vitamin level labs at next visit  Abdominal bedside US today showed IUP with +FHR 170 bpm  Gestational age appropriate obstetric precautions including but not limited to vaginal bleeding, contractions, leaking of fluid and fetal movement were reviewed in detail with the patient.    Return for ROB weekly for 4 weeks.  Natale Milch MD [facility], [facility] 05/03/2021, 2:36 PM

## 2021-05-10 ENCOUNTER — Other Ambulatory Visit: Payer: Self-pay

## 2021-05-10 ENCOUNTER — Ambulatory Visit (INDEPENDENT_AMBULATORY_CARE_PROVIDER_SITE_OTHER): Payer: Medicaid Other | Admitting: Obstetrics and Gynecology

## 2021-05-10 ENCOUNTER — Encounter: Payer: Self-pay | Admitting: Obstetrics and Gynecology

## 2021-05-10 VITALS — BP 120/74 | Wt 197.2 lb

## 2021-05-10 DIAGNOSIS — O0990 Supervision of high risk pregnancy, unspecified, unspecified trimester: Secondary | ICD-10-CM

## 2021-05-10 DIAGNOSIS — Z1371 Encounter for nonprocreative screening for genetic disease carrier status: Secondary | ICD-10-CM

## 2021-05-10 DIAGNOSIS — Z9884 Bariatric surgery status: Secondary | ICD-10-CM

## 2021-05-10 DIAGNOSIS — Z1379 Encounter for other screening for genetic and chromosomal anomalies: Secondary | ICD-10-CM

## 2021-05-10 NOTE — Progress Notes (Signed)
Routine Prenatal Care Visit  Subjective  Haley Kramer is a 35 y.o. G3P1011 at [redacted]w[redacted]d being seen today for ongoing prenatal care.  She is currently monitored for the following issues for this high-risk pregnancy and has Benign hypertension; Missed abortion; Supervision of high risk pregnancy, antepartum; and Depression on their problem list.  ----------------------------------------------------------------------------------- Patient reports  she felt a popping sensation yesterday went to the ER to be seen.  She is feeling very fatigued.  She is taking prenatal vitamin.  Her bariatric vitamin is upsetting her stomach.  She is able to start the vaginal progesterone medication.  She reports minimal vaginal spotting which is stable.   Contractions: Not present. Vag. Bleeding: None.   . Denies leaking of fluid.  ----------------------------------------------------------------------------------- The following portions of the patient's history were reviewed and updated as appropriate: allergies, current medications, past family history, past medical history, past social history, past surgical history and problem list. Problem list updated.   Objective  Blood pressure 120/74, weight 197 lb 3.2 oz (89.4 kg), last menstrual period 02/25/2021, unknown if currently breastfeeding. Pregravid weight 205 lb (93 kg) Total Weight Gain -7 lb 12.8 oz (-3.538 kg) Urinalysis:      Fetal Status: Fetal Heart Rate (bpm): 163         General:  Alert, oriented and cooperative. Patient is in no acute distress.  Skin: Skin is warm and dry. No rash noted.   Cardiovascular: Normal heart rate noted  Respiratory: Normal respiratory effort, no problems with respiration noted  Abdomen: Soft, gravid, appropriate for gestational age. Pain/Pressure: Absent     Pelvic:  Cervical exam deferred        Extremities: Normal range of motion.  Edema: None  Mental Status: Normal mood and affect. Normal behavior. Normal judgment  and thought content.     Assessment   35 y.o. G3P1011 at [redacted]w[redacted]d by  12/02/2021, by Last Menstrual Period presenting for routine prenatal visit  Plan   pregnancy 3 Problems (from 04/23/21 to present)     Problem Noted Resolved   Supervision of high risk pregnancy, antepartum 04/23/2021 by Ellwood Sayers, CNM No   Overview Addendum 05/03/2021  2:44 PM by Natale Milch, MD       Nursing Staff Provider  Office Location  [facility] Dating    Language  English  Anatomy US    Flu Vaccine  04/23/21 Genetic Screen  NIPS:   TDaP vaccine   Hgb A1C or  GTT Early : Third trimester :   Covid    LAB RESULTS   Rhogam   Blood Type O NEG  Feeding Plan  Antibody NEG  Contraception  Rubella    Circumcision  RPR     Pediatrician   HBsAg     Support Person  HIV    Prenatal Classes Aaron  Varicella     GBS  (For PCN allergy, check sensitivities)   BTL Consent     VBAC Consent  Pap      Hgb Electro    Pelvis Tested  CF      SMA         Obesity HX LTCS  AMA History of gastric bypass          Bedside ultrasound showed active IUP.  New OB labs today and genetic screening labs.  Vitamin labs.  Gestational age appropriate obstetric precautions including but not limited to vaginal bleeding, contractions, leaking of fluid and fetal movement were reviewed in detail with  the patient.    Return in about 1 week (around 05/17/2021) for ROB in person.  Natale Milch MD [facility], [facility] 05/10/2021, 11:51 AM

## 2021-05-12 NOTE — L&D Delivery Note (Signed)
Indication for operative vaginal delivery: fetal bradycardia suboptimal maternal effort  Patient was examined and found to be fully dilated with fetal station of +3.  Patient's bladder was noted to be empty with foley still in place, and there were no known fetal contraindications to operative vaginal delivery. EFW was 2413 grams by recent ultrasound.  FHR tracing remarkable for fetal bradycardia to the 60's and recurrent late decelerations.  Risks of vacuum assistance were discussed in detail, including but not limited to, bleeding, infection, damage to maternal tissues, fetal cephalohematoma, inability to effect vaginal delivery of the head or shoulder dystocia that cannot be resolved by established maneuvers and need for emergency cesarean section.  Patient gave verbal consent.  The soft vacuum cup was positioned over the sagittal suture 3 cm anterior to posterior fontanelle.  Pressure was then increased to the green zone, and the patient was instructed to push.  Pulling was administered along the pelvic curve while patient was pushing; there were 3 contractions and 1 popoffs.  Vacuum was reduced in between contractions.  The infant was then delivered atraumatically, noted to be a viable female infant, Apgars of 2 and 3 and 8 at ten minutes.  Neonatology present for delivery.  There was spontaneous placental delivery with fundal massage, intact with three-vessel cord.  During delivery of the placenta a large amount of clot was noted attached to the placenta, suspicious for abruption.  Bilateral labial lacerations  were noted and repaired using 2-0 vicryl.  Patient also had a stellate 1 st degree tear also repaired using 2-0 and 3-0 vicryl under epidural anesthesia.   EBL in the room was 862 ml. Sponge, instrument and needle counts were correct x 2. The patient continued to have significant bleeding, and decision was made for repair in the OR  for better exposure, lighting and suction .  The  baby was taken to  the NICU by neonatology staff stable after delivery .  Pt was taken to OR for repair after verbal consent.  Mariel Aloe, MD, FACOG Obstetrician & Gynecologist, [facility] for [facility], [facility]

## 2021-05-15 ENCOUNTER — Other Ambulatory Visit: Payer: Self-pay

## 2021-05-15 ENCOUNTER — Encounter: Payer: Self-pay | Admitting: Obstetrics and Gynecology

## 2021-05-15 ENCOUNTER — Ambulatory Visit (INDEPENDENT_AMBULATORY_CARE_PROVIDER_SITE_OTHER): Payer: Medicaid Other | Admitting: Obstetrics and Gynecology

## 2021-05-15 VITALS — BP 120/70 | Ht 63.0 in | Wt 196.4 lb

## 2021-05-15 DIAGNOSIS — O99019 Anemia complicating pregnancy, unspecified trimester: Secondary | ICD-10-CM | POA: Insufficient documentation

## 2021-05-15 DIAGNOSIS — Z3A11 11 weeks gestation of pregnancy: Secondary | ICD-10-CM

## 2021-05-15 DIAGNOSIS — O0990 Supervision of high risk pregnancy, unspecified, unspecified trimester: Secondary | ICD-10-CM

## 2021-05-15 DIAGNOSIS — E559 Vitamin D deficiency, unspecified: Secondary | ICD-10-CM | POA: Insufficient documentation

## 2021-05-15 DIAGNOSIS — D509 Iron deficiency anemia, unspecified: Secondary | ICD-10-CM

## 2021-05-15 LAB — POCT URINALYSIS DIPSTICK OB
Glucose, UA: NEGATIVE
POC,PROTEIN,UA: NEGATIVE

## 2021-05-15 LAB — CBC/D/PLT+RPR+RH+ABO+RUBIGG...
Basophils Absolute: 0 10*3/uL (ref 0.0–0.2)
Basos: 0 %
EOS (ABSOLUTE): 0.1 10*3/uL (ref 0.0–0.4)
Eos: 1 %
HCV Ab: 0.2 s/co ratio (ref 0.0–0.9)
HIV Screen 4th Generation wRfx: NONREACTIVE
Hematocrit: 35.1 % (ref 34.0–46.6)
Hemoglobin: 11.2 g/dL (ref 11.1–15.9)
Hepatitis B Surface Ag: NEGATIVE
Immature Grans (Abs): 0 10*3/uL (ref 0.0–0.1)
Immature Granulocytes: 0 %
Lymphocytes Absolute: 2.1 10*3/uL (ref 0.7–3.1)
Lymphs: 20 %
MCH: 26.3 pg — ABNORMAL LOW (ref 26.6–33.0)
MCHC: 31.9 g/dL (ref 31.5–35.7)
MCV: 82 fL (ref 79–97)
Monocytes Absolute: 0.8 10*3/uL (ref 0.1–0.9)
Monocytes: 7 %
Neutrophils Absolute: 7.9 10*3/uL — ABNORMAL HIGH (ref 1.4–7.0)
Neutrophils: 72 %
Platelets: 303 10*3/uL (ref 150–450)
RBC: 4.26 x10E6/uL (ref 3.77–5.28)
RDW: 14.5 % (ref 11.7–15.4)
RPR Ser Ql: NONREACTIVE
Rh Factor: NEGATIVE
Rubella Antibodies, IGG: 6.16 index (ref >0.99)
Varicella zoster IgG: 2980 index (ref >165)
WBC: 11 10*3/uL — ABNORMAL HIGH (ref 3.4–10.8)

## 2021-05-15 LAB — HGB FRACTIONATION CASCADE
Hgb A2: 2.6 % (ref 1.8–3.2)
Hgb A: 97.4 % (ref 96.4–98.8)
Hgb F: 0 % (ref 0.0–2.0)
Hgb S: 0 %

## 2021-05-15 LAB — HCV INTERPRETATION

## 2021-05-15 LAB — VITAMIN D 25 HYDROXY (VIT D DEFICIENCY, FRACTURES): Vit D, 25-Hydroxy: 4.4 ng/mL — ABNORMAL LOW (ref 30.0–100.0)

## 2021-05-15 LAB — VITAMIN B12: Vitamin B-12: 491 pg/mL (ref 232–1245)

## 2021-05-15 LAB — VITAMIN B1

## 2021-05-15 LAB — IRON AND TIBC
Iron Saturation: 10 % — ABNORMAL LOW (ref 15–55)
Iron: 48 ug/dL (ref 27–159)
Total Iron Binding Capacity: 482 ug/dL — ABNORMAL HIGH (ref 250–450)
UIBC: 434 ug/dL — ABNORMAL HIGH (ref 131–425)

## 2021-05-15 LAB — HEMOGLOBIN A1C
Est. average glucose Bld gHb Est-mCnc: 94 mg/dL
Hgb A1c MFr Bld: 4.9 % (ref 4.8–5.6)

## 2021-05-15 LAB — FOLATE: Folate: 18.9 ng/mL (ref >3.0)

## 2021-05-15 LAB — AB SCR+ANTIBODY ID: Antibody Screen: POSITIVE — AB

## 2021-05-15 LAB — FERRITIN: Ferritin: 13 ng/mL — ABNORMAL LOW (ref 15–150)

## 2021-05-15 LAB — CALCIUM: Calcium: 8.7 mg/dL (ref 8.7–10.2)

## 2021-05-15 NOTE — Progress Notes (Signed)
Routine Prenatal Care Visit  Subjective  Haley Kramer is a 36 y.o. G3P1011 at [redacted]w[redacted]d being seen today for ongoing prenatal care.  She is currently monitored for the following issues for this high-risk pregnancy and has Benign hypertension; Missed abortion; Supervision of high risk pregnancy, antepartum; Depression; Iron deficiency anemia; and Vitamin D deficiency on their problem list.  ----------------------------------------------------------------------------------- Patient reports no complaints.   Contractions: Not present. Vag. Bleeding: None.   . Denies leaking of fluid.  ----------------------------------------------------------------------------------- The following portions of the patient's history were reviewed and updated as appropriate: allergies, current medications, past family history, past medical history, past social history, past surgical history and problem list. Problem list updated.   Objective  Blood pressure 120/70, height 5\' 3"  (1.6 m), weight 196 lb 6.4 oz (89.1 kg), last menstrual period 02/25/2021, unknown if currently breastfeeding. Pregravid weight 205 lb (93 kg) Total Weight Gain -8 lb 9.6 oz (-3.901 kg) Urinalysis:      Fetal Status: Fetal Heart Rate (bpm): 180         General:  Alert, oriented and cooperative. Patient is in no acute distress.  Skin: Skin is warm and dry. No rash noted.   Cardiovascular: Normal heart rate noted  Respiratory: Normal respiratory effort, no problems with respiration noted  Abdomen: Soft, gravid, appropriate for gestational age. Pain/Pressure: Absent     Pelvic:  Cervical exam deferred        Extremities: Normal range of motion.  Edema: None  Mental Status: Normal mood and affect. Normal behavior. Normal judgment and thought content.     Assessment   36 y.o. G3P1011 at [redacted]w[redacted]d by  12/02/2021, by Last Menstrual Period presenting for routine prenatal visit  Plan   pregnancy 3 Problems (from 04/23/21 to present)      Problem Noted Resolved   Supervision of high risk pregnancy, antepartum 04/23/2021 by 04/25/2021, CNM No   Overview Addendum 05/15/2021  1:34 PM by 07/13/2021, MD       Nursing Staff Provider  Office Location  [facility] Dating  LMP = 7 wk Natale Milch  Language  English  Anatomy US    Flu Vaccine  04/23/21 Genetic Screen  NIPS:   TDaP vaccine   Hgb A1C or  GTT Early : Third trimester :   Covid    LAB RESULTS   Rhogam   Blood Type O NEG  Feeding Plan  Antibody NEG  Contraception  Rubella    Circumcision  RPR     Pediatrician   HBsAg     Support Person 04/25/21 HIV    Prenatal Classes   Varicella     GBS    BTL Consent     VBAC Consent  Pap  2022 NIL HPV negative    Hgb Electro    Pelvis Tested  CF      SMA         Obesity HX LTCS  AMA History of gastric bypass          Reviewed preliminary blood results from [facility].  Low  ferritin 13- patient reports fatigue. History of gastric bypass. Referral to hematology placed.  Low vitamin D- encouraged oral supplementation with 1000 IU. Repeat testing in 3-4 months.   Gestational age appropriate obstetric precautions including but not limited to vaginal bleeding, contractions, leaking of fluid and fetal movement were reviewed in detail with the patient.    Return in about 1 week (around 05/22/2021) for ROB as planned.  Natale Milch MD [facility], [facility] 05/15/2021, 1:42 PM

## 2021-05-17 ENCOUNTER — Encounter: Payer: Self-pay | Admitting: Obstetrics and Gynecology

## 2021-05-17 ENCOUNTER — Encounter: Payer: Self-pay | Admitting: *Deleted

## 2021-05-18 LAB — MATERNIT21 PLUS CORE+SCA
Fetal Fraction: 5
Monosomy X (Turner Syndrome): NOT DETECTED
Result (T21): NEGATIVE
Trisomy 13 (Patau syndrome): NEGATIVE
Trisomy 18 (Edwards syndrome): NEGATIVE
Trisomy 21 (Down syndrome): NEGATIVE
XXX (Triple X Syndrome): NOT DETECTED
XXY (Klinefelter Syndrome): NOT DETECTED
XYY (Jacobs Syndrome): NOT DETECTED

## 2021-05-20 ENCOUNTER — Encounter: Payer: Self-pay | Admitting: Obstetrics and Gynecology

## 2021-05-20 ENCOUNTER — Other Ambulatory Visit: Payer: Self-pay

## 2021-05-20 ENCOUNTER — Ambulatory Visit (INDEPENDENT_AMBULATORY_CARE_PROVIDER_SITE_OTHER): Payer: Medicaid Other | Admitting: Obstetrics and Gynecology

## 2021-05-20 VITALS — BP 110/70 | Ht 63.0 in | Wt 195.2 lb

## 2021-05-20 DIAGNOSIS — E559 Vitamin D deficiency, unspecified: Secondary | ICD-10-CM

## 2021-05-20 DIAGNOSIS — Z3A12 12 weeks gestation of pregnancy: Secondary | ICD-10-CM

## 2021-05-20 DIAGNOSIS — O0990 Supervision of high risk pregnancy, unspecified, unspecified trimester: Secondary | ICD-10-CM

## 2021-05-20 MED ORDER — HM VITAMIN D3 100 MCG (4000 UT) PO CAPS: 1.0000 | ORAL_CAPSULE | Freq: Every day | ORAL | 0 refills | Status: DC

## 2021-05-20 NOTE — Progress Notes (Signed)
° ° °  Routine Prenatal Care Visit  Subjective  Haley Kramer is a 36 y.o. G3P1011 at [redacted]w[redacted]d being seen today for ongoing prenatal care.  She is currently monitored for the following issues for this high-risk pregnancy and has Benign hypertension; Missed abortion; Supervision of high risk pregnancy, antepartum; Depression; Iron deficiency anemia; and Vitamin D deficiency on their problem list.  ----------------------------------------------------------------------------------- Patient reports no complaints.   Contractions: Not present. Vag. Bleeding: None.   . Denies leaking of fluid.  ----------------------------------------------------------------------------------- The following portions of the patient's history were reviewed and updated as appropriate: allergies, current medications, past family history, past medical history, past social history, past surgical history and problem list. Problem list updated.   Objective  Blood pressure 110/70, height 5\' 3"  (1.6 m), weight 195 lb 3.2 oz (88.5 kg), last menstrual period 02/25/2021, unknown if currently breastfeeding. Pregravid weight 205 lb (93 kg) Total Weight Gain -9 lb 12.8 oz (-4.445 kg) Urinalysis:      Fetal Status: Fetal Heart Rate (bpm): 174         General:  Alert, oriented and cooperative. Patient is in no acute distress.  Skin: Skin is warm and dry. No rash noted.   Cardiovascular: Normal heart rate noted  Respiratory: Normal respiratory effort, no problems with respiration noted  Abdomen: Soft, gravid, appropriate for gestational age. Pain/Pressure: Absent     Pelvic:  Cervical exam deferred        Extremities: Normal range of motion.  Edema: None  Mental Status: Normal mood and affect. Normal behavior. Normal judgment and thought content.     Assessment   36 y.o. G3P1011 at [redacted]w[redacted]d by  12/02/2021, by Last Menstrual Period presenting for routine prenatal visit  Plan   pregnancy 3 Problems (from 04/23/21 to present)      Problem Noted Resolved   Supervision of high risk pregnancy, antepartum 04/23/2021 by 04/25/2021, CNM No   Overview Addendum 05/15/2021  1:42 PM by 07/13/2021, MD       Nursing Staff Provider  Office Location  [facility] Dating  LMP = 7 wk Natale Milch  Language  English  Anatomy US    Flu Vaccine  04/23/21 Genetic Screen  NIPS:   TDaP vaccine   Hgb A1C or  GTT Early : 4.9 Third trimester :   Covid    LAB RESULTS   Rhogam   Blood Type O NEG  Feeding Plan  Antibody NEG  Contraception  Rubella    Circumcision  RPR     Pediatrician   HBsAg     Support Person 04/25/21 HIV    Prenatal Classes   Varicella  Immune    GBS    BTL Consent     VBAC Consent  Pap  2022 NIL HPV negative    Hgb Electro   normal  Pelvis Tested  CF      SMA         Obesity HX LTCS  AMA History of gastric bypass          Vitamin D 4,000 IUs a day, rx sent- repeat in 2-3 months  Gestational age appropriate obstetric precautions including but not limited to vaginal bleeding, contractions, leaking of fluid and fetal movement were reviewed in detail with the patient.    Return in about 2 weeks (around 06/03/2021) for rob in person.  06/05/2021 MD [facility], [facility] 05/20/2021, 1:33 PM

## 2021-05-24 ENCOUNTER — Inpatient Hospital Stay: Payer: Medicaid Other | Attending: Oncology | Admitting: Oncology

## 2021-05-24 ENCOUNTER — Inpatient Hospital Stay: Payer: Medicaid Other

## 2021-05-24 ENCOUNTER — Encounter: Payer: Self-pay | Admitting: Oncology

## 2021-05-24 ENCOUNTER — Other Ambulatory Visit: Payer: Self-pay

## 2021-05-24 VITALS — BP 128/84 | HR 93 | Temp 97.0°F | Resp 18 | Wt 195.0 lb

## 2021-05-24 DIAGNOSIS — D508 Other iron deficiency anemias: Secondary | ICD-10-CM | POA: Insufficient documentation

## 2021-05-24 DIAGNOSIS — Z9884 Bariatric surgery status: Secondary | ICD-10-CM | POA: Insufficient documentation

## 2021-05-24 DIAGNOSIS — D509 Iron deficiency anemia, unspecified: Secondary | ICD-10-CM

## 2021-05-24 DIAGNOSIS — O99011 Anemia complicating pregnancy, first trimester: Secondary | ICD-10-CM | POA: Insufficient documentation

## 2021-05-24 NOTE — Progress Notes (Signed)
Hematology/Oncology Consult note [facility] Telephone:([phone] [phone]  Patient Care Team: [place], Julie A, MD as PCP - General (Family Medicine) Sindy Guadeloupe, MD as Consulting Physician (Hematology and Oncology) Homero Fellers, MD as Consulting Physician (Obstetrics and Gynecology)   Name of the patient: Haley Kramer  KU:[id number]  10/18/1985    Reason for referral-iron deficiency anemia in pregnancy   Referring physician-Dr. Gilman Schmidt  Date of visit: 05/24/21   History of presenting illness-patient is a 36 year old femaleWho will be soon [redacted] weeks pregnant.  She is G3, P1.  She has a prior history of gastric bypass.  She has been on oral iron for about 8 months now which she takes every other day.Her most recent CBC from 05/10/2021 showed white cell count of 11, H&H of 11.2/35.1 and a platelet count of 303.  Ferritin levels were low at 13.  Iron and TIBC showed elevated TIBC of 482 with an iron saturation of 10%.  B12 levels were normal at 491.  Folate levels were normal.  Patient currently reports feeling fatigued but denies other complaints at this time.  She does report feeling constipated with daily iron.  ECOG PS- 0    Review of systems- Review of Systems  Constitutional:  Positive for malaise/fatigue. Negative for chills, fever and weight loss.  HENT:  Negative for congestion, ear discharge and nosebleeds.   Eyes:  Negative for blurred vision.  Respiratory:  Negative for cough, hemoptysis, sputum production, shortness of breath and wheezing.   Cardiovascular:  Negative for chest pain, palpitations, orthopnea and claudication.  Gastrointestinal:  Negative for abdominal pain, blood in stool, constipation, diarrhea, heartburn, melena, nausea and vomiting.  Genitourinary:  Negative for dysuria, flank pain, frequency, hematuria and urgency.  Musculoskeletal:  Negative for back pain, joint pain and myalgias.  Skin:  Negative for rash.   Neurological:  Negative for dizziness, tingling, focal weakness, seizures, weakness and headaches.  Endo/Heme/Allergies:  Does not bruise/bleed easily.  Psychiatric/Behavioral:  Negative for depression and suicidal ideas. The patient does not have insomnia.    Allergies  Allergen Reactions   Fish Allergy Hives   Pineapple Hives    Patient Active Problem List   Diagnosis Date Noted   Iron deficiency anemia 05/15/2021   Vitamin D deficiency 05/15/2021   Supervision of high risk pregnancy, antepartum 04/23/2021   Depression 04/23/2021   Missed abortion    Benign hypertension 06/13/2020     Past Medical History:  Diagnosis Date   IDA (iron deficiency anemia)    Incomplete abortion 01/14/2021   Iron deficiency anemia, unspecified iron deficiency anemia type    Obesity    PONV (postoperative nausea and vomiting)    Vitamin D deficiency      Past Surgical History:  Procedure Laterality Date   CESAREAN SECTION  03/23/2011   DILATION AND EVACUATION N/A 01/22/2021   Procedure: DILATATION AND EVACUATION;  Surgeon: Homero Fellers, MD;  Location: [facility] ORS;  Service: Gynecology;  Laterality: N/A;   ESOPHAGOGASTRODUODENOSCOPY  2016   GASTRIC BYPASS  2016    Social History   Socioeconomic History   Marital status: Single    Spouse name: Not on file   Number of children: 1   Years of education: Not on file   Highest education level: Not on file  Occupational History   Not on file  Tobacco Use   Smoking status: Never   Smokeless tobacco: Never  Vaping Use   Vaping Use: Never used  Substance and Sexual Activity   Alcohol use: No   Drug use: Never   Sexual activity: Yes    Partners: Male    Birth control/protection: None    Comment: hx mirena x 10 yr/removed 05/11/2020  Other Topics Concern   Not on file  Social History Narrative   Lives with mother and child   Social Determinants of Health   Financial Resource Strain: Not on file  Food Insecurity: Not on  file  Transportation Needs: Not on file  Physical Activity: Not on file  Stress: Not on file  Social Connections: Not on file  Intimate Partner Violence: Not At Risk   Fear of Current or Ex-Partner: No   Emotionally Abused: No   Physically Abused: No   Sexually Abused: No     Family History  Problem Relation Age of Onset   Hypertension Mother    Diabetes Mother    Hypertension Father    Diabetes Father    Hypertension Maternal Grandmother    Diabetes Maternal Grandmother    Hypertension Maternal Grandfather    Diabetes Maternal Grandfather      Current Outpatient Medications:    acetaminophen (TYLENOL) 500 MG tablet, Take 500 mg by mouth every 8 (eight) hours as needed for moderate pain., Disp: , Rfl:    Cholecalciferol (HM VITAMIN D3) 100 MCG (4000 UT) CAPS, Take 1 capsule (4,000 Units total) by mouth daily., Disp: 90 capsule, Rfl: 0   doxylamine, Sleep, (UNISOM) 25 MG tablet, Take 50 mg by mouth at bedtime as needed., Disp: , Rfl:    Doxylamine-Pyridoxine (DICLEGIS) 10-10 MG TBEC, Take 2 tablets by mouth at bedtime. If symptoms persist, add one tablet in the morning and one in the afternoon, Disp: 100 tablet, Rfl: 5   folic acid (FOLVITE) Q000111Q MCG tablet, Take 400 mcg by mouth daily., Disp: , Rfl:    IRON-B12-VIT C-FA-IFC PO, Take 1 tablet by mouth daily., Disp: , Rfl:    metroNIDAZOLE (METROGEL) 0.75 % vaginal gel, Place 1 Applicatorful vaginally at bedtime. Apply one applicatorful to vagina at bedtime for 5 days, Disp: 70 g, Rfl: 0   Prenatal Multivit-Min-Fe-FA (PRE-NATAL PO), Take by mouth., Disp: , Rfl:    progesterone (PROMETRIUM) 200 MG capsule, Place 1 capsule (200 mg total) vaginally daily., Disp: 30 capsule, Rfl: 0   Physical exam:  Vitals:   05/24/21 1055  BP: 128/84  Pulse: 93  Resp: 18  Temp: (!) 97 F (36.1 C)  TempSrc: Tympanic  SpO2: 100%  Weight: 195 lb (88.5 kg)   Physical Exam Constitutional:      General: She is not in acute  distress. Cardiovascular:     Rate and Rhythm: Normal rate and regular rhythm.     Heart sounds: Normal heart sounds.  Pulmonary:     Effort: Pulmonary effort is normal.     Breath sounds: Normal breath sounds.  Abdominal:     General: Bowel sounds are normal.     Palpations: Abdomen is soft.  Skin:    General: Skin is warm and dry.  Neurological:     Mental Status: She is alert and oriented to person, place, and time.       CMP Latest Ref Rng & Units 05/10/2021  Glucose 70 - 99 mg/dL -  BUN 6 - 20 mg/dL -  Creatinine 0.44 - 1.00 mg/dL -  Sodium 135 - 145 mmol/L -  Potassium 3.5 - 5.1 mmol/L -  Chloride 98 - 111 mmol/L -  CO2  22 - 32 mmol/L -  Calcium 8.7 - 10.2 mg/dL 8.7   CBC Latest Ref Rng & Units 05/10/2021  WBC 3.4 - 10.8 x10E3/uL 11.0(H)  Hemoglobin 11.1 - 15.9 g/dL 11.2  Hematocrit 34.0 - 46.6 % 35.1  Platelets 150 - 450 x10E3/uL 303    No images are attached to the encounter.  US OB Comp Less 14 Wks  Result Date: 04/24/2021 CLINICAL DATA:  Vaginal bleeding, beta HCG 98,517 EXAM: OBSTETRIC <14 WK ULTRASOUND TECHNIQUE: Transabdominal ultrasound was performed for evaluation of the gestation as well as the maternal uterus and adnexal regions. COMPARISON:  None. FINDINGS: Intrauterine gestational sac: Single Yolk sac:  Visualized. Embryo:  Visualized. Cardiac Activity: Visualized. Heart Rate: 160 bpm CRL:   14.6 mm   7 w 6 d                  [place] EDC: 12/05/2021 Subchorionic hemorrhage: There is a small subchorionic hemorrhage along the superior aspect of the gestational sac, measuring 1.6 x 1.3 x 0.4 cm. Maternal uterus/adnexae: Exophytic subserosal fibroid left superior aspect measures 2.2 x 1.9 x 2.0 cm. Otherwise the uterus is unremarkable. Right ovary measures 4.3 x 1.7 x 3.3 cm and the left ovary measures 3.8 x 1.9 x 3.1 cm. No free fluid. IMPRESSION: 1. Single live intrauterine pregnancy as above, estimated age 17 weeks and 6 days. 2. Small subchorionic hemorrhage. 3.  2.2 cm subserosal uterine fibroid. Electronically Signed   By: Randa Ngo M.D.   On: 04/24/2021 16:43    Assessment and plan- Patient is a 36 y.o. female referred for iron deficiency anemia in pregnancy  Patient will be completing her first trimester by the end of this week.  Discussed that in general we do not offer IV iron in the first trimester.  I would wait for about 2 weeks and then start IV iron.  Patient prefers IV iron given her ongoing fatigue as well as constipation with oral iron and history of gastric bypass.  I would recommend 5 doses of Venofer 200 mg IV given over 3 weeks.  Discussed risks and benefits of IV iron including all but not limited to possible risk of infusion and anaphylactic reactions.  Patient understands and agrees to proceed as planned.  I will see her back in 8 weeks for a video visit with CBC ferritin and iron studies   Thank you for this kind referral and the opportunity to participate in the care of this patient   Visit Diagnosis 1. Iron deficiency anemia, unspecified iron deficiency anemia type     Dr. Randa Evens, MD, MPH [facility] [id number] 05/24/2021

## 2021-05-24 NOTE — Progress Notes (Signed)
Patient here for initial oncology appointment, pregnant , expresses extreme fatigue and nausea

## 2021-05-26 ENCOUNTER — Encounter: Payer: Self-pay | Admitting: Obstetrics and Gynecology

## 2021-05-26 ENCOUNTER — Other Ambulatory Visit: Payer: Self-pay | Admitting: Obstetrics and Gynecology

## 2021-05-26 DIAGNOSIS — N96 Recurrent pregnancy loss: Secondary | ICD-10-CM

## 2021-05-26 NOTE — Addendum Note (Signed)
Addended by: Corene Cornea on: 05/26/2021 06:05 PM   Modules accepted: Orders

## 2021-05-27 ENCOUNTER — Other Ambulatory Visit: Payer: Self-pay | Admitting: Obstetrics and Gynecology

## 2021-05-27 DIAGNOSIS — O210 Mild hyperemesis gravidarum: Secondary | ICD-10-CM

## 2021-05-27 LAB — INHERITEST CORE(CF97,SMA,FRAX)

## 2021-05-27 MED ORDER — ONDANSETRON 4 MG PO TBDP: 4.0000 mg | ORAL_TABLET | Freq: Four times a day (QID) | ORAL | 3 refills | Status: DC | PRN

## 2021-05-28 ENCOUNTER — Other Ambulatory Visit: Payer: Self-pay | Admitting: Obstetrics and Gynecology

## 2021-05-28 ENCOUNTER — Telehealth: Payer: Self-pay

## 2021-05-28 DIAGNOSIS — N96 Recurrent pregnancy loss: Secondary | ICD-10-CM

## 2021-05-28 MED ORDER — PROGESTERONE 200 MG PO CAPS: 200.0000 mg | ORAL_CAPSULE | Freq: Every day | ORAL | 0 refills | Status: DC

## 2021-05-28 NOTE — Telephone Encounter (Signed)
Anderson Malta from [employer]; pt has abnormal Inheritest; if not recv'd please call [phone]  We have recv'd it.

## 2021-05-31 ENCOUNTER — Ambulatory Visit (INDEPENDENT_AMBULATORY_CARE_PROVIDER_SITE_OTHER): Payer: Medicaid Other | Admitting: Obstetrics and Gynecology

## 2021-05-31 ENCOUNTER — Encounter: Payer: Self-pay | Admitting: Obstetrics and Gynecology

## 2021-05-31 ENCOUNTER — Other Ambulatory Visit: Payer: Self-pay

## 2021-05-31 VITALS — BP 118/74 | Ht 67.0 in | Wt 196.6 lb

## 2021-05-31 DIAGNOSIS — O0990 Supervision of high risk pregnancy, unspecified, unspecified trimester: Secondary | ICD-10-CM

## 2021-05-31 DIAGNOSIS — Z148 Genetic carrier of other disease: Secondary | ICD-10-CM

## 2021-05-31 DIAGNOSIS — Z3A13 13 weeks gestation of pregnancy: Secondary | ICD-10-CM

## 2021-05-31 NOTE — Progress Notes (Signed)
Routine Prenatal Care Visit  Subjective  Haley Kramer is a 36 y.o. G3P1011 at 934w4d being seen today for ongoing prenatal care.  She is currently monitored for the following issues for this high-risk pregnancy and has Benign hypertension; Missed abortion; Supervision of high risk pregnancy, antepartum; Depression; Iron deficiency anemia; and Vitamin D deficiency on their problem list.  ----------------------------------------------------------------------------------- Patient reports no complaints.   Contractions: Not present. Vag. Bleeding: None.  Movement: Absent. Denies leaking of fluid.  ----------------------------------------------------------------------------------- The following portions of the patient's history were reviewed and updated as appropriate: allergies, current medications, past family history, past medical history, past social history, past surgical history and problem list. Problem list updated.   Objective  Blood pressure 118/74, height 5\' 7"  (1.702 m), weight 196 lb 9.6 oz (89.2 kg), last menstrual period 02/25/2021, unknown if currently breastfeeding. Pregravid weight 205 lb (93 kg) Total Weight Gain -8 lb 6.4 oz (-3.81 kg) Urinalysis:      Fetal Status: Fetal Heart Rate (bpm): 160   Movement: Absent     General:  Alert, oriented and cooperative. Patient is in no acute distress.  Skin: Skin is warm and dry. No rash noted.   Cardiovascular: Normal heart rate noted  Respiratory: Normal respiratory effort, no problems with respiration noted  Abdomen: Soft, gravid, appropriate for gestational age. Pain/Pressure: Absent     Pelvic:  Cervical exam deferred        Extremities: Normal range of motion.  Edema: None  Mental Status: Normal mood and affect. Normal behavior. Normal judgment and thought content.     Assessment   36 y.o. G3P1011 at 264w4d by  12/02/2021, by Last Menstrual Period presenting for routine prenatal visit  Plan   pregnancy 3 Problems  (from 04/23/21 to present)     Problem Noted Resolved   Supervision of high risk pregnancy, antepartum 04/23/2021 by Ellwood Sayersominic, Lydia Marie, CNM No   Overview Addendum 05/20/2021  1:35 PM by Natale MilchSchuman, Derrall Hicks R, MD       Nursing Staff Provider  Office Location  [facility] Dating  LMP = 7 wk US  Language  English  Anatomy US    Flu Vaccine  04/23/21 Genetic Screen  NIPS: normal XY  TDaP vaccine   Hgb A1C or  GTT Early : 4.9 Third trimester :   Covid    LAB RESULTS   Rhogam   Blood Type O NEG  Feeding Plan  Antibody NEG  Contraception  Rubella 6.16 (12/30 1154)  Circumcision  RPR Non Reactive (12/30 1154)   Pediatrician   HBsAg Negative (12/30 1154)   Support Person Clifton Custardaron HIV Non Reactive (12/30 1154)  Prenatal Classes   Varicella  Immune    GBS    BTL Consent     VBAC Consent  Pap  2022 NIL HPV negative    Hgb Electro   normal  Pelvis Tested  CF      SMA         Obesity HX LTCS  AMA History of gastric bypass Vitamin D deficiency Iron deficiency anemia          Referral to genetic counselor for + SMA screen   Gestational age appropriate obstetric precautions including but not limited to vaginal bleeding, contractions, leaking of fluid and fetal movement were reviewed in detail with the patient.    Return in about 2 weeks (around 06/14/2021) for ROB in person.  Natale Milchhristanna R Chassie Pennix MD [facility], [facility] 05/31/2021, 9:45 AM

## 2021-06-06 ENCOUNTER — Other Ambulatory Visit: Payer: Self-pay

## 2021-06-06 ENCOUNTER — Inpatient Hospital Stay: Payer: Medicaid Other

## 2021-06-06 VITALS — BP 114/69 | HR 79 | Temp 97.0°F

## 2021-06-06 DIAGNOSIS — D508 Other iron deficiency anemias: Secondary | ICD-10-CM

## 2021-06-06 MED ORDER — SODIUM CHLORIDE 0.9 % IV SOLN: 200.0000 mg | INTRAVENOUS | Status: DC

## 2021-06-06 MED ORDER — SODIUM CHLORIDE 0.9 % IV SOLN
Freq: Once | INTRAVENOUS | Status: AC
  Filled 2021-06-06: qty 250

## 2021-06-06 MED ORDER — IRON SUCROSE 20 MG/ML IV SOLN
200.0000 mg | Freq: Once | INTRAVENOUS | Status: AC
  Administered 2021-06-06: 200 mg via INTRAVENOUS
  Filled 2021-06-06: qty 10

## 2021-06-06 NOTE — Patient Instructions (Signed)

## 2021-06-11 ENCOUNTER — Other Ambulatory Visit: Payer: Self-pay

## 2021-06-11 ENCOUNTER — Inpatient Hospital Stay: Payer: Medicaid Other

## 2021-06-11 VITALS — BP 115/83 | HR 100 | Temp 97.1°F | Resp 16

## 2021-06-11 DIAGNOSIS — D508 Other iron deficiency anemias: Secondary | ICD-10-CM | POA: Diagnosis not present

## 2021-06-11 MED ORDER — SODIUM CHLORIDE 0.9 % IV SOLN: 200.0000 mg | INTRAVENOUS | Status: DC

## 2021-06-11 MED ORDER — SODIUM CHLORIDE 0.9 % IV SOLN
Freq: Once | INTRAVENOUS | Status: AC
  Filled 2021-06-11: qty 250

## 2021-06-11 MED ORDER — IRON SUCROSE 20 MG/ML IV SOLN
200.0000 mg | Freq: Once | INTRAVENOUS | Status: AC
  Administered 2021-06-11: 200 mg via INTRAVENOUS
  Filled 2021-06-11: qty 10

## 2021-06-11 NOTE — Patient Instructions (Signed)

## 2021-06-13 ENCOUNTER — Other Ambulatory Visit: Payer: Self-pay

## 2021-06-13 ENCOUNTER — Inpatient Hospital Stay: Payer: Medicaid Other | Attending: Oncology

## 2021-06-13 VITALS — BP 111/76 | HR 77 | Temp 98.6°F | Resp 18

## 2021-06-13 DIAGNOSIS — D508 Other iron deficiency anemias: Secondary | ICD-10-CM | POA: Diagnosis not present

## 2021-06-13 DIAGNOSIS — O99011 Anemia complicating pregnancy, first trimester: Secondary | ICD-10-CM | POA: Diagnosis present

## 2021-06-13 MED ORDER — IRON SUCROSE 20 MG/ML IV SOLN
200.0000 mg | Freq: Once | INTRAVENOUS | Status: AC
  Administered 2021-06-13: 200 mg via INTRAVENOUS
  Filled 2021-06-13: qty 10

## 2021-06-13 MED ORDER — SODIUM CHLORIDE 0.9 % IV SOLN
Freq: Once | INTRAVENOUS | Status: AC
  Filled 2021-06-13: qty 250

## 2021-06-13 MED ORDER — SODIUM CHLORIDE 0.9 % IV SOLN: 200.0000 mg | INTRAVENOUS | Status: DC

## 2021-06-13 NOTE — Progress Notes (Signed)
Pt received IV venofer in clinic today. Tolerated well. VSS @ d/c. 

## 2021-06-13 NOTE — Patient Instructions (Signed)
[facility]   °Discharge Instructions: °Thank you for choosing [facility] to provide your oncology and hematology care.  °If you have a lab appointment with the Cancer Center, please go directly to the Cancer Center and check in at the registration area. ° °Wear comfortable clothing and clothing appropriate for easy access to any Portacath or PICC line.  ° °We strive to give you quality time with your provider. You may need to reschedule your appointment if you arrive late (15 or more minutes).  Arriving late affects you and other patients whose appointments are after yours.  Also, if you miss three or more appointments without notifying the office, you may be dismissed from the clinic at the provider’s discretion.    °  °For prescription refill requests, have your pharmacy contact our office and allow 72 hours for refills to be completed.   ° °BELOW ARE SYMPTOMS THAT SHOULD BE REPORTED IMMEDIATELY: °*FEVER GREATER THAN 100.4 F (38 °C) OR HIGHER °*CHILLS OR SWEATING °*NAUSEA AND VOMITING THAT IS NOT CONTROLLED WITH YOUR NAUSEA MEDICATION °*UNUSUAL SHORTNESS OF BREATH °*UNUSUAL BRUISING OR BLEEDING °*URINARY PROBLEMS (pain or burning when urinating, or frequent urination) °*BOWEL PROBLEMS (unusual diarrhea, constipation, pain near the anus) °TENDERNESS IN MOUTH AND THROAT WITH OR WITHOUT PRESENCE OF ULCERS (sore throat, sores in mouth, or a toothache) °UNUSUAL RASH, SWELLING OR PAIN  °UNUSUAL VAGINAL DISCHARGE OR ITCHING  ° °Items with * indicate a potential emergency and should be followed up as soon as possible or go to the Emergency Department if any problems should occur. ° °Please show the CHEMOTHERAPY ALERT CARD or IMMUNOTHERAPY ALERT CARD at check-in to the Emergency Department and triage nurse. ° °Should you have questions after your visit or need to cancel or reschedule your appointment, please contact [facility]   [phone] and follow the prompts.  Office hours are 8:00 a.m. to 4:30 p.m. Monday - Friday. Please note that voicemails left after 4:00 p.m. may not be returned until the following business day.  We are closed weekends and major holidays. You have access to a nurse at all times for urgent questions. Please call the main number to the clinic [phone] and follow the prompts. ° °For any non-urgent questions, you may also contact your provider using MyChart. We now offer e-Visits for anyone 18 and older to request care online for non-urgent symptoms. For details visit [email or web address]. °  °Also download the MyChart app! Go to the app store, search "MyChart", open the app, select [place], and log in with your MyChart username and password. ° °Due to Covid, a mask is required upon entering the hospital/clinic. If you do not have a mask, one will be given to you upon arrival. For doctor visits, patients may have 1 support person aged 18 or older with them. For treatment visits, patients cannot have anyone with them due to current Covid guidelines and our immunocompromised population.  ° °Iron Sucrose Injection °What is this medication? °IRON SUCROSE (EYE ern SOO krose) treats low levels of iron (iron deficiency anemia) in people with kidney disease. Iron is a mineral that plays an important role in making red blood cells, which carry oxygen from your lungs to the rest of your body. °This medicine may be used for other purposes; ask your health care provider or pharmacist if you have questions. °COMMON BRAND NAME(S): Venofer °What should I tell my care team before I take this medication? °They need   to know if you have any of these conditions: °Anemia not caused by low iron levels °Heart disease °High levels of iron in the blood °Kidney disease °Liver disease °An unusual or allergic reaction to iron, other medications, foods, dyes, or preservatives °Pregnant or trying to get pregnant °Breast-feeding °How  should I use this medication? °This medication is for infusion into a vein. It is given in a hospital or clinic setting. °Talk to your care team about the use of this medication in children. While this medication may be prescribed for children as young as 2 years for selected conditions, precautions do apply. °Overdosage: If you think you have taken too much of this medicine contact a poison control center or emergency room at once. °NOTE: This medicine is only for you. Do not share this medicine with others. °What if I miss a dose? °It is important not to miss your dose. Call your care team if you are unable to keep an appointment. °What may interact with this medication? °Do not take this medication with any of the following: °Deferoxamine °Dimercaprol °Other iron products °This medication may also interact with the following: °Chloramphenicol °Deferasirox °This list may not describe all possible interactions. Give your health care provider a list of all the medicines, herbs, non-prescription drugs, or dietary supplements you use. Also tell them if you smoke, drink alcohol, or use illegal drugs. Some items may interact with your medicine. °What should I watch for while using this medication? °Visit your care team regularly. Tell your care team if your symptoms do not start to get better or if they get worse. You may need blood work done while you are taking this medication. °You may need to follow a special diet. Talk to your care team. Foods that contain iron include: whole grains/cereals, dried fruits, beans, or peas, leafy green vegetables, and organ meats (liver, kidney). °What side effects may I notice from receiving this medication? °Side effects that you should report to your care team as soon as possible: °Allergic reactions--skin rash, itching, hives, swelling of the face, lips, tongue, or throat °Low blood pressure--dizziness, feeling faint or lightheaded, blurry vision °Shortness of breath °Side effects  that usually do not require medical attention (report to your care team if they continue or are bothersome): °Flushing °Headache °Joint pain °Muscle pain °Nausea °Pain, redness, or irritation at injection site °This list may not describe all possible side effects. Call your doctor for medical advice about side effects. You may report side effects to FDA at 1-800-FDA-1088. °Where should I keep my medication? °This medication is given in a hospital or clinic and will not be stored at home. °NOTE: This sheet is a summary. It may not cover all possible information. If you have questions about this medicine, talk to your doctor, pharmacist, or health care provider. °© 2022 [employer] (2020-09-21 00:00:00) ° °

## 2021-06-17 ENCOUNTER — Ambulatory Visit (INDEPENDENT_AMBULATORY_CARE_PROVIDER_SITE_OTHER): Payer: Medicaid Other | Admitting: Obstetrics and Gynecology

## 2021-06-17 ENCOUNTER — Other Ambulatory Visit: Payer: Self-pay

## 2021-06-17 VITALS — BP 128/70 | Ht 67.0 in | Wt 198.0 lb

## 2021-06-17 DIAGNOSIS — Z3A16 16 weeks gestation of pregnancy: Secondary | ICD-10-CM

## 2021-06-17 DIAGNOSIS — O0990 Supervision of high risk pregnancy, unspecified, unspecified trimester: Secondary | ICD-10-CM

## 2021-06-17 NOTE — Progress Notes (Signed)
Routine Prenatal Care Visit  Subjective  Haley Kramer is a 36 y.o. G3P1011 at [redacted]w[redacted]d being seen today for ongoing prenatal care.  She is currently monitored for the following issues for this high-risk pregnancy and has Benign hypertension; Missed abortion; Supervision of high risk pregnancy, antepartum; Depression; Iron deficiency anemia; and Vitamin D deficiency on their problem list.  ----------------------------------------------------------------------------------- Patient reports no complaints.   Contractions: Not present. Vag. Bleeding: None.  Movement: Absent. Denies leaking of fluid.  ----------------------------------------------------------------------------------- The following portions of the patient's history were reviewed and updated as appropriate: allergies, current medications, past family history, past medical history, past social history, past surgical history and problem list. Problem list updated.   Objective  Blood pressure 128/70, height 5\' 7"  (1.702 m), weight 198 lb (89.8 kg), last menstrual period 02/25/2021, unknown if currently breastfeeding. Pregravid weight 205 lb (93 kg) Total Weight Gain -7 lb (-3.175 kg) Urinalysis:      Fetal Status: Fetal Heart Rate (bpm): 153   Movement: Absent     General:  Alert, oriented and cooperative. Patient is in no acute distress.  Skin: Skin is warm and dry. No rash noted.   Cardiovascular: Normal heart rate noted  Respiratory: Normal respiratory effort, no problems with respiration noted  Abdomen: Soft, gravid, appropriate for gestational age. Pain/Pressure: Absent     Pelvic:  Cervical exam deferred        Extremities: Normal range of motion.  Edema: None  Mental Status: Normal mood and affect. Normal behavior. Normal judgment and thought content.     Assessment   36 y.o. G3P1011 at [redacted]w[redacted]d by  12/02/2021, by Last Menstrual Period presenting for routine prenatal visit  Plan   pregnancy 3 Problems (from 04/23/21  to present)     Problem Noted Resolved   Supervision of high risk pregnancy, antepartum 04/23/2021 by Allen Derry, CNM No   Overview Addendum 05/31/2021  9:44 AM by Homero Fellers, MD       Nursing Staff Provider  Office Location  [facility] Dating  LMP = 7 wk [place]  Language  English  Anatomy US    Flu Vaccine  04/23/21 Genetic Screen  NIPS: normal XY  TDaP vaccine   Hgb A1C or  GTT Early : 4.9 Third trimester :   Covid    LAB RESULTS   Rhogam   Blood Type O NEG  Feeding Plan  Antibody NEG  Contraception  Rubella 6.16 (12/30 1154)  Circumcision  RPR Non Reactive (12/30 1154)   Pediatrician   HBsAg Negative (12/30 1154)   Support Person Marjory Lies HIV Non Reactive (12/30 1154)  Prenatal Classes   Varicella  Immune    GBS    BTL Consent     VBAC Consent  Pap  2022 NIL HPV negative    Hgb Electro   normal  Pelvis Tested  CF  negative     SMA carrier        Obesity HX LTCS  AMA History of gastric bypass Vitamin D deficiency Iron deficiency anemia           Genetic counseling appointment is upcoming.  Anatomy US ordered  Gestational age appropriate obstetric precautions including but not limited to vaginal bleeding, contractions, leaking of fluid and fetal movement were reviewed in detail with the patient.    Return in about 4 weeks (around 07/15/2021) for ROB in person.  Homero Fellers MD [facility] 06/17/2021, 1:33 PM

## 2021-06-18 ENCOUNTER — Ambulatory Visit: Payer: Medicaid Other | Attending: Obstetrics and Gynecology

## 2021-06-18 ENCOUNTER — Inpatient Hospital Stay: Payer: Medicaid Other

## 2021-06-18 ENCOUNTER — Other Ambulatory Visit: Payer: Self-pay

## 2021-06-18 VITALS — BP 104/61 | HR 76 | Temp 97.5°F | Resp 16

## 2021-06-18 VITALS — BP 128/76 | HR 92 | Temp 97.7°F | Ht 67.0 in | Wt 197.5 lb

## 2021-06-18 DIAGNOSIS — O0990 Supervision of high risk pregnancy, unspecified, unspecified trimester: Secondary | ICD-10-CM

## 2021-06-18 DIAGNOSIS — D508 Other iron deficiency anemias: Secondary | ICD-10-CM

## 2021-06-18 DIAGNOSIS — Z8279 Family history of other congenital malformations, deformations and chromosomal abnormalities: Secondary | ICD-10-CM | POA: Diagnosis not present

## 2021-06-18 DIAGNOSIS — Z148 Genetic carrier of other disease: Secondary | ICD-10-CM

## 2021-06-18 MED ORDER — SODIUM CHLORIDE 0.9 % IV SOLN: 200.0000 mg | INTRAVENOUS | Status: DC

## 2021-06-18 MED ORDER — IRON SUCROSE 20 MG/ML IV SOLN
200.0000 mg | Freq: Once | INTRAVENOUS | Status: AC
  Administered 2021-06-18: 200 mg via INTRAVENOUS
  Filled 2021-06-18: qty 10

## 2021-06-18 MED ORDER — SODIUM CHLORIDE 0.9 % IV SOLN
Freq: Once | INTRAVENOUS | Status: AC
  Filled 2021-06-18: qty 250

## 2021-06-18 NOTE — Progress Notes (Signed)
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 She was seen at this visit with her partner, Charmaine Downs (dob 02/24/1982), who also had Inheritest drawn prior to this visit.  His results are negative, as described below.  Spinal Muscular Atrophy (SMA) is an autosomal recessive disease caused by loss of function mutations in the SMN1 gene. Individuals with SMA have progressive, proximal, and symmetrical muscle weakness and atrophy due to degeneration of motor neurons of the spine and brainstem. Symptoms can first appear at any time from  before birth to adulthood. There are five types of SMA that are historically classified based on clinical presentation, onset of symptoms, and the maximum skill level of motor milestones achieved, however, there is a lot of overlap between types. There are several treatments available for individuals affected with SMA and studies show that treatment is most effective when it is started in the first few months of life.   Ms. 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 [facility] was able to test her partner, Clifton Custard, to determine the risks to this pregnancy. His results showed that he has a 3 copies of the SMN1 gene.  Though this cannot eliminate the chance for him to be a carrier, it reduces the chance (given his African American ancestry) to 1 in 4,200.  Based upon these results, there is a ([id number]) 1 in 8,400 chance for any child of this couple to have SMA.  We did review that prenatal diagnosis through amniocentesis is available if they need more definitive results and that SMA is included in [place]'s newborn screening  program.  It is recommended that Ms. Montecalvo inform her daughter as well as other family members about her SMA carrier status, as her daughter has a 50% chance of being a carrier for SMA. If other family members are concerned, we would recommend genetic counseling and carrier screening for SMA either during the preconception period or during pregnancy.  We also reviewed the results of the carrier screening for Cystic fibrosis, hemoglobinopathies and Fragile X syndrome.  All of these tests were within normal limits and therefore no additional testing was recommended. MaterniT21 screening for chromosome conditions was also performed and was low risk for Trisomy 95, 18 and 21 as well as sex chromosome conditions.  Pregnancy history:  We obtained a detailed family history and pregnancy history.  Ms. Myint reported that this is her third pregnancy, the second with her current partner.  She has a healthy 30 year old daughter from a prior relationship.  The couple had a miscarriage in late 2022.  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  We reviewed briefly that chromosome abnormalities, particularly trisomy 53, are common in first trimester loss.  Given the patient's age of 36 years old the recurrence for a chromosome condition and the pregnancy based on this history was estimated to be 1%.  She asked not to speak about this result in detail, which we honored, but did reassure that the recurrence risk was likely low.  Of note, the MaterniT21 results do not assess for Trisomy 16. In the current pregnancy the patient reported no  exposure to tobacco, alcohol, recreational drugs or prescription medications other than progesterone, vitamins and iron infusions.  The patient indicated she had issues with low iron since her gastric bypass was performed.  Family history: In the family history, the patient reported a full sister who has 4 children: 2 sons with autism, 1 son with ADHD and a  healthy daughter.  She also reported a maternal half uncle with autism.  We reviewed that there may be many causes for autism spectrum disorders including both inherited and environmental factors.  Prior carrier screening on Ms. Stambaugh showed normal results for Fragile X with repeat numbers of 29 and 32. Fragile X is the most common inherited cause for intellectual disability and may have autism as a feature. Though this does not address all causes for autism, we would not expect this pregnancy to be at risk for Fragile X syndrome.  If any other information is learned about the cause for the autism and these family members, we are happy to discuss this further.  The patient also reported a paternal first cousin who passed away at 1 year of life with developmental delays and multiple birth defects.  He was said to have looked a distinctive but she is unaware of the specific diagnosis for his condition. This child's mother (her aunt) also had 5 other healthy children and several miscarriages.  We discussed that recurrent loss, birth defects and developmental delays could be the result of a chromosome translocation and therefore karyotype could be considered.  The father of the baby, Clifton Custard, is 62 years old and reported a stillborn baby in a prior relationship.  He was not aware of any birth defects or structural anomalies in that baby and only knew that the baby was born very prematurely.  Without additional information a recurrence risk assessment is difficult.  Both the patient and her partner reported a significant family history of hypertension and high cholesterol which we reviewed may be related to genetic as well as lifestyle factors.  The remainder of the family history was unremarkable for birth differences, intellectual disabilities, recurrent pregnancy loss or known genetic conditions.  Plan of care: The couple declined prenatal diagnosis for SMA given Aaron's normal carrier screening and the resulting 1  in 8,400 chance for SMA in this pregnancy. Return for detailed anatomy ultrasound on 07/04/21 at [redacted] weeks gestation. We can review the history of trisomy 16 further at that visit if desired, though the patient was adamant that she did not want to talk about it further. If any additional information is learned about the autism diagnoses, cousin who passed away at 5 year old, or the losses in her paternal aunt, we can discuss additional testing including karyotype for translocations.  We appreciate being involved in the care of this couple. We may be reached at [phone].  Cherly Anderson, MS, CGC

## 2021-06-18 NOTE — Patient Instructions (Signed)

## 2021-06-20 ENCOUNTER — Other Ambulatory Visit: Payer: Self-pay

## 2021-06-20 ENCOUNTER — Inpatient Hospital Stay: Payer: Medicaid Other

## 2021-06-20 ENCOUNTER — Other Ambulatory Visit: Payer: Self-pay | Admitting: Obstetrics and Gynecology

## 2021-06-20 VITALS — BP 119/75 | HR 79 | Temp 97.0°F | Resp 18

## 2021-06-20 DIAGNOSIS — D508 Other iron deficiency anemias: Secondary | ICD-10-CM | POA: Diagnosis not present

## 2021-06-20 DIAGNOSIS — N96 Recurrent pregnancy loss: Secondary | ICD-10-CM

## 2021-06-20 MED ORDER — SODIUM CHLORIDE 0.9 % IV SOLN: 200.0000 mg | INTRAVENOUS | Status: DC

## 2021-06-20 MED ORDER — IRON SUCROSE 20 MG/ML IV SOLN
200.0000 mg | Freq: Once | INTRAVENOUS | Status: AC
  Administered 2021-06-20: 200 mg via INTRAVENOUS
  Filled 2021-06-20: qty 10

## 2021-06-20 MED ORDER — SODIUM CHLORIDE 0.9 % IV SOLN
Freq: Once | INTRAVENOUS | Status: AC
  Filled 2021-06-20: qty 250

## 2021-06-20 NOTE — Patient Instructions (Signed)

## 2021-07-04 ENCOUNTER — Ambulatory Visit: Payer: Medicaid Other | Attending: Obstetrics

## 2021-07-04 ENCOUNTER — Other Ambulatory Visit: Payer: Self-pay

## 2021-07-04 DIAGNOSIS — E669 Obesity, unspecified: Secondary | ICD-10-CM | POA: Diagnosis not present

## 2021-07-04 DIAGNOSIS — O09292 Supervision of pregnancy with other poor reproductive or obstetric history, second trimester: Secondary | ICD-10-CM | POA: Diagnosis not present

## 2021-07-04 DIAGNOSIS — O09522 Supervision of elderly multigravida, second trimester: Secondary | ICD-10-CM

## 2021-07-04 DIAGNOSIS — O0990 Supervision of high risk pregnancy, unspecified, unspecified trimester: Secondary | ICD-10-CM

## 2021-07-04 DIAGNOSIS — O0992 Supervision of high risk pregnancy, unspecified, second trimester: Secondary | ICD-10-CM | POA: Insufficient documentation

## 2021-07-04 DIAGNOSIS — Z3A18 18 weeks gestation of pregnancy: Secondary | ICD-10-CM | POA: Diagnosis not present

## 2021-07-04 DIAGNOSIS — O99212 Obesity complicating pregnancy, second trimester: Secondary | ICD-10-CM | POA: Insufficient documentation

## 2021-07-04 DIAGNOSIS — O09293 Supervision of pregnancy with other poor reproductive or obstetric history, third trimester: Secondary | ICD-10-CM | POA: Insufficient documentation

## 2021-07-11 ENCOUNTER — Ambulatory Visit: Payer: Medicaid Other | Admitting: Family

## 2021-07-11 ENCOUNTER — Other Ambulatory Visit: Payer: Self-pay | Admitting: *Deleted

## 2021-07-11 DIAGNOSIS — D509 Iron deficiency anemia, unspecified: Secondary | ICD-10-CM

## 2021-07-18 ENCOUNTER — Other Ambulatory Visit: Payer: Self-pay

## 2021-07-18 ENCOUNTER — Encounter: Payer: Self-pay | Admitting: Nurse Practitioner

## 2021-07-18 ENCOUNTER — Ambulatory Visit (INDEPENDENT_AMBULATORY_CARE_PROVIDER_SITE_OTHER): Payer: Medicaid Other | Admitting: Obstetrics and Gynecology

## 2021-07-18 ENCOUNTER — Encounter: Payer: Self-pay | Admitting: Obstetrics and Gynecology

## 2021-07-18 VITALS — BP 100/70 | Wt 199.0 lb

## 2021-07-18 DIAGNOSIS — O0990 Supervision of high risk pregnancy, unspecified, unspecified trimester: Secondary | ICD-10-CM

## 2021-07-18 LAB — POCT URINALYSIS DIPSTICK OB
Glucose, UA: NEGATIVE
POC,PROTEIN,UA: NEGATIVE

## 2021-07-18 NOTE — Progress Notes (Signed)
? ? ?  Routine Prenatal Care Visit ? ?Subjective  ?Haley Kramer is a 36 y.o. G3P1011 at [redacted]w[redacted]d being seen today for ongoing prenatal care.  She is currently monitored for the following issues for this high-risk pregnancy and has Benign hypertension; Missed abortion; Supervision of high risk pregnancy, antepartum; Depression; Iron deficiency anemia; and Vitamin D deficiency on their problem list.  ?----------------------------------------------------------------------------------- ?Patient reports no complaints.   ?Contractions: Not present. Vag. Bleeding: None.  Movement: Present. Denies leaking of fluid.  ?----------------------------------------------------------------------------------- ?The following portions of the patient's history were reviewed and updated as appropriate: allergies, current medications, past family history, past medical history, past social history, past surgical history and problem list. Problem list updated. ? ? ?Objective  ?Blood pressure 100/70, weight 199 lb (90.3 kg), last menstrual period 02/25/2021, unknown if currently breastfeeding. ?Pregravid weight 205 lb (93 kg) Total Weight Gain -6 lb (-2.722 kg) ?Urinalysis:     ? ?Fetal Status: Fetal Heart Rate (bpm): 155   Movement: Present    ? ?General:  Alert, oriented and cooperative. Patient is in no acute distress.  ?Skin: Skin is warm and dry. No rash noted.   ?Cardiovascular: Normal heart rate noted  ?Respiratory: Normal respiratory effort, no problems with respiration noted  ?Abdomen: Soft, gravid, appropriate for gestational age. Pain/Pressure: Absent     ?Pelvic:  Cervical exam deferred        ?Extremities: Normal range of motion.  Edema: None  ?Mental Status: Normal mood and affect. Normal behavior. Normal judgment and thought content.  ? ? ? ?Assessment  ? ?36 y.o. G3P1011 at [redacted]w[redacted]d by  12/02/2021, by Last Menstrual Period presenting for routine prenatal visit ? ?Plan  ? ?pregnancy 3 Problems (from 04/23/21 to present)   ? ?  Problem Noted Resolved  ? Supervision of high risk pregnancy, antepartum 04/23/2021 by Allen Derry, CNM No  ? Overview Addendum 07/18/2021  1:35 PM by Homero Fellers, MD  ?   ? ? ?Nursing Staff Provider  ?Office Location  [facility] Dating  LMP = 7 wk [place]  ?Language  English  Anatomy US  complete  ?Flu Vaccine  04/23/21 Genetic Screen  NIPS: normal XY  ?TDaP vaccine   Hgb A1C or  ?GTT Early : 4.9 ?Third trimester :   ?Covid    LAB RESULTS   ?Rhogam   Blood Type O NEG  ?Feeding Plan  Antibody NEG  ?Contraception  Rubella 6.16 (12/30 1154)  ?Circumcision  RPR Non Reactive (12/30 1154)   ?Pediatrician   HBsAg Negative (12/30 1154)   ?Support Person Marjory Lies HIV Non Reactive (12/30 1154)  ?Prenatal Classes   Varicella  Immune  ?  GBS    ?BTL Consent     ?VBAC Consent  Pap  2022 NIL HPV negative  ?  Hgb Electro   normal  ?Pelvis Tested  CF  negative  ?   SMA carrier  ?     ? ?Obesity ?HX LTCS  ?AMA ?History of gastric bypass ?Vitamin D deficiency ?Iron deficiency anemia ?  ?  ? ?  ?  ? ?Gestational age appropriate obstetric precautions including but not limited to vaginal bleeding, contractions, leaking of fluid and fetal movement were reviewed in detail with the patient.   ? ?Return in about 4 weeks (around 08/15/2021) for ROB in person. ? ?Homero Fellers MD ?[place], [place] ?07/18/2021, 1:35 PM ? ? ?

## 2021-07-18 NOTE — Patient Instructions (Addendum)
[facility] ?[phone] ? ? ?Second Trimester of Pregnancy ?The second trimester of pregnancy is from week 13 through week 27. This is months 4 through 6 of pregnancy. The second trimester is often a time when you feel your best. Your body has adjusted to being pregnant, and you begin to feel better physically. ?During the second trimester: ?Morning sickness has lessened or stopped completely. ?You may have more energy. ?You may have an increase in appetite. ?The second trimester is also a time when the unborn baby (fetus) is growing rapidly. At the end of the sixth month, the fetus may be up to 12 inches long and weigh about 1? pounds. You will likely begin to feel the baby move (quickening) between 16 and 20 weeks of pregnancy. ?Body changes during your second trimester ?Your body continues to go through many changes during your second trimester. The changes vary and generally return to normal after the baby is born. ?Physical changes ?Your weight will continue to increase. You will notice your lower abdomen bulging out. ?You may begin to get stretch marks on your hips, abdomen, and breasts. ?Your breasts will continue to grow and to become tender. ?Dark spots or blotches (chloasma or mask of pregnancy) may develop on your face. ?A dark line from your belly button to the pubic area (linea nigra) may appear. ?You may have changes in your hair. These can include thickening of your hair, rapid growth, and changes in texture. Some people also have hair loss during or after pregnancy, or hair that feels dry or thin. ?Health changes ?You may develop headaches. ?You may have heartburn. ?You may develop constipation. ?You may develop hemorrhoids or swollen, bulging veins (varicose veins). ?Your gums may bleed and may be sensitive to brushing and flossing. ?You may urinate more often because the fetus is pressing on your bladder. ?You may have back pain. This is caused by: ?Weight gain. ?Pregnancy  hormones that are relaxing the joints in your pelvis. ?A shift in weight and the muscles that support your balance. ?Follow these instructions at home: ?Medicines ?Follow your health care provider's instructions regarding medicine use. Specific medicines may be either safe or unsafe to take during pregnancy. Do not take any medicines unless approved by your health care provider. ?Take a prenatal vitamin that contains at least 600 micrograms (mcg) of folic acid. ?Eating and drinking ?Eat a healthy diet that includes fresh fruits and vegetables, whole grains, good sources of protein such as meat, eggs, or tofu, and low-fat dairy products. ?Avoid raw meat and unpasteurized juice, milk, and cheese. These carry germs that can harm you and your baby. ?You may need to take these actions to prevent or treat constipation: ?Drink enough fluid to keep your urine pale yellow. ?Eat foods that are high in fiber, such as beans, whole grains, and fresh fruits and vegetables. ?Limit foods that are high in fat and processed sugars, such as fried or sweet foods. ?Activity ?Exercise only as directed by your health care provider. Most people can continue their usual exercise routine during pregnancy. Try to exercise for 30 minutes at least 5 days a week. Stop exercising if you develop contractions in your uterus. ?Stop exercising if you develop pain or cramping in the lower abdomen or lower back. ?Avoid exercising if it is very hot or humid or if you are at a high altitude. ?Avoid heavy lifting. ?If you choose to, you may have sex unless your health care provider tells you not to. ?  Relieving pain and discomfort ?Wear a supportive bra to prevent discomfort from breast tenderness. ?Take warm sitz baths to soothe any pain or discomfort caused by hemorrhoids. Use hemorrhoid cream if your health care provider approves. ?Rest with your legs raised (elevated) if you have leg cramps or low back pain. ?If you develop varicose veins: ?Wear  support hose as told by your health care provider. ?Elevate your feet for 15 minutes, 3-4 times a day. ?Limit salt in your diet. ?Safety ?Wear your seat belt at all times when driving or riding in a car. ?Talk with your health care provider if someone is verbally or physically abusive to you. ?Lifestyle ?Do not use hot tubs, steam rooms, or saunas. ?Do not douche. Do not use tampons or scented sanitary pads. ?Avoid cat litter boxes and soil used by cats. These carry germs that can cause birth defects in the baby and possibly loss of the fetus by miscarriage or stillbirth. ?Do not use herbal remedies, alcohol, illegal drugs, or medicines that are not approved by your health care provider. Chemicals in these products can harm your baby. ?Do not use any products that contain nicotine or tobacco, such as cigarettes, e-cigarettes, and chewing tobacco. If you need help quitting, ask your health care provider. ?General instructions ?During a routine prenatal visit, your health care provider will do a physical exam and other tests. He or she will also discuss your overall health. Keep all follow-up visits. This is important. ?Ask your health care provider for a referral to a local prenatal education class. ?Ask for help if you have counseling or nutritional needs during pregnancy. Your health care provider can offer advice or refer you to specialists for help with various needs. ?Where to find more information ?American Pregnancy Association: americanpregnancy.org ?[employer]: https://www.todd-brady.net/ ?Office on Women's Health: MightyReward.co.nz ?Contact a health care provider if you have: ?A headache that does not go away when you take medicine. ?Vision changes or you see spots in front of your eyes. ?Mild pelvic cramps, pelvic pressure, or nagging pain in the abdominal area. ?Persistent nausea, vomiting, or diarrhea. ?A bad-smelling vaginal discharge or  foul-smelling urine. ?Pain when you urinate. ?Sudden or extreme swelling of your face, hands, ankles, feet, or legs. ?A fever. ?Get help right away if you: ?Have fluid leaking from your vagina. ?Have spotting or bleeding from your vagina. ?Have severe abdominal cramping or pain. ?Have difficulty breathing. ?Have chest pain. ?Have fainting spells. ?Have not felt your baby move for the time period told by your health care provider. ?Have new or increased pain, swelling, or redness in an arm or leg. ?Summary ?The second trimester of pregnancy is from week 13 through week 27 (months 4 through 6). ?Do not use herbal remedies, alcohol, illegal drugs, or medicines that are not approved by your health care provider. Chemicals in these products can harm your baby. ?Exercise only as directed by your health care provider. Most people can continue their usual exercise routine during pregnancy. ?Keep all follow-up visits. This is important. ?This information is not intended to replace advice given to you by your health care provider. Make sure you discuss any questions you have with your health care provider. ?Document Revised: 10/05/2019 Document Reviewed: 08/11/2019 ?Elsevier Patient Education ? 2022 [employer] ? ?

## 2021-07-18 NOTE — Addendum Note (Signed)
Addended by: Cornelius Moras D on: 07/18/2021 01:43 PM ? ? Modules accepted: Orders ? ?

## 2021-07-19 ENCOUNTER — Inpatient Hospital Stay: Payer: Medicaid Other | Attending: Oncology

## 2021-07-19 ENCOUNTER — Other Ambulatory Visit: Payer: Self-pay

## 2021-07-19 ENCOUNTER — Inpatient Hospital Stay (HOSPITAL_BASED_OUTPATIENT_CLINIC_OR_DEPARTMENT_OTHER): Payer: Medicaid Other | Admitting: Nurse Practitioner

## 2021-07-19 DIAGNOSIS — D509 Iron deficiency anemia, unspecified: Secondary | ICD-10-CM

## 2021-07-19 DIAGNOSIS — O99012 Anemia complicating pregnancy, second trimester: Secondary | ICD-10-CM | POA: Diagnosis present

## 2021-07-19 DIAGNOSIS — D508 Other iron deficiency anemias: Secondary | ICD-10-CM | POA: Diagnosis present

## 2021-07-19 DIAGNOSIS — Z9884 Bariatric surgery status: Secondary | ICD-10-CM | POA: Insufficient documentation

## 2021-07-19 LAB — CBC
HCT: 35.9 % — ABNORMAL LOW (ref 36.0–46.0)
Hemoglobin: 11.7 g/dL — ABNORMAL LOW (ref 12.0–15.0)
MCH: 29.6 pg (ref 26.0–34.0)
MCHC: 32.6 g/dL (ref 30.0–36.0)
MCV: 90.9 fL (ref 80.0–100.0)
Platelets: 221 10*3/uL (ref 150–400)
RBC: 3.95 MIL/uL (ref 3.87–5.11)
RDW: 16.2 % — ABNORMAL HIGH (ref 11.5–15.5)
WBC: 12.6 10*3/uL — ABNORMAL HIGH (ref 4.0–10.5)
nRBC: 0 % (ref 0.0–0.2)

## 2021-07-19 LAB — FERRITIN: Ferritin: 50 ng/mL (ref 11–307)

## 2021-07-19 LAB — IRON AND TIBC
Iron: 125 ug/dL (ref 28–170)
Saturation Ratios: 33 % — ABNORMAL HIGH (ref 10.4–31.8)
TIBC: 384 ug/dL (ref 250–450)
UIBC: 259 ug/dL

## 2021-07-19 NOTE — Progress Notes (Signed)
Hematology/Oncology Consult note [facility] [phone] [phone]  Virtual Visit Progress Note  I connected with Haley Kramer on 07/19/21 at  2:30 PM EST by video enabled telemedicine visit and verified that I am speaking with the correct person using two identifiers.   I discussed the limitations, risks, security and privacy concerns of performing an evaluation and management service by telemedicine and the availability of in-person appointments. I also discussed with the patient that there may be a patient responsible charge related to this service. The patient expressed understanding and agreed to proceed.   Other persons participating in the visit and their role in the encounter: none   Patients location: home  Providers location: clinic   Patient Care Team: [place], Julie A, MD as PCP - General (Family Medicine) Sindy Guadeloupe, MD as Consulting Physician (Hematology and Oncology) Homero Fellers, MD as Consulting Physician (Obstetrics and Gynecology)   Name of the patient: Haley Kramer  IN:[id number]  10-Jun-1985   Reason for referral-iron deficiency anemia in pregnancy   Referring physician-Dr. Gilman Schmidt  Date of visit: 07/19/21  History of presenting illness- Presented from Dr. Gilman Schmidt for anemia of pregnancy. She is G3, P1.  She has a prior history of gastric bypass.  She has been on oral iron for about 8 months now which she takes every other day.Her most recent CBC from 05/10/2021 showed white cell count of 11, H&H of 11.2/35.1 and a platelet count of 303.  Ferritin levels were low at 13.  Iron and TIBC showed elevated TIBC of 482 with an iron saturation of 10%.  B12 levels were normal at 491.  Folate levels were normal.  Patient currently reports feeling fatigued but denies other complaints at this time.  She does report feeling constipated with daily iron.   Interval History: patient is a 36 year old female, currently [redacted]  weeks pregnant with PMH of gastric bypass, on oral iron, now s/p venofer x 5 who returns to clinic for follow up.   ECOG PS- 0  Review of systems- Review of Systems  Constitutional:  Positive for malaise/fatigue. Negative for chills, fever and weight loss.  HENT:  Negative for congestion, ear discharge and nosebleeds.   Eyes:  Negative for blurred vision.  Respiratory:  Negative for cough, hemoptysis, sputum production, shortness of breath and wheezing.   Cardiovascular:  Negative for chest pain, palpitations, orthopnea and claudication.  Gastrointestinal:  Negative for abdominal pain, blood in stool, constipation, diarrhea, heartburn, melena, nausea and vomiting.  Genitourinary:  Negative for dysuria, flank pain, frequency, hematuria and urgency.  Musculoskeletal:  Negative for back pain, joint pain and myalgias.  Skin:  Negative for rash.  Neurological:  Negative for dizziness, tingling, focal weakness, seizures, weakness and headaches.  Endo/Heme/Allergies:  Does not bruise/bleed easily.  Psychiatric/Behavioral:  Negative for depression and suicidal ideas. The patient does not have insomnia.    Allergies  Allergen Reactions   Pineapple Hives    Patient Active Problem List   Diagnosis Date Noted   Iron deficiency anemia 05/15/2021   Vitamin D deficiency 05/15/2021   Supervision of high risk pregnancy, antepartum 04/23/2021   Depression 04/23/2021   Missed abortion    Benign hypertension 06/13/2020    Past Medical History:  Diagnosis Date   IDA (iron deficiency anemia)    Incomplete abortion 01/14/2021   Iron deficiency anemia, unspecified iron deficiency anemia type    Obesity    PONV (postoperative nausea and vomiting)    Vitamin  D deficiency     Past Surgical History:  Procedure Laterality Date   CESAREAN SECTION  03/23/2011   DILATION AND EVACUATION N/A 01/22/2021   Procedure: DILATATION AND EVACUATION;  Surgeon: Homero Fellers, MD;  Location: [facility] ORS;   Service: Gynecology;  Laterality: N/A;   ESOPHAGOGASTRODUODENOSCOPY  2016   GASTRIC BYPASS  2016    Social History   Socioeconomic History   Marital status: Single    Spouse name: aaron   Number of children: 1   Years of education: Not on file   Highest education level: Not on file  Occupational History   Not on file  Tobacco Use   Smoking status: Never   Smokeless tobacco: Never  Vaping Use   Vaping Use: Never used  Substance and Sexual Activity   Alcohol use: No   Drug use: Never   Sexual activity: Yes    Partners: Male    Birth control/protection: None    Comment: hx mirena x 10 yr/removed 05/11/2020  Other Topics Concern   Not on file  Social History Narrative   Lives with mother and child   Social Determinants of Health   Financial Resource Strain: Not on file  Food Insecurity: Not on file  Transportation Needs: Not on file  Physical Activity: Not on file  Stress: Not on file  Social Connections: Not on file  Intimate Partner Violence: Not At Risk   Fear of Current or Ex-Partner: No   Emotionally Abused: No   Physically Abused: No   Sexually Abused: No     Family History  Problem Relation Age of Onset   Hypertension Mother    Diabetes Mother    Hypertension Father    Diabetes Father    Bipolar disorder Sister    Schizophrenia Sister    Narcolepsy Sister    Healthy Sister    Hypertension Maternal Grandmother    Diabetes Maternal Grandmother    Hypertension Maternal Grandfather    Diabetes Maternal Grandfather    Hypertension Paternal Grandmother    Hypertension Paternal Grandfather      Current Outpatient Medications:    acetaminophen (TYLENOL) 500 MG tablet, Take 500 mg by mouth every 8 (eight) hours as needed for moderate pain., Disp: , Rfl:    Cholecalciferol (HM VITAMIN D3) 100 MCG (4000 UT) CAPS, Take 1 capsule (4,000 Units total) by mouth daily., Disp: 90 capsule, Rfl: 0   doxylamine, Sleep, (UNISOM) 25 MG tablet, Take 50 mg by mouth at  bedtime as needed., Disp: , Rfl:    Doxylamine-Pyridoxine (DICLEGIS) 10-10 MG TBEC, Take 2 tablets by mouth at bedtime. If symptoms persist, add one tablet in the morning and one in the afternoon, Disp: 100 tablet, Rfl: 5   folic acid (FOLVITE) Q000111Q MCG tablet, Take 400 mcg by mouth daily., Disp: , Rfl:    IRON-B12-VIT C-FA-IFC PO, Take 1 tablet by mouth daily., Disp: , Rfl:    ondansetron (ZOFRAN-ODT) 4 MG disintegrating tablet, Take 1 tablet (4 mg total) by mouth every 6 (six) hours as needed for nausea., Disp: 120 tablet, Rfl: 3   Prenatal Multivit-Min-Fe-FA (PRE-NATAL PO), Take by mouth., Disp: , Rfl:    progesterone (PROMETRIUM) 200 MG capsule, PLACE 1 CAPSULE VAGINALLY DAILY., Disp: 30 capsule, Rfl: 0   metroNIDAZOLE (METROGEL) 0.75 % vaginal gel, Place 1 Applicatorful vaginally at bedtime. Apply one applicatorful to vagina at bedtime for 5 days (Patient not taking: Reported on 07/18/2021), Disp: 70 g, Rfl: 0   progesterone (PROMETRIUM) 200  MG capsule, Place 1 capsule (200 mg total) vaginally daily., Disp: 30 capsule, Rfl: 0   Physical exam:  There were no vitals filed for this visit.  Physical Exam Constitutional:      General: She is not in acute distress. HENT:     Head: Normocephalic.  Pulmonary:     Effort: No respiratory distress.  Neurological:     Mental Status: She is alert and oriented to person, place, and time.  Psychiatric:        Mood and Affect: Mood normal.        Behavior: Behavior normal.    CBC Latest Ref Rng & Units 07/19/2021 05/10/2021 04/24/2021  WBC 4.0 - 10.5 K/uL 12.6(H) 11.0(H) 9.2  Hemoglobin 12.0 - 15.0 g/dL 11.7(L) 11.2 10.9(L)  Hematocrit 36.0 - 46.0 % 35.9(L) 35.1 34.3(L)  Platelets 150 - 400 K/uL 221 303 291   Iron/TIBC/Ferritin/ %Sat    Component Value Date/Time   IRON 125 07/19/2021 0916   IRON 48 05/10/2021 1154   TIBC 384 07/19/2021 0916   TIBC 482 (H) 05/10/2021 1154   FERRITIN 50 07/19/2021 0916   FERRITIN 13 (L) 05/10/2021 1154    IRONPCTSAT 33 (H) 07/19/2021 0916   IRONPCTSAT 10 (L) 05/10/2021 1154    Assessment and plan- Patient is a 36 y.o. female   Iron deficiency anemia in pregnancy- s/p venofer x 5. Tolerated venofer well without significant side effects. Ferritin has improved to 50 (prev 13). Iron sat now 33% (prev 10%). Hemoglobin 11.7. Hold venofer at this time. Plan to recheck in 4 weeks.   Return to clinic  4 weeks- lab (cbc, ferritin, iron studies) Virtual visit day later with Dr. Janese Banks  I discussed the assessment and treatment plan with the patient. The patient was provided an opportunity to ask questions and all were answered. The patient agreed with the plan and demonstrated an understanding of the instructions.   The patient was advised to call back or seek an in-person evaluation if the symptoms worsen or if the condition fails to improve as anticipated.   I spent 15 minutes face-to-face video visit time dedicated to the care of this patient on the date of this encounter to include pre-visit review of lab, chart review face-to-face time with the patient, and post visit ordering of testing/documentation.   Visit Diagnosis 1. Iron deficiency anemia, unspecified iron deficiency anemia type   2. Anemia during pregnancy in second trimester    Beckey Rutter, DNP, AGNP-C [place] [facility] [phone] (clinic) 07/19/2021

## 2021-07-20 ENCOUNTER — Other Ambulatory Visit: Payer: Self-pay | Admitting: Obstetrics and Gynecology

## 2021-07-20 DIAGNOSIS — N96 Recurrent pregnancy loss: Secondary | ICD-10-CM

## 2021-07-22 ENCOUNTER — Other Ambulatory Visit: Payer: Self-pay | Admitting: Obstetrics and Gynecology

## 2021-07-22 ENCOUNTER — Encounter: Payer: Self-pay | Admitting: Obstetrics and Gynecology

## 2021-07-22 DIAGNOSIS — N96 Recurrent pregnancy loss: Secondary | ICD-10-CM

## 2021-07-23 ENCOUNTER — Other Ambulatory Visit: Payer: Self-pay | Admitting: Obstetrics and Gynecology

## 2021-07-23 DIAGNOSIS — K9049 Malabsorption due to intolerance, not elsewhere classified: Secondary | ICD-10-CM

## 2021-07-23 DIAGNOSIS — D509 Iron deficiency anemia, unspecified: Secondary | ICD-10-CM

## 2021-07-23 DIAGNOSIS — E559 Vitamin D deficiency, unspecified: Secondary | ICD-10-CM

## 2021-07-23 NOTE — Progress Notes (Signed)
Vitamin B1, B12, and vitamin D orders placed for next visit ?

## 2021-07-30 ENCOUNTER — Other Ambulatory Visit: Payer: Self-pay

## 2021-07-30 DIAGNOSIS — O0990 Supervision of high risk pregnancy, unspecified, unspecified trimester: Secondary | ICD-10-CM

## 2021-07-30 DIAGNOSIS — Z148 Genetic carrier of other disease: Secondary | ICD-10-CM

## 2021-07-30 DIAGNOSIS — Z8279 Family history of other congenital malformations, deformations and chromosomal abnormalities: Secondary | ICD-10-CM

## 2021-08-01 ENCOUNTER — Other Ambulatory Visit: Payer: Self-pay

## 2021-08-01 ENCOUNTER — Ambulatory Visit: Payer: Medicaid Other | Attending: Obstetrics and Gynecology

## 2021-08-01 DIAGNOSIS — O99212 Obesity complicating pregnancy, second trimester: Secondary | ICD-10-CM | POA: Insufficient documentation

## 2021-08-01 DIAGNOSIS — Z3A22 22 weeks gestation of pregnancy: Secondary | ICD-10-CM | POA: Diagnosis not present

## 2021-08-01 DIAGNOSIS — Z8279 Family history of other congenital malformations, deformations and chromosomal abnormalities: Secondary | ICD-10-CM

## 2021-08-01 DIAGNOSIS — O09522 Supervision of elderly multigravida, second trimester: Secondary | ICD-10-CM | POA: Diagnosis not present

## 2021-08-01 DIAGNOSIS — O0990 Supervision of high risk pregnancy, unspecified, unspecified trimester: Secondary | ICD-10-CM

## 2021-08-01 DIAGNOSIS — Z6791 Unspecified blood type, Rh negative: Secondary | ICD-10-CM | POA: Diagnosis not present

## 2021-08-01 DIAGNOSIS — O360120 Maternal care for anti-D [Rh] antibodies, second trimester, not applicable or unspecified: Secondary | ICD-10-CM | POA: Diagnosis not present

## 2021-08-01 DIAGNOSIS — Z363 Encounter for antenatal screening for malformations: Secondary | ICD-10-CM | POA: Insufficient documentation

## 2021-08-01 DIAGNOSIS — O09292 Supervision of pregnancy with other poor reproductive or obstetric history, second trimester: Secondary | ICD-10-CM

## 2021-08-01 DIAGNOSIS — O0992 Supervision of high risk pregnancy, unspecified, second trimester: Secondary | ICD-10-CM | POA: Insufficient documentation

## 2021-08-01 DIAGNOSIS — E669 Obesity, unspecified: Secondary | ICD-10-CM | POA: Insufficient documentation

## 2021-08-01 DIAGNOSIS — Z148 Genetic carrier of other disease: Secondary | ICD-10-CM | POA: Insufficient documentation

## 2021-08-07 ENCOUNTER — Other Ambulatory Visit: Payer: Self-pay | Admitting: *Deleted

## 2021-08-07 ENCOUNTER — Telehealth (INDEPENDENT_AMBULATORY_CARE_PROVIDER_SITE_OTHER): Payer: Medicaid Other

## 2021-08-07 DIAGNOSIS — O99012 Anemia complicating pregnancy, second trimester: Secondary | ICD-10-CM

## 2021-08-07 DIAGNOSIS — O0990 Supervision of high risk pregnancy, unspecified, unspecified trimester: Secondary | ICD-10-CM

## 2021-08-07 DIAGNOSIS — D509 Iron deficiency anemia, unspecified: Secondary | ICD-10-CM

## 2021-08-07 DIAGNOSIS — O09899 Supervision of other high risk pregnancies, unspecified trimester: Secondary | ICD-10-CM

## 2021-08-07 DIAGNOSIS — Z3A00 Weeks of gestation of pregnancy not specified: Secondary | ICD-10-CM

## 2021-08-07 NOTE — Progress Notes (Signed)
New OB Intake ? ?I connected with  Haley Kramer on 08/07/21 at  8:15 AM EDT by telephone and verified that I am speaking with the correct person using two identifiers. Nurse is located at [facility] and pt is located at home. Patient would prefer telephone visit due to poor internet connection. Approx 30 minutes spent on telephone encounter ? ?I discussed the limitations, risks, security and privacy concerns of performing an evaluation and management service by telephone and the availability of in person appointments. I also discussed with the patient that there may be a patient responsible charge related to this service. The patient expressed understanding and agreed to proceed. ? ?I explained I am completing New OB Intake today. We discussed her EDD of 12/02/21 that is based on LMP of 02/25/21. Pt is G3/P1011. I reviewed her allergies, medications, Medical/Surgical/OB history, and appropriate screenings. I informed her of [facility] services. [facility] information placed in AVS. Based on history, this is pregnancy is complicated by history of gastric bypass surgery and c-section.  ? ?Patient Active Problem List  ? Diagnosis Date Noted  ? Iron deficiency anemia 05/15/2021  ? Vitamin D deficiency 05/15/2021  ? Supervision of high risk pregnancy, antepartum 04/23/2021  ? Depression 04/23/2021  ? Missed abortion   ? Benign hypertension 06/13/2020  ? ?Concerns addressed today ? ?Vitamin D level: Patient states this was initially found to be low. Patient has been taking supplement. Desires this to be rechecked at next appt.  ? ?Iron level: Patient was anemic at start of pregnancy. Has received with iron infusion during this pregnancy. Will return for labs at cancer center in [place]. Desires close follow up for anemia.  ? ?Delivery Plans:  ?Plans to deliver at [facility]. Unsure if she desires VBAC or repeat c-section. C-section was performed due to non reassuring fetal heart rate during induction for obesity at 38  weeks. ? ?MyChart/Babyscripts ?MyChart access verified. I explained pt will have some visits in office and some virtually. Babyscripts instructions given and order placed. ? ?Blood Pressure Cuff  ?Has BP cuff. Explained after first prenatal appt pt will check weekly and document in Babyscripts. ? ?Weight scale: Patient has weight scale. ? ?Anatomy US ?Completed prior to transfer of care. Follow up US with MFM scheduled for 09/26/21. ? ?Labs ?All labs completed by [facility] prior to transfer. Last PAP WNL on 10/29/20. Patient will need third trimester labs.  ? ?COVID Vaccine ?Patient has had COVID vaccine x 2. Cannot recall date/type of booster received. ? ?Is patient a CenteringPregnancy candidate? Not a candidate; late gestational age. ?  ?Is patient a Mom+Baby Combined Care candidate? Not a candidate; single living child.  ? ?Social Determinants of Health ?Food Insecurity: Patient denies food insecurity. ?WIC Referral: Prenatal navigator to discuss. ?Transportation: Patient denies transportation needs. ?Childcare: Discussed no children allowed at ultrasound appointments. Offered childcare services; patient declines childcare services at this time. ? ?First visit review ?I reviewed new OB appt with pt. I explained she will have a provider visit for routine prenatal followo up visit. May need blood work depending on provider recommendation. Explained pt will be seen by Nettie Elm, MD at first visit; encounter routed to appropriate provider. Explained that patient will be seen by pregnancy navigator following visit with provider.  ? ?Marjo Bicker, RN ?08/07/2021  8:23 AM  ?

## 2021-08-09 ENCOUNTER — Telehealth: Payer: Self-pay | Admitting: *Deleted

## 2021-08-09 NOTE — Telephone Encounter (Signed)
Patient called to make an adjustment in her schedule due to her ob apts. RN spoke with patient and changed her apts per her request. Lab moved to 4/4 at 145pm and mychart visit moved to [place] schedule on 4/5 at 230pm per patient's request. Pt thanked me for calling her back. ? ? ?Haley Kramer, patient is feeling a little more fatigue and believes her iron stores may be low. She states that if so, when she has her mychart visit, she would like to discuss restarting iron infusions in the near future. ?

## 2021-08-12 NOTE — BH Specialist Note (Signed)
Pt did not arrive to video visit and did not answer the phone; Left HIPPA-compliant message to call back Danine Hor from [facility] at  [phone] (Cynai Skeens's office).  ?; left MyChart message for patient.  ? ?

## 2021-08-13 ENCOUNTER — Encounter: Payer: Self-pay | Admitting: Obstetrics and Gynecology

## 2021-08-13 ENCOUNTER — Inpatient Hospital Stay: Payer: Medicaid Other | Attending: Oncology

## 2021-08-13 ENCOUNTER — Ambulatory Visit (INDEPENDENT_AMBULATORY_CARE_PROVIDER_SITE_OTHER): Payer: Medicaid Other | Admitting: Obstetrics and Gynecology

## 2021-08-13 VITALS — BP 126/72 | HR 89 | Wt 201.0 lb

## 2021-08-13 DIAGNOSIS — Z98891 History of uterine scar from previous surgery: Secondary | ICD-10-CM

## 2021-08-13 DIAGNOSIS — O09529 Supervision of elderly multigravida, unspecified trimester: Secondary | ICD-10-CM | POA: Insufficient documentation

## 2021-08-13 DIAGNOSIS — O26892 Other specified pregnancy related conditions, second trimester: Secondary | ICD-10-CM

## 2021-08-13 DIAGNOSIS — O99012 Anemia complicating pregnancy, second trimester: Secondary | ICD-10-CM | POA: Diagnosis present

## 2021-08-13 DIAGNOSIS — Z9884 Bariatric surgery status: Secondary | ICD-10-CM | POA: Diagnosis not present

## 2021-08-13 DIAGNOSIS — D508 Other iron deficiency anemias: Secondary | ICD-10-CM | POA: Insufficient documentation

## 2021-08-13 DIAGNOSIS — O09522 Supervision of elderly multigravida, second trimester: Secondary | ICD-10-CM

## 2021-08-13 DIAGNOSIS — O0990 Supervision of high risk pregnancy, unspecified, unspecified trimester: Secondary | ICD-10-CM

## 2021-08-13 DIAGNOSIS — Z6791 Unspecified blood type, Rh negative: Secondary | ICD-10-CM

## 2021-08-13 DIAGNOSIS — E559 Vitamin D deficiency, unspecified: Secondary | ICD-10-CM

## 2021-08-13 DIAGNOSIS — D509 Iron deficiency anemia, unspecified: Secondary | ICD-10-CM

## 2021-08-13 LAB — CBC WITH DIFFERENTIAL/PLATELET
Abs Immature Granulocytes: 0.22 10*3/uL — ABNORMAL HIGH (ref 0.00–0.07)
Basophils Absolute: 0 10*3/uL (ref 0.0–0.1)
Basophils Relative: 0 %
Eosinophils Absolute: 0.2 10*3/uL (ref 0.0–0.5)
Eosinophils Relative: 1 %
HCT: 35.3 % — ABNORMAL LOW (ref 36.0–46.0)
Hemoglobin: 11.4 g/dL — ABNORMAL LOW (ref 12.0–15.0)
Immature Granulocytes: 2 %
Lymphocytes Relative: 18 %
Lymphs Abs: 2.3 10*3/uL (ref 0.7–4.0)
MCH: 30 pg (ref 26.0–34.0)
MCHC: 32.3 g/dL (ref 30.0–36.0)
MCV: 92.9 fL (ref 80.0–100.0)
Monocytes Absolute: 0.6 10*3/uL (ref 0.1–1.0)
Monocytes Relative: 4 %
Neutro Abs: 9.4 10*3/uL — ABNORMAL HIGH (ref 1.7–7.7)
Neutrophils Relative %: 75 %
Platelets: 197 10*3/uL (ref 150–400)
RBC: 3.8 MIL/uL — ABNORMAL LOW (ref 3.87–5.11)
RDW: 13.6 % (ref 11.5–15.5)
WBC: 12.7 10*3/uL — ABNORMAL HIGH (ref 4.0–10.5)
nRBC: 0 % (ref 0.0–0.2)

## 2021-08-13 LAB — IRON AND TIBC
Iron: 108 ug/dL (ref 28–170)
Saturation Ratios: 27 % (ref 10.4–31.8)
TIBC: 398 ug/dL (ref 250–450)
UIBC: 290 ug/dL

## 2021-08-13 LAB — FERRITIN: Ferritin: 23 ng/mL (ref 11–307)

## 2021-08-13 NOTE — Progress Notes (Signed)
Subjective:  ?Haley Kramer is a 36 y.o. G3P1011 at [redacted]w[redacted]d being seen today for ongoing prenatal care.  She is currently monitored for the following issues for this high-risk pregnancy and has Benign hypertension; Supervision of high risk pregnancy, antepartum; Depression; Iron deficiency anemia; Vitamin D deficiency; AMA (advanced maternal age) multigravida 73+; History of C-section; and Rh negative status during pregnancy on their problem list. ? ?Patient reports general discomforts of pregnancy.  Contractions: Not present. Vag. Bleeding: None.  Movement: Present. Denies leaking of fluid.  ? ?The following portions of the patient's history were reviewed and updated as appropriate: allergies, current medications, past family history, past medical history, past social history, past surgical history and problem list. Problem list updated. ? ?Objective:  ? ?Vitals:  ? 08/13/21 1512  ?BP: 126/72  ?Pulse: 89  ?Weight: 201 lb (91.2 kg)  ? ? ?Fetal Status: Fetal Heart Rate (bpm): 148   Movement: Present    ? ?General:  Alert, oriented and cooperative. Patient is in no acute distress.  ?Skin: Skin is warm and dry. No rash noted.   ?Cardiovascular: Normal heart rate noted  ?Respiratory: Normal respiratory effort, no problems with respiration noted  ?Abdomen: Soft, gravid, appropriate for gestational age. Pain/Pressure: Absent     ?Pelvic:  Cervical exam deferred        ?Extremities: Normal range of motion.  Edema: None  ?Mental Status: Normal mood and affect. Normal behavior. Normal judgment and thought content.  ? ?Urinalysis:     ? ?Assessment and Plan:  ?Pregnancy: G3P1011 at [redacted]w[redacted]d ? ?1. Supervision of high risk pregnancy, antepartum ?Stable ?Glucola next visit ? ?2. Other iron deficiency anemia ?Followed by Heme/Onc ? ?3. Vitamin D deficiency ? ?- Vitamin D (25 hydroxy) ?- Vitamin B1 ?- B12 ? ?4. Multigravida of advanced maternal age in second trimester ?Stable ? ?5. History of C-section ?Due to NRFHT undecided at  present of TOLAC vs Repeat c section ? ?6. Rh negative status during pregnancy in second trimester ?Rhogam as indicated ? ?Preterm labor symptoms and general obstetric precautions including but not limited to vaginal bleeding, contractions, leaking of fluid and fetal movement were reviewed in detail with the patient. ?Please refer to After Visit Summary for other counseling recommendations.  ?Return in about 4 weeks (around 09/10/2021) for OB visit, face to face, any provider, fasting for Glucola. ? ? ?Chancy Milroy, MD ?

## 2021-08-13 NOTE — Patient Instructions (Signed)

## 2021-08-14 ENCOUNTER — Encounter: Payer: Self-pay | Admitting: Medical Oncology

## 2021-08-14 ENCOUNTER — Inpatient Hospital Stay (HOSPITAL_BASED_OUTPATIENT_CLINIC_OR_DEPARTMENT_OTHER): Payer: Medicaid Other | Admitting: Medical Oncology

## 2021-08-14 ENCOUNTER — Other Ambulatory Visit: Payer: Self-pay | Admitting: Lactation Services

## 2021-08-14 ENCOUNTER — Encounter: Payer: Self-pay | Admitting: Obstetrics and Gynecology

## 2021-08-14 ENCOUNTER — Other Ambulatory Visit: Payer: Self-pay

## 2021-08-14 DIAGNOSIS — D508 Other iron deficiency anemias: Secondary | ICD-10-CM | POA: Diagnosis not present

## 2021-08-14 DIAGNOSIS — D509 Iron deficiency anemia, unspecified: Secondary | ICD-10-CM | POA: Diagnosis not present

## 2021-08-14 DIAGNOSIS — O99012 Anemia complicating pregnancy, second trimester: Secondary | ICD-10-CM | POA: Diagnosis not present

## 2021-08-14 MED ORDER — FAMOTIDINE 20 MG PO TABS: 20.0000 mg | ORAL_TABLET | Freq: Two times a day (BID) | ORAL | 2 refills | Status: DC

## 2021-08-14 MED ORDER — HM VITAMIN D3 100 MCG (4000 UT) PO CAPS: 1.0000 | ORAL_CAPSULE | Freq: Every day | ORAL | 2 refills | Status: DC

## 2021-08-14 NOTE — Progress Notes (Signed)
Reordered Vitamin D at patients request. Pepcid sent to pharmacy per standing protocol due to persistent heartburn with pregnancy.  ?

## 2021-08-14 NOTE — Progress Notes (Signed)
? ?Hematology/Oncology Consult note ?[facility] ?[phone] [phone] ? ?Virtual Visit Progress Note ? ?I connected with Haley Kramer on 07/19/21 at  2:30 PM EST by telephone enabled telemedicine visit and verified that I am speaking with the correct person using two identifiers.  ? ?I discussed the limitations, risks, security and privacy concerns of performing an evaluation and management service by telemedicine and the availability of in-person appointments. I also discussed with the patient that there may be a patient responsible charge related to this service. The patient expressed understanding and agreed to proceed.  ? ?Other persons participating in the visit and their role in the encounter: none  ? ?Patient?s location: home  ?Provider?s location: clinic  ? ?Patient Care Team: ?[place], Julie A, MD as PCP - General (Family Medicine) ?Creig Hines, MD as Consulting Physician (Hematology and Oncology) ?Natale Milch, MD as Consulting Physician (Obstetrics and Gynecology)  ? ?Name of the patient: Haley Kramer  ?[id number]  ?04/21/1986  ? ?Reason for referral-iron deficiency anemia in pregnancy ?  ?Referring physician-Dr. Jerene Pitch ? ?Date of visit: 08/14/21 ? ?History of presenting illness- Presented from Dr. Jerene Pitch for anemia of pregnancy. She is G3, P1.  She has a prior history of gastric bypass.  She has been on oral iron for about 8 months now which she takes every other day.Her most recent CBC from 05/10/2021 showed white cell count of 11, H&H of 11.2/35.1 and a platelet count of 303.  Ferritin levels were low at 13.  Iron and TIBC showed elevated TIBC of 482 with an iron saturation of 10%.  B12 levels were normal at 491.  Folate levels were normal.  Patient currently reports feeling fatigued but denies other complaints at this time.  She does report feeling constipated with daily iron.  ? ?Interval History: patient is a 36 year old female, currently  [redacted] weeks pregnant with PMH of gastric bypass, on oral iron, now s/p venofer x 5 (Last 06/20/2021) who returns to clinic for follow up. Today she reports feeling a bit more fatigued recently and believes that her iron stores may be low. She wishes to get back on Venofer treatments if warranted.  ? ?ECOG PS- 0 ? ?Review of systems- Review of Systems  ?Constitutional:  Positive for malaise/fatigue. Negative for chills, fever and weight loss.  ?HENT:  Negative for congestion, ear discharge and nosebleeds.   ?Eyes:  Negative for blurred vision.  ?Respiratory:  Negative for cough, hemoptysis, sputum production, shortness of breath and wheezing.   ?Cardiovascular:  Negative for chest pain, palpitations, orthopnea and claudication.  ?Gastrointestinal:  Negative for abdominal pain, blood in stool, constipation, diarrhea, heartburn, melena, nausea and vomiting.  ?Genitourinary:  Negative for dysuria, flank pain, frequency, hematuria and urgency.  ?Musculoskeletal:  Negative for back pain, joint pain and myalgias.  ?Skin:  Negative for rash.  ?Neurological:  Negative for dizziness, tingling, focal weakness, seizures, weakness and headaches.  ?Endo/Heme/Allergies:  Does not bruise/bleed easily.  ?Psychiatric/Behavioral:  Negative for depression and suicidal ideas. The patient does not have insomnia.   ? ?No Known Allergies ? ? ?Patient Active Problem List  ? Diagnosis Date Noted  ? AMA (advanced maternal age) multigravida 35+ 08/13/2021  ? History of C-section 08/13/2021  ? Rh negative status during pregnancy 08/13/2021  ? Iron deficiency anemia 05/15/2021  ? Vitamin D deficiency 05/15/2021  ? Supervision of high risk pregnancy, antepartum 04/23/2021  ? Depression 04/23/2021  ? Benign hypertension 06/13/2020  ? ? ?Past Medical  History:  ?Diagnosis Date  ? IDA (iron deficiency anemia)   ? Incomplete abortion 01/14/2021  ? Iron deficiency anemia, unspecified iron deficiency anemia type   ? Obesity   ? PONV (postoperative nausea  and vomiting)   ? Vitamin D deficiency   ? ? ?Past Surgical History:  ?Procedure Laterality Date  ? CESAREAN SECTION  03/23/2011  ? DILATION AND EVACUATION N/A 01/22/2021  ? Procedure: DILATATION AND EVACUATION;  Surgeon: Natale MilchSchuman, Christanna R, MD;  Location: [facility] ORS;  Service: Gynecology;  Laterality: N/A;  ? ESOPHAGOGASTRODUODENOSCOPY  2016  ? GASTRIC BYPASS  2016  ? ? ?Social History  ? ?Socioeconomic History  ? Marital status: Single  ?  Spouse name: aaron  ? Number of children: 1  ? Years of education: Not on file  ? Highest education level: Not on file  ?Occupational History  ? Not on file  ?Tobacco Use  ? Smoking status: Never  ? Smokeless tobacco: Never  ?Vaping Use  ? Vaping Use: Never used  ?Substance and Sexual Activity  ? Alcohol use: No  ? Drug use: Never  ? Sexual activity: Yes  ?  Partners: Male  ?  Birth control/protection: None  ?  Comment: hx mirena x 10 yr/removed 05/11/2020  ?Other Topics Concern  ? Not on file  ?Social History Narrative  ? Lives with mother and child  ? ?Social Determinants of Health  ? ?Financial Resource Strain: Not on file  ?Food Insecurity: No Food Insecurity  ? Worried About Programme researcher, broadcasting/film/videounning Out of Food in the Last Year: Never true  ? Ran Out of Food in the Last Year: Never true  ?Transportation Needs: No Transportation Needs  ? Lack of Transportation (Medical): No  ? Lack of Transportation (Non-Medical): No  ?Physical Activity: Not on file  ?Stress: Not on file  ?Social Connections: Not on file  ?Intimate Partner Violence: Not At Risk  ? Fear of Current or Ex-Partner: No  ? Emotionally Abused: No  ? Physically Abused: No  ? Sexually Abused: No  ? ?  ?Family History  ?Problem Relation Age of Onset  ? Hypertension Mother   ? Diabetes Mother   ? Hypertension Father   ? Diabetes Father   ? Bipolar disorder Sister   ? Schizophrenia Sister   ? Narcolepsy Sister   ? Healthy Sister   ? Hypertension Maternal Grandmother   ? Diabetes Maternal Grandmother   ? Hypertension Maternal Grandfather    ? Diabetes Maternal Grandfather   ? Hypertension Paternal Grandmother   ? Hypertension Paternal Grandfather   ? ? ? ?Current Outpatient Medications:  ?  acetaminophen (TYLENOL) 500 MG tablet, Take 500 mg by mouth every 8 (eight) hours as needed for moderate pain., Disp: , Rfl:  ?  Cholecalciferol (HM VITAMIN D3) 100 MCG (4000 UT) CAPS, Take 1 capsule (4,000 Units total) by mouth daily., Disp: 90 capsule, Rfl: 2 ?  doxylamine, Sleep, (UNISOM) 25 MG tablet, Take 50 mg by mouth at bedtime as needed., Disp: , Rfl:  ?  Doxylamine-Pyridoxine (DICLEGIS) 10-10 MG TBEC, Take 2 tablets by mouth at bedtime. If symptoms persist, add one tablet in the morning and one in the afternoon, Disp: 100 tablet, Rfl: 5 ?  famotidine (PEPCID) 20 MG tablet, Take 1 tablet (20 mg total) by mouth 2 (two) times daily., Disp: 60 tablet, Rfl: 2 ?  folic acid (FOLVITE) 800 MCG tablet, Take 400 mcg by mouth daily., Disp: , Rfl:  ?  ondansetron (ZOFRAN-ODT) 4 MG disintegrating tablet,  Take 1 tablet (4 mg total) by mouth every 6 (six) hours as needed for nausea., Disp: 120 tablet, Rfl: 3 ?  Prenatal Multivit-Min-Fe-FA (PRE-NATAL PO), Take by mouth., Disp: , Rfl:  ? ? ?Physical exam:  ?There were no vitals filed for this visit. ? ?Physical Exam ?Constitutional:   ?   General: She is not in acute distress. ?HENT:  ?   Head: Normocephalic.  ?Pulmonary:  ?   Effort: No respiratory distress.  ?Neurological:  ?   Mental Status: She is alert and oriented to person, place, and time.  ?Psychiatric:     ?   Mood and Affect: Mood normal.     ?   Behavior: Behavior normal.  ?  ? ?  Latest Ref Rng & Units 08/13/2021  ?  1:53 PM 07/19/2021  ?  9:16 AM 05/10/2021  ? 11:54 AM  ?CBC  ?WBC 4.0 - 10.5 K/uL 12.7   12.6   11.0    ?Hemoglobin 12.0 - 15.0 g/dL 42.5   95.6   38.7    ?Hematocrit 36.0 - 46.0 % 35.3   35.9   35.1    ?Platelets 150 - 400 K/uL 197   221   303    ? ?Iron/TIBC/Ferritin/ %Sat ?   ?Component Value Date/Time  ? IRON 108 08/13/2021 1353  ? IRON 48  05/10/2021 1154  ? TIBC 398 08/13/2021 1353  ? TIBC 482 (H) 05/10/2021 1154  ? FERRITIN 23 08/13/2021 1353  ? FERRITIN 13 (L) 05/10/2021 1154  ? IRONPCTSAT 27 08/13/2021 1353  ? IRONPCTSAT 10 (L) 05/11/19

## 2021-08-15 ENCOUNTER — Encounter: Payer: Medicaid Other | Admitting: Obstetrics

## 2021-08-15 ENCOUNTER — Other Ambulatory Visit: Payer: Medicaid Other

## 2021-08-15 LAB — VITAMIN B12: Vitamin B-12: 381 pg/mL (ref 232–1245)

## 2021-08-15 LAB — VITAMIN B1: Thiamine: 121.3 nmol/L (ref 66.5–200.0)

## 2021-08-15 LAB — VITAMIN D 25 HYDROXY (VIT D DEFICIENCY, FRACTURES): Vit D, 25-Hydroxy: 11 ng/mL — ABNORMAL LOW (ref 30.0–100.0)

## 2021-08-16 ENCOUNTER — Telehealth: Payer: Medicaid Other | Admitting: Nurse Practitioner

## 2021-08-16 ENCOUNTER — Encounter: Payer: Self-pay | Admitting: Nurse Practitioner

## 2021-08-21 ENCOUNTER — Other Ambulatory Visit: Payer: Self-pay | Admitting: Nurse Practitioner

## 2021-08-21 ENCOUNTER — Inpatient Hospital Stay: Payer: Medicaid Other

## 2021-08-21 VITALS — BP 122/74 | HR 80 | Temp 96.2°F | Resp 20

## 2021-08-21 DIAGNOSIS — D508 Other iron deficiency anemias: Secondary | ICD-10-CM | POA: Diagnosis not present

## 2021-08-21 MED ORDER — SODIUM CHLORIDE 0.9 % IV SOLN: 200.0000 mg | Freq: Once | INTRAVENOUS | Status: DC

## 2021-08-21 MED ORDER — IRON SUCROSE 20 MG/ML IV SOLN
200.0000 mg | Freq: Once | INTRAVENOUS | Status: AC
  Administered 2021-08-21: 200 mg via INTRAVENOUS

## 2021-08-21 MED ORDER — SODIUM CHLORIDE 0.9 % IV SOLN
INTRAVENOUS | Status: DC
  Filled 2021-08-21 (×2): qty 250

## 2021-08-21 NOTE — Progress Notes (Signed)
Venofer orders added. ?

## 2021-08-25 ENCOUNTER — Encounter: Payer: Self-pay | Admitting: Obstetrics and Gynecology

## 2021-08-25 ENCOUNTER — Observation Stay
Admission: EM | Admit: 2021-08-25 | Discharge: 2021-08-25 | Disposition: A | Discharge status: Home / Self Care | Payer: Medicaid Other | Attending: Obstetrics & Gynecology | Admitting: Obstetrics & Gynecology

## 2021-08-25 ENCOUNTER — Other Ambulatory Visit: Payer: Self-pay

## 2021-08-25 DIAGNOSIS — O4692 Antepartum hemorrhage, unspecified, second trimester: Secondary | ICD-10-CM | POA: Diagnosis present

## 2021-08-25 DIAGNOSIS — Z3A25 25 weeks gestation of pregnancy: Secondary | ICD-10-CM | POA: Diagnosis not present

## 2021-08-25 DIAGNOSIS — O0990 Supervision of high risk pregnancy, unspecified, unspecified trimester: Secondary | ICD-10-CM

## 2021-08-25 NOTE — Progress Notes (Signed)
Discharge instructions provided to patient. Patient verbalized understanding. Pt educated on signs and symptoms of labor, vaginal bleeding, LOF, fetal movement, and when to return to the hospital. Red flag signs reviewed by RN. Patient discharged home with significant other in stable condition.  ?

## 2021-08-25 NOTE — OB Triage Note (Addendum)
Pt G3P1 [redacted]w[redacted]d presents with c/o small amount of pink tinged bleeding when she went to the bathroom after having intercourse. Pt states there was blood in toilet and when she wiped. +FM. Denies bleeding since 1630. VSS. Hx of c/s d/t fetal distress. Pt denies pain/cramping.  ?

## 2021-08-25 NOTE — Discharge Summary (Signed)
  See FPN 

## 2021-08-25 NOTE — Final Progress Note (Signed)
Physician Final Progress Note ? ?Patient ID: ?Haley Kramer ?MRN: [id number] ?DOB/AGE: [birth year] 36 y.o. ? ?Admit date: 08/25/2021 ?Admitting provider: Nadara Mustard, MD ?Discharge date: 08/25/2021 ? ?Admission Diagnoses: Second trimester bleeding ? ?Discharge Diagnoses:  ? Second trimester bleeding ? 25 weeks ? ?Consults: None ? ?Significant Findings/ Diagnostic Studies: Patient presented for evaluation of labor.  Patient had cervical exam by RN and this was reported to me. I reviewed her vital signs and fetal tracing, both of which were reassuring.  Patient was discharge as she was not laboring. ? ?Procedures: FHR reassuring ? ?Discharge Condition: good ? ?Disposition: Discharge disposition: 01-Home or Self Care ? ? ? ? ? ? ?Diet: Regular diet ? ?Discharge Activity: Activity as tolerated ? ? ? ? ?Total time spent taking care of this patient: Triage ? ?Signed: ?Letitia Libra ?08/25/2021, 10:00 PM ?

## 2021-08-26 ENCOUNTER — Ambulatory Visit: Payer: Medicaid Other | Admitting: Clinical

## 2021-08-26 DIAGNOSIS — Z91199 Patient's noncompliance with other medical treatment and regimen due to unspecified reason: Secondary | ICD-10-CM

## 2021-08-28 ENCOUNTER — Inpatient Hospital Stay: Payer: Medicaid Other

## 2021-08-28 VITALS — BP 111/64 | HR 85 | Temp 98.2°F | Resp 17

## 2021-08-28 DIAGNOSIS — D508 Other iron deficiency anemias: Secondary | ICD-10-CM

## 2021-08-28 MED ORDER — IRON SUCROSE 20 MG/ML IV SOLN
200.0000 mg | Freq: Once | INTRAVENOUS | Status: AC
  Administered 2021-08-28: 200 mg via INTRAVENOUS

## 2021-08-28 MED ORDER — SODIUM CHLORIDE 0.9 % IV SOLN: 200.0000 mg | Freq: Once | INTRAVENOUS | Status: DC

## 2021-08-28 MED ORDER — SODIUM CHLORIDE 0.9 % IV SOLN
Freq: Once | INTRAVENOUS | Status: AC
  Filled 2021-08-28: qty 250

## 2021-08-28 NOTE — Patient Instructions (Signed)

## 2021-08-28 NOTE — Progress Notes (Signed)
Patient tolerated Venofer infusion well, no questions/concerns voiced. Patient stable at discharge. Refused AVS .   

## 2021-09-03 ENCOUNTER — Encounter: Payer: Self-pay | Admitting: Obstetrics and Gynecology

## 2021-09-04 ENCOUNTER — Inpatient Hospital Stay: Payer: Medicaid Other | Attending: Nurse Practitioner

## 2021-09-04 VITALS — BP 109/69 | HR 79 | Temp 95.8°F | Resp 16

## 2021-09-04 DIAGNOSIS — O99012 Anemia complicating pregnancy, second trimester: Secondary | ICD-10-CM | POA: Diagnosis present

## 2021-09-04 DIAGNOSIS — D508 Other iron deficiency anemias: Secondary | ICD-10-CM | POA: Insufficient documentation

## 2021-09-04 DIAGNOSIS — Z9884 Bariatric surgery status: Secondary | ICD-10-CM | POA: Diagnosis not present

## 2021-09-04 MED ORDER — IRON SUCROSE 20 MG/ML IV SOLN
200.0000 mg | Freq: Once | INTRAVENOUS | Status: AC
  Administered 2021-09-04: 200 mg via INTRAVENOUS
  Filled 2021-09-04: qty 10

## 2021-09-04 MED ORDER — SODIUM CHLORIDE 0.9 % IV SOLN
INTRAVENOUS | Status: DC | PRN
  Filled 2021-09-04: qty 250

## 2021-09-04 MED ORDER — SODIUM CHLORIDE 0.9 % IV SOLN: 200.0000 mg | Freq: Once | INTRAVENOUS | Status: DC

## 2021-09-10 ENCOUNTER — Other Ambulatory Visit: Payer: Medicaid Other

## 2021-09-10 ENCOUNTER — Encounter: Payer: Medicaid Other | Admitting: Obstetrics and Gynecology

## 2021-09-11 ENCOUNTER — Inpatient Hospital Stay: Payer: Medicaid Other | Attending: Nurse Practitioner

## 2021-09-11 VITALS — BP 123/79 | HR 86 | Temp 97.9°F | Resp 18

## 2021-09-11 DIAGNOSIS — O99012 Anemia complicating pregnancy, second trimester: Secondary | ICD-10-CM | POA: Insufficient documentation

## 2021-09-11 DIAGNOSIS — O99019 Anemia complicating pregnancy, unspecified trimester: Secondary | ICD-10-CM

## 2021-09-11 DIAGNOSIS — D508 Other iron deficiency anemias: Secondary | ICD-10-CM | POA: Insufficient documentation

## 2021-09-11 MED ORDER — SODIUM CHLORIDE 0.9 % IV SOLN: 200.0000 mg | Freq: Once | INTRAVENOUS | Status: DC

## 2021-09-11 MED ORDER — IRON SUCROSE 20 MG/ML IV SOLN
200.0000 mg | Freq: Once | INTRAVENOUS | Status: AC
  Administered 2021-09-11: 200 mg via INTRAVENOUS
  Filled 2021-09-11: qty 10

## 2021-09-11 NOTE — Patient Instructions (Signed)
[facility]  Discharge Instructions: ?Thank you for choosing [facility] to provide your oncology and hematology care.  ?If you have a lab appointment with the Cancer Center, please go directly to the Cancer Center and check in at the registration area. ? ?Wear comfortable clothing and clothing appropriate for easy access to any Portacath or PICC line.  ? ?We strive to give you quality time with your provider. You may need to reschedule your appointment if you arrive late (15 or more minutes).  Arriving late affects you and other patients whose appointments are after yours.  Also, if you miss three or more appointments without notifying the office, you may be dismissed from the clinic at the provider?s discretion.    ?  ?For prescription refill requests, have your pharmacy contact our office and allow 72 hours for refills to be completed.   ? ?Today you received the following chemotherapy and/or immunotherapy agents VENOFER ?    ?  ?To help prevent nausea and vomiting after your treatment, we encourage you to take your nausea medication as directed. ? ?BELOW ARE SYMPTOMS THAT SHOULD BE REPORTED IMMEDIATELY: ?*FEVER GREATER THAN 100.4 F (38 ?C) OR HIGHER ?*CHILLS OR SWEATING ?*NAUSEA AND VOMITING THAT IS NOT CONTROLLED WITH YOUR NAUSEA MEDICATION ?*UNUSUAL SHORTNESS OF BREATH ?*UNUSUAL BRUISING OR BLEEDING ?*URINARY PROBLEMS (pain or burning when urinating, or frequent urination) ?*BOWEL PROBLEMS (unusual diarrhea, constipation, pain near the anus) ?TENDERNESS IN MOUTH AND THROAT WITH OR WITHOUT PRESENCE OF ULCERS (sore throat, sores in mouth, or a toothache) ?UNUSUAL RASH, SWELLING OR PAIN  ?UNUSUAL VAGINAL DISCHARGE OR ITCHING  ? ?Items with * indicate a potential emergency and should be followed up as soon as possible or go to the Emergency Department if any problems should occur. ? ?Please show the CHEMOTHERAPY ALERT CARD or IMMUNOTHERAPY ALERT CARD at check-in to  the Emergency Department and triage nurse. ? ?Should you have questions after your visit or need to cancel or reschedule your appointment, please contact [facility]  [phone] and follow the prompts.  Office hours are 8:00 a.m. to 4:30 p.m. Monday - Friday. Please note that voicemails left after 4:00 p.m. may not be returned until the following business day.  We are closed weekends and major holidays. You have access to a nurse at all times for urgent questions. Please call the main number to the clinic [phone] and follow the prompts. ? ?For any non-urgent questions, you may also contact your provider using MyChart. We now offer e-Visits for anyone 18 and older to request care online for non-urgent symptoms. For details visit [email or web address]. ?  ?Also download the MyChart app! Go to the app store, search "MyChart", open the app, select [facility], and log in with your MyChart username and password. ? ?Due to Covid, a mask is required upon entering the hospital/clinic. If you do not have a mask, one will be given to you upon arrival. For doctor visits, patients may have 1 support person aged 18 or older with them. For treatment visits, patients cannot have anyone with them due to current Covid guidelines and our immunocompromised population.  ? ?Iron Sucrose Injection ?What is this medication? ?IRON SUCROSE (EYE ern SOO krose) treats low levels of iron (iron deficiency anemia) in people with kidney disease. Iron is a mineral that plays an important role in making red blood cells, which carry oxygen from your lungs to the rest of your body. ?This medicine   may be used for other purposes; ask your health care provider or pharmacist if you have questions. ?COMMON BRAND NAME(S): Venofer ?What should I tell my care team before I take this medication? ?They need to know if you have any of these conditions: ?Anemia not caused by low iron levels ?Heart disease ?High levels of  iron in the blood ?Kidney disease ?Liver disease ?An unusual or allergic reaction to iron, other medications, foods, dyes, or preservatives ?Pregnant or trying to get pregnant ?Breast-feeding ?How should I use this medication? ?This medication is for infusion into a vein. It is given in a hospital or clinic setting. ?Talk to your care team about the use of this medication in children. While this medication may be prescribed for children as young as 2 years for selected conditions, precautions do apply. ?Overdosage: If you think you have taken too much of this medicine contact a poison control center or emergency room at once. ?NOTE: This medicine is only for you. Do not share this medicine with others. ?What if I miss a dose? ?It is important not to miss your dose. Call your care team if you are unable to keep an appointment. ?What may interact with this medication? ?Do not take this medication with any of the following: ?Deferoxamine ?Dimercaprol ?Other iron products ?This medication may also interact with the following: ?Chloramphenicol ?Deferasirox ?This list may not describe all possible interactions. Give your health care provider a list of all the medicines, herbs, non-prescription drugs, or dietary supplements you use. Also tell them if you smoke, drink alcohol, or use illegal drugs. Some items may interact with your medicine. ?What should I watch for while using this medication? ?Visit your care team regularly. Tell your care team if your symptoms do not start to get better or if they get worse. You may need blood work done while you are taking this medication. ?You may need to follow a special diet. Talk to your care team. Foods that contain iron include: whole grains/cereals, dried fruits, beans, or peas, leafy green vegetables, and organ meats (liver, kidney). ?What side effects may I notice from receiving this medication? ?Side effects that you should report to your care team as soon as  possible: ?Allergic reactions--skin rash, itching, hives, swelling of the face, lips, tongue, or throat ?Low blood pressure--dizziness, feeling faint or lightheaded, blurry vision ?Shortness of breath ?Side effects that usually do not require medical attention (report to your care team if they continue or are bothersome): ?Flushing ?Headache ?Joint pain ?Muscle pain ?Nausea ?Pain, redness, or irritation at injection site ?This list may not describe all possible side effects. Call your doctor for medical advice about side effects. You may report side effects to FDA at 1-800-FDA-1088. ?Where should I keep my medication? ?This medication is given in a hospital or clinic and will not be stored at home. ?NOTE: This sheet is a summary. It may not cover all possible information. If you have questions about this medicine, talk to your doctor, pharmacist, or health care provider. ?? 2023 Elsevier/Gold Standard (2020-09-21 00:00:00) ? ?

## 2021-09-13 ENCOUNTER — Encounter: Payer: Self-pay | Admitting: Obstetrics and Gynecology

## 2021-09-13 ENCOUNTER — Ambulatory Visit: Payer: Medicaid Other | Admitting: Family Medicine

## 2021-09-16 ENCOUNTER — Other Ambulatory Visit: Payer: Self-pay | Admitting: *Deleted

## 2021-09-16 ENCOUNTER — Ambulatory Visit (INDEPENDENT_AMBULATORY_CARE_PROVIDER_SITE_OTHER): Payer: Medicaid Other | Admitting: Family Medicine

## 2021-09-16 ENCOUNTER — Encounter: Payer: Self-pay | Admitting: Family Medicine

## 2021-09-16 ENCOUNTER — Other Ambulatory Visit: Payer: Medicaid Other

## 2021-09-16 VITALS — BP 127/65 | HR 103 | Wt 213.1 lb

## 2021-09-16 DIAGNOSIS — Z3A29 29 weeks gestation of pregnancy: Secondary | ICD-10-CM

## 2021-09-16 DIAGNOSIS — O99019 Anemia complicating pregnancy, unspecified trimester: Secondary | ICD-10-CM

## 2021-09-16 DIAGNOSIS — Z6791 Unspecified blood type, Rh negative: Secondary | ICD-10-CM | POA: Diagnosis not present

## 2021-09-16 DIAGNOSIS — Z23 Encounter for immunization: Secondary | ICD-10-CM

## 2021-09-16 DIAGNOSIS — O09529 Supervision of elderly multigravida, unspecified trimester: Secondary | ICD-10-CM

## 2021-09-16 DIAGNOSIS — O26899 Other specified pregnancy related conditions, unspecified trimester: Secondary | ICD-10-CM | POA: Diagnosis not present

## 2021-09-16 DIAGNOSIS — O0990 Supervision of high risk pregnancy, unspecified, unspecified trimester: Secondary | ICD-10-CM | POA: Diagnosis not present

## 2021-09-16 DIAGNOSIS — F419 Anxiety disorder, unspecified: Secondary | ICD-10-CM

## 2021-09-16 DIAGNOSIS — F32A Depression, unspecified: Secondary | ICD-10-CM

## 2021-09-16 DIAGNOSIS — Z98891 History of uterine scar from previous surgery: Secondary | ICD-10-CM

## 2021-09-16 LAB — CBC
Hematocrit: 32.3 % — ABNORMAL LOW (ref 34.0–46.6)
Hemoglobin: 10.9 g/dL — ABNORMAL LOW (ref 11.1–15.9)
MCH: 31.1 pg (ref 26.6–33.0)
MCHC: 33.7 g/dL (ref 31.5–35.7)
MCV: 92 fL (ref 79–97)
Platelets: 165 10*3/uL (ref 150–450)
RBC: 3.51 x10E6/uL — ABNORMAL LOW (ref 3.77–5.28)
RDW: 11.6 % — ABNORMAL LOW (ref 11.7–15.4)
WBC: 12.2 10*3/uL — ABNORMAL HIGH (ref 3.4–10.8)

## 2021-09-16 MED ORDER — RHO D IMMUNE GLOBULIN 1500 UNIT/2ML IJ SOSY
300.0000 ug | PREFILLED_SYRINGE | Freq: Once | INTRAMUSCULAR | Status: AC
  Administered 2021-09-16: 300 ug via INTRAMUSCULAR

## 2021-09-16 NOTE — Progress Notes (Signed)
? ?PRENATAL VISIT NOTE ? ?Subjective:  ?Haley Kramer is a 36 y.o. G3P1011 at [redacted]w[redacted]d being seen today for ongoing prenatal care.  She is currently monitored for the following issues for this high-risk pregnancy and has Benign hypertension; Supervision of high risk pregnancy, antepartum; Depression; Anemia in pregnancy; Vitamin D deficiency; AMA (advanced maternal age) multigravida 75+; History of C-section; Rh negative status during pregnancy; and Indication for care/intervention related to labor/delivery, antepartum on their problem list. ? ?Patient reports increased depression and anxiety since last visit. Reports this is due to recent exams that have been stressing her out and a death in the family. Denies SI/HI or thoughts of self harm. Would like to follow up with behavioral health to assist with this, has been meaning to make an appointment. She reports some current nausea after drinking the glucose drink for her GTT today. She is otherwise doing well. She is requesting a note to help her have an extension to make up some of her exams.  Contractions: Not present. Vag. Bleeding: None.  Movement: Present. Denies leaking of fluid.  ? ?The following portions of the patient's history were reviewed and updated as appropriate: allergies, current medications, past family history, past medical history, past social history, past surgical history and problem list.  ? ?Objective:  ? ?Vitals:  ? 09/16/21 0938  ?BP: 127/65  ?Pulse: (!) 103  ?Weight: 213 lb 1.6 oz (96.7 kg)  ? ? ?Fetal Status: Fetal Heart Rate (bpm): 150   Movement: Present    ? ?General:  Alert, oriented and cooperative. Patient is in no acute distress.  ?Skin: Skin is warm and dry. No rash noted.   ?Cardiovascular: Normal heart rate noted.  ?Respiratory: Normal respiratory effort, no problems with respiration noted.  ?Abdomen: Soft, gravid, nontender, appropriate for gestational age.       ?Pelvic: Cervical exam deferred.  ?Extremities: Normal range of  motion.  Edema: Trace.  ?Mental Status: Normal mood and affect. Normal behavior. Normal judgment and thought content.  ? ?Assessment and Plan:  ?Pregnancy: G3P1011 at [redacted]w[redacted]d ? ?1. Supervision of high risk pregnancy, antepartum ?2. [redacted] weeks gestation of pregnancy ?Doing well overall. FHT within normal limits. VSS. 28 week labs obtained today. Tdap and Rhogam also given today. Will follow up results. Next visit in 2 weeks.  ?- Tdap vaccine greater than or equal to 7yo IM ?- rho (d) immune globulin (RHIG/RHOPHYLAC) injection 300 mcg ? ?3. Anxiety and depression ?Increased symptoms since last visit due to stress with school/exams and family stressors. No safety concerns today. Will schedule visit with behavioral health as requested and closely monitor at next visit. Note provided for extension to complete exams.  ? ?4. Antepartum multigravida of advanced maternal age ? ?5. History of C-section ?Desires VBAC. TOLAC consent reviewed with patient in office and signed. All questions and concerns addressed.  ? ?6. Rh negative, antepartum ?Rhogam given today.  ?- rho (d) immune globulin (RHIG/RHOPHYLAC) injection 300 mcg ? ?7. Antepartum anemia ?Receiving Venofer infusions. Last hemoglobin 11.4 on 4/4. Repeat CBC obtained today. Will follow up results.  ? ?Preterm labor symptoms and general obstetric precautions including but not limited to vaginal bleeding, contractions, leaking of fluid and fetal movement were reviewed in detail with the patient. ? ?Please refer to After Visit Summary for other counseling recommendations.  ? ?Return in about 2 weeks (around 09/30/2021) for follow up HR OB visit. ? ?Future Appointments  ?Date Time Provider [place]  ?09/18/2021  2:00 PM [facility]  INFUSION CHAIR 10 [facility] None  ?09/25/2021  9:15 AM [facility] [place]  ?09/26/2021 10:00 AM [facility] US[facility]  ?09/30/2021  2:15 PM Radene Gunning, MD [facility]  ?02/12/2022  1:45 PM [facility]  None  ?02/14/2022  1:00 PM Sindy Guadeloupe, MD [facility] None  ? ?Genia Del, MD ? ?

## 2021-09-17 LAB — HIV ANTIBODY (ROUTINE TESTING W REFLEX): HIV Screen 4th Generation wRfx: NONREACTIVE

## 2021-09-17 LAB — RPR: RPR Ser Ql: NONREACTIVE

## 2021-09-17 LAB — GLUCOSE TOLERANCE, 2 HOURS W/ 1HR
Glucose, 1 hour: 100 mg/dL (ref 70–179)
Glucose, 2 hour: 44 mg/dL — ABNORMAL LOW (ref 70–152)
Glucose, Fasting: 73 mg/dL (ref 70–91)

## 2021-09-18 ENCOUNTER — Inpatient Hospital Stay: Payer: Medicaid Other

## 2021-09-18 VITALS — BP 113/60 | HR 77 | Temp 97.2°F | Resp 18

## 2021-09-18 DIAGNOSIS — O99019 Anemia complicating pregnancy, unspecified trimester: Secondary | ICD-10-CM

## 2021-09-18 DIAGNOSIS — D508 Other iron deficiency anemias: Secondary | ICD-10-CM | POA: Diagnosis not present

## 2021-09-18 MED ORDER — SODIUM CHLORIDE 0.9 % IV SOLN
Freq: Once | INTRAVENOUS | Status: AC
  Filled 2021-09-18: qty 250

## 2021-09-18 MED ORDER — IRON SUCROSE 20 MG/ML IV SOLN
200.0000 mg | Freq: Once | INTRAVENOUS | Status: AC
  Administered 2021-09-18: 200 mg via INTRAVENOUS

## 2021-09-18 MED ORDER — SODIUM CHLORIDE 0.9 % IV SOLN: 200.0000 mg | Freq: Once | INTRAVENOUS | Status: DC

## 2021-09-18 NOTE — Patient Instructions (Signed)

## 2021-09-18 NOTE — BH Specialist Note (Signed)
Integrated Behavioral Health via Telemedicine Visit ? ?09/25/2021 ?BURNELL HARTL ?[id number] ? ?Number of [place] Clinician visits: 1- Initial Visit ? ?Session Start time: 0919 ?  ?Session End time: 0951 ? ?Total time in minutes: 32 ? ? ?Referring Provider: Arlina Robes, MD ?Patient/Family location: Home ?[facility] Provider location: [facility] [employer] [facility] ? ?All persons participating in visit: [name] and Cha[place]in  ? ?Types of Service: Individual psychotherapy and Telephone visit ? ?I connected with Haley Kramer and/or Haley Kramer  n/a  via  Telephone or Video Enabled Telemedicine Application  (Video is Caregility application) and verified that I am speaking with the correct person using two identifiers. Discussed confidentiality: Yes  ? ?I discussed the limitations of telemedicine and the availability of in person appointments.  Discussed there is a possibility of technology failure and discussed alternative modes of communication if that failure occurs. ? ?I discussed that engaging in this telemedicine visit, they consent to the provision of behavioral healthcare and the services will be billed under their insurance. ? ?Patient and/or legal guardian expressed understanding and consented to Telemedicine visit: Yes  ? ?Presenting Concerns: ?Patient and/or family reports the following symptoms/concerns: Increased depression and anxiety with current life stress (managing school full time, work part time, 36yo with ADHD/ODD; high risk pregnancy); pt uses self-coping strategies to manage. Pt's goals are to get through summer semester of school and a healthy pregnancy. ?Duration of problem: Increase current pregnancy; Severity of problem:  moderately severe ? ?Patient and/or Family's Strengths/Protective Factors: ?Social connections, Concrete supports in place (healthy food, safe environments, etc.), and Sense of  purpose ? ?Goals Addressed: ?Patient will: ? Reduce symptoms of: anxiety, depression, and stress  ? Increase knowledge and/or ability of: healthy habits and self-management skills  ? Demonstrate ability to: Increase healthy adjustment to current life circumstances ? ?Progress towards Goals: ?Ongoing ? ?Interventions: ?Interventions utilized:  Solution-Focused Strategies and Psychoeducation and/or Health Education ?Standardized Assessments completed:  PHQ9/GAD7 given within two weeks ? ?Patient and/or Family Response: [name] and Haley Kramer  ? ? ?Assessment: ?Patient currently experiencing Generalized anxiety disorder.  ? ?Patient may benefit from psychoeducation and brief therapeutic interventions regarding coping with symptoms of depression, anxiety, life stress ?. ? ?Plan: ?Follow up with behavioral health clinician on : Two weeks ?Behavioral recommendations:  ?-Continue taking prenatal vitamin daily ?-Continue using self-coping strategies as needed (breathing exercise, counting, music, sleep sounds, etc.) ?-Begin Worry Time strategy, as discussed. Start by setting up start and end time reminders on phone today; continue daily for two weeks. ?-Consider reading through information on After Visit Summary; use as needed ? ?Referral(s): [place] (In Clinic) ? ?I discussed the assessment and treatment plan with the patient and/or parent/guardian. They were provided an opportunity to ask questions and all were answered. They agreed with the plan and demonstrated an understanding of the instructions. ?  ?They were advised to call back or seek an in-person evaluation if the symptoms worsen or if the condition fails to improve as anticipated. ? ?Garlan Fair, LCSW ? ? ?  09/16/2021  ?  9:48 AM 08/07/2021  ?  8:45 AM 12/19/2020  ?  9:41 AM 10/29/2020  ? 10:09 AM  ?Depression screen PHQ 2/9  ?Decreased Interest 2 1 0 3  ?Down, Depressed, Hopeless 3 1 0 2  ?PHQ - 2 Score 5 2  0 5  ?Altered sleeping 2 2  2  ?Tired, decreased energy 3 2  3   ?Change in appetite 1 0  0  ?Feeling bad or failure about yourself  3 3  2   ?Trouble concentrating 3 2  0  ?Moving slowly or fidgety/restless 1 1  0  ?Suicidal thoughts 0 0  0  ?PHQ-9 Score 18 12  12   ?Difficult doing work/chores    Very difficult  ? ? ?  09/16/2021  ?  9:48 AM 08/07/2021  ?  8:49 AM  ?GAD 7 : Generalized Anxiety Score  ?Nervous, Anxious, on Edge 3 0  ?Control/stop worrying 3 3  ?Worry too much - different things 3 3  ?Trouble relaxing 3 2  ?Restless 2 0  ?Easily annoyed or irritable 3 1  ?Afraid - awful might happen 3 1  ?Total GAD 7 Score 20 10  ? ? ? ?

## 2021-09-20 ENCOUNTER — Other Ambulatory Visit: Payer: Self-pay | Admitting: *Deleted

## 2021-09-20 DIAGNOSIS — O99210 Obesity complicating pregnancy, unspecified trimester: Secondary | ICD-10-CM

## 2021-09-20 DIAGNOSIS — O09529 Supervision of elderly multigravida, unspecified trimester: Secondary | ICD-10-CM

## 2021-09-20 DIAGNOSIS — Z3689 Encounter for other specified antenatal screening: Secondary | ICD-10-CM

## 2021-09-23 ENCOUNTER — Ambulatory Visit: Payer: Medicaid Other | Admitting: *Deleted

## 2021-09-23 ENCOUNTER — Ambulatory Visit: Payer: Medicaid Other | Attending: Obstetrics

## 2021-09-23 ENCOUNTER — Encounter: Payer: Self-pay | Admitting: *Deleted

## 2021-09-23 VITALS — BP 114/59 | HR 77

## 2021-09-23 DIAGNOSIS — O0990 Supervision of high risk pregnancy, unspecified, unspecified trimester: Secondary | ICD-10-CM | POA: Diagnosis present

## 2021-09-23 DIAGNOSIS — Z3689 Encounter for other specified antenatal screening: Secondary | ICD-10-CM | POA: Diagnosis present

## 2021-09-23 DIAGNOSIS — O09523 Supervision of elderly multigravida, third trimester: Secondary | ICD-10-CM

## 2021-09-23 DIAGNOSIS — O99213 Obesity complicating pregnancy, third trimester: Secondary | ICD-10-CM | POA: Diagnosis not present

## 2021-09-23 DIAGNOSIS — O09293 Supervision of pregnancy with other poor reproductive or obstetric history, third trimester: Secondary | ICD-10-CM

## 2021-09-23 DIAGNOSIS — O09529 Supervision of elderly multigravida, unspecified trimester: Secondary | ICD-10-CM | POA: Insufficient documentation

## 2021-09-23 DIAGNOSIS — O403XX0 Polyhydramnios, third trimester, not applicable or unspecified: Secondary | ICD-10-CM

## 2021-09-23 DIAGNOSIS — Z148 Genetic carrier of other disease: Secondary | ICD-10-CM

## 2021-09-23 DIAGNOSIS — E669 Obesity, unspecified: Secondary | ICD-10-CM

## 2021-09-23 DIAGNOSIS — Z3A30 30 weeks gestation of pregnancy: Secondary | ICD-10-CM

## 2021-09-23 DIAGNOSIS — O360130 Maternal care for anti-D [Rh] antibodies, third trimester, not applicable or unspecified: Secondary | ICD-10-CM

## 2021-09-23 DIAGNOSIS — O99210 Obesity complicating pregnancy, unspecified trimester: Secondary | ICD-10-CM | POA: Diagnosis present

## 2021-09-23 DIAGNOSIS — O285 Abnormal chromosomal and genetic finding on antenatal screening of mother: Secondary | ICD-10-CM

## 2021-09-24 ENCOUNTER — Ambulatory Visit (HOSPITAL_BASED_OUTPATIENT_CLINIC_OR_DEPARTMENT_OTHER): Payer: Medicaid Other | Admitting: *Deleted

## 2021-09-24 ENCOUNTER — Encounter: Payer: Self-pay | Admitting: *Deleted

## 2021-09-24 ENCOUNTER — Other Ambulatory Visit: Payer: Self-pay | Admitting: *Deleted

## 2021-09-24 ENCOUNTER — Ambulatory Visit: Payer: Medicaid Other | Attending: Obstetrics and Gynecology

## 2021-09-24 ENCOUNTER — Ambulatory Visit: Payer: Medicaid Other | Admitting: *Deleted

## 2021-09-24 ENCOUNTER — Other Ambulatory Visit: Payer: Self-pay

## 2021-09-24 ENCOUNTER — Ambulatory Visit: Payer: Medicaid Other

## 2021-09-24 ENCOUNTER — Other Ambulatory Visit: Payer: Self-pay | Admitting: Obstetrics and Gynecology

## 2021-09-24 VITALS — BP 139/76 | HR 105 | Wt 214.0 lb

## 2021-09-24 DIAGNOSIS — O34219 Maternal care for unspecified type scar from previous cesarean delivery: Secondary | ICD-10-CM

## 2021-09-24 DIAGNOSIS — O09299 Supervision of pregnancy with other poor reproductive or obstetric history, unspecified trimester: Secondary | ICD-10-CM | POA: Insufficient documentation

## 2021-09-24 DIAGNOSIS — O99213 Obesity complicating pregnancy, third trimester: Secondary | ICD-10-CM | POA: Insufficient documentation

## 2021-09-24 DIAGNOSIS — R638 Other symptoms and signs concerning food and fluid intake: Secondary | ICD-10-CM | POA: Diagnosis not present

## 2021-09-24 DIAGNOSIS — Z3A30 30 weeks gestation of pregnancy: Secondary | ICD-10-CM | POA: Insufficient documentation

## 2021-09-24 DIAGNOSIS — O403XX0 Polyhydramnios, third trimester, not applicable or unspecified: Secondary | ICD-10-CM | POA: Insufficient documentation

## 2021-09-24 DIAGNOSIS — Z3A31 31 weeks gestation of pregnancy: Secondary | ICD-10-CM

## 2021-09-24 DIAGNOSIS — O35BXX0 Maternal care for other (suspected) fetal abnormality and damage, fetal cardiac anomalies, not applicable or unspecified: Secondary | ICD-10-CM | POA: Diagnosis not present

## 2021-09-24 DIAGNOSIS — E669 Obesity, unspecified: Secondary | ICD-10-CM

## 2021-09-24 DIAGNOSIS — O09523 Supervision of elderly multigravida, third trimester: Secondary | ICD-10-CM

## 2021-09-24 DIAGNOSIS — Z148 Genetic carrier of other disease: Secondary | ICD-10-CM

## 2021-09-24 DIAGNOSIS — O09521 Supervision of elderly multigravida, first trimester: Secondary | ICD-10-CM

## 2021-09-24 DIAGNOSIS — O283 Abnormal ultrasonic finding on antenatal screening of mother: Secondary | ICD-10-CM | POA: Diagnosis not present

## 2021-09-24 DIAGNOSIS — O35HXX0 Maternal care for other (suspected) fetal abnormality and damage, fetal lower extremities anomalies, not applicable or unspecified: Secondary | ICD-10-CM

## 2021-09-24 DIAGNOSIS — O09513 Supervision of elderly primigravida, third trimester: Secondary | ICD-10-CM

## 2021-09-24 DIAGNOSIS — Z6833 Body mass index (BMI) 33.0-33.9, adult: Secondary | ICD-10-CM

## 2021-09-24 DIAGNOSIS — O360130 Maternal care for anti-D [Rh] antibodies, third trimester, not applicable or unspecified: Secondary | ICD-10-CM

## 2021-09-24 DIAGNOSIS — Z362 Encounter for other antenatal screening follow-up: Secondary | ICD-10-CM

## 2021-09-24 DIAGNOSIS — O289 Unspecified abnormal findings on antenatal screening of mother: Secondary | ICD-10-CM

## 2021-09-24 DIAGNOSIS — O99843 Bariatric surgery status complicating pregnancy, third trimester: Secondary | ICD-10-CM

## 2021-09-24 DIAGNOSIS — O0990 Supervision of high risk pregnancy, unspecified, unspecified trimester: Secondary | ICD-10-CM

## 2021-09-24 DIAGNOSIS — O285 Abnormal chromosomal and genetic finding on antenatal screening of mother: Secondary | ICD-10-CM

## 2021-09-24 NOTE — Addendum Note (Signed)
Addended by: Teena Dunk on: 09/24/2021 10:04 AM ? ? Modules accepted: Orders ? ?

## 2021-09-24 NOTE — Procedures (Signed)
Haley Kramer ?01/20/1986 ?[redacted]w[redacted]d ? ?Fetus A Non-Stress Test Interpretation for 09/24/21 ? ?Indication:  amniocentesis ? ?Fetal Heart Rate A ?Mode: External ?Baseline Rate (A): 135 bpm ?Variability: Moderate ?Accelerations: 10 x 10 ?Decelerations: None ?Multiple birth?: No ? ?Uterine Activity ?Mode: Toco ?Contraction Frequency (min): UI ?Contraction Quality: Mild ?Resting Tone Palpated: Relaxed ? ?Interpretation (Fetal Testing) ?Nonstress Test Interpretation: Reactive ?Overall Impression: Reassuring for gestational age ?Comments: tracing reviewed by Dr. Judeth Cornfield ? ? ?

## 2021-09-25 ENCOUNTER — Encounter: Payer: Self-pay | Admitting: Obstetrics and Gynecology

## 2021-09-25 ENCOUNTER — Ambulatory Visit (INDEPENDENT_AMBULATORY_CARE_PROVIDER_SITE_OTHER): Payer: Medicaid Other | Admitting: Clinical

## 2021-09-25 ENCOUNTER — Telehealth: Payer: Self-pay | Admitting: *Deleted

## 2021-09-25 DIAGNOSIS — Q774 Achondroplasia: Secondary | ICD-10-CM | POA: Insufficient documentation

## 2021-09-25 DIAGNOSIS — F411 Generalized anxiety disorder: Secondary | ICD-10-CM | POA: Diagnosis not present

## 2021-09-25 NOTE — Patient Instructions (Signed)
[facility] ?[address] ?[place], [place] [address] ?[phone] (main office) ?[phone] (Sanaya Gwilliam's office) ? ?[email or web address] ? ?/Emotional Producer, television/film/video and Websites ?Here are a few free apps meant to help you to help yourself.  To find, try searching on the internet to see if the app is offered on Apple/Android devices. If your first choice doesn't come up on your device, the good news is that there are many choices! Play around with different apps to see which ones are helpful to you. ? ? ? Calm ?This is an app meant to help increase calm feelings. Includes info, strategies, and tools for tracking your feelings. ?  ? ? ? Calm Harm  ?This app is meant to help with self-harm. Provides many 5-minute or 15-min coping strategies for doing instead of hurting yourself.  ?  ? ? ? Healthy Minds ?Health Minds is a problem-solving tool to help deal with emotions and cope with stress you encounter wherever you are. ?  ? ? ? MindShift ?This app can help people cope with anxiety. Rather than trying to avoid anxiety, you can make an important shift and face it. ?  ? ? ? MY3  ?MY3 features a support system, safety plan and resources with the goal of offering a tool to use in a time of need. ? ?  ? ? ? My Life My Voice  ?This mood journal offers a simple solution for tracking your thoughts, feelings and moods. Animated emoticons can help identify your mood. ? ?  ? ? ? Relax Melodies ?Designed to help with sleep, on this app you can mix sounds and meditations for relaxation. ?  ? ? ? Smiling Mind ?Smiling Mind is meditation made easy: it's a simple tool that helps put a smile on your mind. ?  ? ? ? ? ? Stop, Breathe & Think  ?A friendly, simple guide for people through meditations for mindfulness and compassion. ? ?Stop, Breathe and Think Kids ?Enter your current feelings and choose a ?mission? to help you cope. Offers videos for certain moods instead of just sound  recordings.  ?  ? ? ? Team Orange ?The goal of this tool is to help teens change how they think, act, and react. This app helps you focus on your own good feelings and experiences. ?  ? ? ? The [employer] Box ?The [employer] Box (VHB) contains simple tools to help patients with coping, relaxation, distraction, and positive thinking. ?  ? ? ? ? ?BRAINSTORMING ? ?Develop a Plan ?Goals: ?Provide a way to start conversation about your new life with a baby ?Assist parents in recognizing and using resources within their reach ?Help pave the way before birth for an easier period of transition afterwards. ? ?Make a list of the following information to keep in a central location: ?Full name of Mom and Partner: _____________________________________________ ?Baby's full name and Date of Birth: ___________________________________________ ?Home Address: ___________________________________________________________ ?________________________________________________________________________ ?Home Phone: ____________________________________________________________ ?Parents' cell numbers: _____________________________________________________ ?________________________________________________________________________ ?Name and contact info for OB: ______________________________________________ ?Name and contact info for Pediatrician:________________________________________ ?Contact info for Lactation Consultants: ________________________________________ ? ?REST and SLEEP ?*You each need at least 4-5 hours of uninterrupted sleep every day. Write specific names and contact information.* ?How are you going to rest in the postpartum period? While partner's home? When partner returns to work? When you both return to work? ?Where will your baby sleep? ?Who is available to help during the day? Evening? Night? ?Who could  move in for a period to help support you? ?What are some ideas to help you get enough  sleep? ?__________________________________________________________________________________________________________________________________________________________________________________________________________________________________________ ?NUTRITIOUS FOOD AND DRINK ?*Plan for meals before your baby is born so you can have healthy food to eat during the immediate postpartum period.* ?Who will look after breakfast? Lunch? Dinner? List names and contact information. Brainstorm quick, healthy ideas for each meal. ?What can you do before baby is born to prepare meals for the postpartum period? ?How can others help you with meals? ?Which grocery stores provide online shopping and delivery? ?Which restaurants offer take-out or delivery options? ?______________________________________________________________________________________________________________________________________________________________________________________________________________________________________________________________________________________________________________________________________________________________________________________________________ ? ?CARE FOR MOM ?*It's important that mom is cared for and pampered in the postpartum period. Remember, the most important ways new mothers need care are: sleep, nutrition, gentle exercise, and time off.* ?Who can come take care of mom during this period? Make a list of people with their contact information. ?List some activities that make you feel cared for, rested, and energized? Who can make sure you have opportunities to do these things? ?Does mom have a space of her very own within your home that's just for her? Make a ?[facility]? where she can be comfortable, rest, and renew herself  daily. ?______________________________________________________________________________________________________________________________________________________________________________________________________________________________________________________________________________________________________________________________________________________________________________________________________ ? ? ? ?CARE FOR AND FEEDING BABY ?*Knowledgeable and encouraging people will offer the best support with regard to feeding your baby.* ?Educate yourself and choose the best feeding option for your baby. ?Make a list of people who will guide, support, and be a resource for you as your care for and feed your baby. (Friends that have breastfed or are currently breastfeeding, lactation consultants, breastfeeding support groups, etc.) ?Consider a postpartum doula. (These websites can give you information: dona.org & https://shea.org/) ?Seek out local breastfeeding resources like the breastfeeding support group at [facility] or [employer]. ?______________________________________________________________________________________________________________________________________________________________________________________________________________________________________________________________________________________________________________________________________________________________________________________________________ ? ?CHORES AND ERRANDS ?Who can help with a thorough cleaning before baby is born? ?Make a list of people who will help with housekeeping and chores, like laundry, light cleaning, dishes, bathrooms, etc. ?Who can run some errands for you? ?What can you do to make sure you are stocked with basic supplies before baby is born? ?Who is going to do the  shopping? ?______________________________________________________________________________________________________________________________________________________________________________________________________________________________________________________________________________________________________________________________________________________________________________________________________ ? ? ? ? ?Family Adjustment ?*Nurture yourselves?it helps parents be more loving and allows for better bonding with their child.* ?What sorts of things do you and partner enjoy doing together? Which activities help you

## 2021-09-26 ENCOUNTER — Ambulatory Visit: Payer: Medicaid Other

## 2021-09-26 NOTE — BH Specialist Note (Signed)
Pt did not arrive to video visit and did not answer the phone; Left HIPPA-compliant message to call back October Peery from [facility] at  [phone] (Jamicia Haaland's office).  ?; left MyChart message for patient.  ? ?

## 2021-09-30 ENCOUNTER — Encounter: Payer: Medicaid Other | Admitting: Obstetrics & Gynecology

## 2021-09-30 ENCOUNTER — Ambulatory Visit (INDEPENDENT_AMBULATORY_CARE_PROVIDER_SITE_OTHER): Payer: Medicaid Other | Admitting: Obstetrics and Gynecology

## 2021-09-30 VITALS — BP 101/63 | HR 91 | Wt 217.3 lb

## 2021-09-30 DIAGNOSIS — O0990 Supervision of high risk pregnancy, unspecified, unspecified trimester: Secondary | ICD-10-CM

## 2021-09-30 DIAGNOSIS — I10 Essential (primary) hypertension: Secondary | ICD-10-CM

## 2021-09-30 DIAGNOSIS — Z3A31 31 weeks gestation of pregnancy: Secondary | ICD-10-CM

## 2021-09-30 DIAGNOSIS — O09523 Supervision of elderly multigravida, third trimester: Secondary | ICD-10-CM

## 2021-09-30 DIAGNOSIS — Z98891 History of uterine scar from previous surgery: Secondary | ICD-10-CM

## 2021-09-30 DIAGNOSIS — Z6791 Unspecified blood type, Rh negative: Secondary | ICD-10-CM

## 2021-09-30 DIAGNOSIS — Q774 Achondroplasia: Secondary | ICD-10-CM

## 2021-09-30 DIAGNOSIS — O26893 Other specified pregnancy related conditions, third trimester: Secondary | ICD-10-CM

## 2021-09-30 NOTE — Progress Notes (Signed)
   PRENATAL VISIT NOTE  Subjective:  Haley Kramer is a 36 y.o. G3P1011 at [redacted]w[redacted]d being seen today for ongoing prenatal care.  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  Patient doing well with no acute concerns today. She reports no complaints.  Contractions: Not present. Vag. Bleeding: None.  Movement: Present. Denies leaking of fluid.   The following portions of the patient's history were reviewed and updated as appropriate: allergies, current medications, past family history, past medical history, past social history, past surgical history and problem list. Problem list updated.  Objective:   Vitals:   09/30/21 1422  BP: 101/63  Pulse: 91  Weight: 217 lb 4.8 oz (98.6 kg)    Fetal Status: Fetal Heart Rate (bpm): 156 Fundal Height: 32 cm Movement: Present     General:  Alert, oriented and cooperative. Patient is in no acute distress.  Skin: Skin is warm and dry. No rash noted.   Cardiovascular: Normal heart rate noted  Respiratory: Normal respiratory effort, no problems with respiration noted  Abdomen: Soft, gravid, appropriate for gestational age.  Pain/Pressure: Present     Pelvic: Cervical exam deferred        Extremities: Normal range of motion.  Edema: Trace  Mental Status:  Normal mood and affect. Normal behavior. Normal judgment and thought content.   Assessment and Plan:  Pregnancy: G3P1011 at [redacted]w[redacted]d  1. [redacted] weeks gestation of pregnancy   2. 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 Supervision of high  risk pregnancy, antepartum Continue routine care otherwise  5. Rh negative status during pregnancy in third trimester Pt is s/p rhogam  6. History of C-section Pt desires TOLAC, consent previously signed  7. Multigravida of advanced maternal age in third trimester Scans and follow up per MFM  Preterm labor symptoms and general obstetric precautions including but not limited to vaginal bleeding, contractions, leaking of fluid and fetal movement were reviewed in detail with the patient.  Please refer to After Visit Summary for other counseling recommendations.   Return in about 2 weeks (around 10/14/2021) for [facility], in person.   Lynnda Shields, MD Faculty Attending Center for [facility]

## 2021-10-04 ENCOUNTER — Telehealth: Payer: Self-pay | Admitting: Genetics

## 2021-10-04 LAB — CHROMOSOME, AMNIOTIC FLUID
Cells Analyzed: 15
Cells Counted: 15
Cells Karyotyped: 2
Colonies: 15
GTG Band Resolution Achieved: 450

## 2021-10-04 NOTE — Telephone Encounter (Signed)
Called Haley Kramer to discuss partial amniocentesis results. Amniotic fluid collected on 09/24/21 was sent to [facility] for fetal karyotype and microarray testing simultaneously. Amniotic fluid was forwarded from [facility] to [facility] for a prenatal skeletal dysplasia panel. The fetal karyotype is normal (46,XY). The fetal microarray and prenatal skeletal dysplasia panel are pending. All questions answered.

## 2021-10-09 ENCOUNTER — Other Ambulatory Visit: Payer: Self-pay

## 2021-10-09 ENCOUNTER — Telehealth: Payer: Self-pay | Admitting: Genetics

## 2021-10-09 ENCOUNTER — Ambulatory Visit: Payer: Medicaid Other | Admitting: Clinical

## 2021-10-09 DIAGNOSIS — Z91199 Patient's noncompliance with other medical treatment and regimen due to unspecified reason: Secondary | ICD-10-CM

## 2021-10-09 LAB — DIRECT PRENATAL SNP CMA

## 2021-10-09 LAB — MATERNAL CELL CONTAMINATION

## 2021-10-09 NOTE — Telephone Encounter (Signed)
Spoke with Haley Kramer to return [facility] skeletal dysplasia genetic testing results. The [facility] skeletal dysplasia panel is negative. No pathogenic, likely pathogenic, or variants of uncertain significance were identified by the analysis on the submitted amniotic fluid. The genes tested include: AGPS, ALPL, ARSE, BMP1, CEP120, COL11A1, COL11A2, COL1A1, COL1A2, COL2A1, COMP, CRTAP, DLL3, DYNC2H1, EBP, EVC, EVC2, FGFR1, FGFR2, FGFR3, FKBP10, FLNA, FLNB, GNPAT, HSPG2, IFITM5, IFT172, INPPL1, KIAA0586, LBR, LEPRE1, LIFR, NEK1, PEX7, PLOD2, POR, PPIB, RUNX2, SERPINH1, SLC26A2, SLC35D1, SOX9, TMEM38B, TRIP11, TRPV4, TTC21B, WDR34, and WDR35. We discussed that this negative result does not exclude a genetic basis for the pregnancy's clinical features visualized on ultrasound. It is possible that the pregnancy has a pathogenic variant that was not detectable by the analysis or has a pathogenic variant in a gene not included on the panel. We reviewed with Haley Kramer that her baby should have a consultation with pediatric genetics after birth for a physical evaluation and to discuss further genetic testing if recommended by the pediatric genetics team. All questions answered. No further genetic tests are pending in pregnancy.

## 2021-10-09 NOTE — Telephone Encounter (Signed)
Returned normal microarray result to [facility]. The skeletal dysplasia panel is pending through [facility]. All questions answered.

## 2021-10-10 ENCOUNTER — Encounter: Payer: Self-pay | Admitting: *Deleted

## 2021-10-10 ENCOUNTER — Ambulatory Visit: Payer: Medicaid Other | Admitting: *Deleted

## 2021-10-10 ENCOUNTER — Ambulatory Visit: Payer: Medicaid Other | Attending: Obstetrics and Gynecology

## 2021-10-10 VITALS — BP 123/65 | HR 76

## 2021-10-10 DIAGNOSIS — O99213 Obesity complicating pregnancy, third trimester: Secondary | ICD-10-CM | POA: Diagnosis present

## 2021-10-10 DIAGNOSIS — O359XX0 Maternal care for (suspected) fetal abnormality and damage, unspecified, not applicable or unspecified: Secondary | ICD-10-CM

## 2021-10-10 DIAGNOSIS — O360130 Maternal care for anti-D [Rh] antibodies, third trimester, not applicable or unspecified: Secondary | ICD-10-CM | POA: Diagnosis not present

## 2021-10-10 DIAGNOSIS — O403XX0 Polyhydramnios, third trimester, not applicable or unspecified: Secondary | ICD-10-CM | POA: Diagnosis not present

## 2021-10-10 DIAGNOSIS — O34219 Maternal care for unspecified type scar from previous cesarean delivery: Secondary | ICD-10-CM

## 2021-10-10 DIAGNOSIS — E669 Obesity, unspecified: Secondary | ICD-10-CM

## 2021-10-10 DIAGNOSIS — O0990 Supervision of high risk pregnancy, unspecified, unspecified trimester: Secondary | ICD-10-CM | POA: Insufficient documentation

## 2021-10-10 DIAGNOSIS — Z3A32 32 weeks gestation of pregnancy: Secondary | ICD-10-CM

## 2021-10-10 DIAGNOSIS — Z148 Genetic carrier of other disease: Secondary | ICD-10-CM

## 2021-10-10 DIAGNOSIS — O09523 Supervision of elderly multigravida, third trimester: Secondary | ICD-10-CM | POA: Diagnosis not present

## 2021-10-10 DIAGNOSIS — O09293 Supervision of pregnancy with other poor reproductive or obstetric history, third trimester: Secondary | ICD-10-CM

## 2021-10-10 DIAGNOSIS — O09513 Supervision of elderly primigravida, third trimester: Secondary | ICD-10-CM | POA: Diagnosis present

## 2021-10-10 DIAGNOSIS — O99843 Bariatric surgery status complicating pregnancy, third trimester: Secondary | ICD-10-CM

## 2021-10-15 ENCOUNTER — Ambulatory Visit: Payer: Medicaid Other | Admitting: *Deleted

## 2021-10-15 ENCOUNTER — Other Ambulatory Visit: Payer: Self-pay | Admitting: Obstetrics and Gynecology

## 2021-10-15 ENCOUNTER — Ambulatory Visit (HOSPITAL_BASED_OUTPATIENT_CLINIC_OR_DEPARTMENT_OTHER): Payer: Medicaid Other | Admitting: *Deleted

## 2021-10-15 ENCOUNTER — Other Ambulatory Visit: Payer: Self-pay | Admitting: *Deleted

## 2021-10-15 ENCOUNTER — Ambulatory Visit: Payer: Medicaid Other | Attending: Obstetrics and Gynecology

## 2021-10-15 VITALS — BP 118/64 | HR 81

## 2021-10-15 DIAGNOSIS — O09523 Supervision of elderly multigravida, third trimester: Secondary | ICD-10-CM

## 2021-10-15 DIAGNOSIS — O35HXX0 Maternal care for other (suspected) fetal abnormality and damage, fetal lower extremities anomalies, not applicable or unspecified: Secondary | ICD-10-CM | POA: Diagnosis not present

## 2021-10-15 DIAGNOSIS — O0990 Supervision of high risk pregnancy, unspecified, unspecified trimester: Secondary | ICD-10-CM

## 2021-10-15 DIAGNOSIS — O34219 Maternal care for unspecified type scar from previous cesarean delivery: Secondary | ICD-10-CM

## 2021-10-15 DIAGNOSIS — O99843 Bariatric surgery status complicating pregnancy, third trimester: Secondary | ICD-10-CM

## 2021-10-15 DIAGNOSIS — O403XX0 Polyhydramnios, third trimester, not applicable or unspecified: Secondary | ICD-10-CM

## 2021-10-15 DIAGNOSIS — O09513 Supervision of elderly primigravida, third trimester: Secondary | ICD-10-CM

## 2021-10-15 DIAGNOSIS — O283 Abnormal ultrasonic finding on antenatal screening of mother: Secondary | ICD-10-CM | POA: Insufficient documentation

## 2021-10-15 DIAGNOSIS — O99213 Obesity complicating pregnancy, third trimester: Secondary | ICD-10-CM

## 2021-10-15 DIAGNOSIS — O360130 Maternal care for anti-D [Rh] antibodies, third trimester, not applicable or unspecified: Secondary | ICD-10-CM

## 2021-10-15 DIAGNOSIS — E669 Obesity, unspecified: Secondary | ICD-10-CM

## 2021-10-15 DIAGNOSIS — Z3A33 33 weeks gestation of pregnancy: Secondary | ICD-10-CM

## 2021-10-15 DIAGNOSIS — O10913 Unspecified pre-existing hypertension complicating pregnancy, third trimester: Secondary | ICD-10-CM

## 2021-10-15 NOTE — Procedures (Signed)
Haley Kramer [birth year] [redacted]w[redacted]d  Fetus A Non-Stress Test Interpretation for 10/15/21  Indication: Unsatisfactory BPP  Fetal Heart Rate A Mode: External Baseline Rate (A): 140 bpm Variability: Moderate Accelerations: 15 x 15 Decelerations: None Multiple birth?: No  Uterine Activity Mode: Palpation, Toco Contraction Frequency (min): none Resting Tone Palpated: Relaxed  Interpretation (Fetal Testing) Nonstress Test Interpretation: Reactive Overall Impression: Reassuring for gestational age Comments: Dr. Grace Bushy reviewed tracing

## 2021-10-18 ENCOUNTER — Encounter: Payer: Self-pay | Admitting: Oncology

## 2021-10-22 ENCOUNTER — Ambulatory Visit (INDEPENDENT_AMBULATORY_CARE_PROVIDER_SITE_OTHER): Payer: Medicaid Other | Admitting: Family Medicine

## 2021-10-22 ENCOUNTER — Encounter: Payer: Self-pay | Admitting: Family Medicine

## 2021-10-22 VITALS — BP 123/75 | HR 86 | Wt 234.0 lb

## 2021-10-22 DIAGNOSIS — O99019 Anemia complicating pregnancy, unspecified trimester: Secondary | ICD-10-CM

## 2021-10-22 DIAGNOSIS — Z98891 History of uterine scar from previous surgery: Secondary | ICD-10-CM

## 2021-10-22 DIAGNOSIS — Z6791 Unspecified blood type, Rh negative: Secondary | ICD-10-CM

## 2021-10-22 DIAGNOSIS — F3289 Other specified depressive episodes: Secondary | ICD-10-CM

## 2021-10-22 DIAGNOSIS — I10 Essential (primary) hypertension: Secondary | ICD-10-CM

## 2021-10-22 DIAGNOSIS — O09523 Supervision of elderly multigravida, third trimester: Secondary | ICD-10-CM

## 2021-10-22 DIAGNOSIS — Z9884 Bariatric surgery status: Secondary | ICD-10-CM | POA: Insufficient documentation

## 2021-10-22 DIAGNOSIS — Q774 Achondroplasia: Secondary | ICD-10-CM

## 2021-10-22 DIAGNOSIS — O26893 Other specified pregnancy related conditions, third trimester: Secondary | ICD-10-CM

## 2021-10-22 DIAGNOSIS — O0990 Supervision of high risk pregnancy, unspecified, unspecified trimester: Secondary | ICD-10-CM

## 2021-10-22 NOTE — Progress Notes (Signed)
   Subjective:  Haley Kramer is a 36 y.o. G3P1011 at [redacted]w[redacted]d being seen today for ongoing prenatal care.  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  Patient reports no complaints.  Contractions: Not present.  .  Movement: Present. Denies leaking of fluid.   The following portions of the patient's history were reviewed and updated as appropriate: allergies, current medications, past family history, past medical history, past social history, past surgical history and problem list. Problem list updated.  Objective:   Vitals:   10/22/21 1107  BP: 123/75  Pulse: 86  Weight: 234 lb (106.1 kg)    Fetal Status: Fetal Heart Rate (bpm): 139   Movement: Present     General:  Alert, oriented and cooperative. Patient is in no acute distress.  Skin: Skin is warm and dry. No rash noted.   Cardiovascular: Normal heart rate noted  Respiratory: Normal respiratory effort, no problems with respiration noted  Abdomen: Soft, gravid, appropriate for gestational age. Pain/Pressure: Absent     Pelvic:       Cervical exam deferred        Extremities: Normal range of motion.     Mental Status: Normal mood and affect. Normal behavior. Normal judgment and thought content.   Urinalysis:      Assessment and Plan:  Pregnancy: G3P1011 at [redacted]w[redacted]d  1. Supervision of high risk pregnancy, antepartum BP and FHR normal  2. Achondroplasia syndrome Concern based on [place], amniocentesis studies returned negative for any known mutations Following w MFM  3. Antepartum anemia Lab Results  Component Value Date   HGB 10.9 (L)  09/16/2021  resolved  4. Multigravida of advanced maternal age in third trimester   5. History of C-section TOLAC consent signed 09/16/2021  6. Rh negative status during pregnancy in third trimester Received rhogam 09/16/2021  7. Benign hypertension Noted in chart, normotensive throughout pregnancy Not on ASA due to hx of bariatric surgery  8. Other depression Not addressed at this visit  Preterm labor symptoms and general obstetric precautions including but not limited to vaginal bleeding, contractions, leaking of fluid and fetal movement were reviewed in detail with the patient. Please refer to After Visit Summary for other counseling recommendations.  Return in 2 weeks (on 11/05/2021) for [facility], ob visit.   Clarnce Flock, MD

## 2021-10-22 NOTE — Patient Instructions (Signed)

## 2021-10-23 ENCOUNTER — Other Ambulatory Visit: Payer: Self-pay | Admitting: Obstetrics and Gynecology

## 2021-10-23 ENCOUNTER — Ambulatory Visit (HOSPITAL_BASED_OUTPATIENT_CLINIC_OR_DEPARTMENT_OTHER): Payer: Medicaid Other | Admitting: *Deleted

## 2021-10-23 ENCOUNTER — Ambulatory Visit: Payer: Medicaid Other | Admitting: *Deleted

## 2021-10-23 ENCOUNTER — Ambulatory Visit: Payer: Medicaid Other | Attending: Obstetrics and Gynecology

## 2021-10-23 VITALS — BP 115/66 | HR 72

## 2021-10-23 DIAGNOSIS — E669 Obesity, unspecified: Secondary | ICD-10-CM

## 2021-10-23 DIAGNOSIS — O34219 Maternal care for unspecified type scar from previous cesarean delivery: Secondary | ICD-10-CM | POA: Diagnosis not present

## 2021-10-23 DIAGNOSIS — O99213 Obesity complicating pregnancy, third trimester: Secondary | ICD-10-CM

## 2021-10-23 DIAGNOSIS — Z6833 Body mass index (BMI) 33.0-33.9, adult: Secondary | ICD-10-CM

## 2021-10-23 DIAGNOSIS — O0990 Supervision of high risk pregnancy, unspecified, unspecified trimester: Secondary | ICD-10-CM

## 2021-10-23 DIAGNOSIS — Z3A34 34 weeks gestation of pregnancy: Secondary | ICD-10-CM

## 2021-10-23 DIAGNOSIS — O99843 Bariatric surgery status complicating pregnancy, third trimester: Secondary | ICD-10-CM

## 2021-10-23 DIAGNOSIS — O09523 Supervision of elderly multigravida, third trimester: Secondary | ICD-10-CM | POA: Diagnosis not present

## 2021-10-23 DIAGNOSIS — O283 Abnormal ultrasonic finding on antenatal screening of mother: Secondary | ICD-10-CM | POA: Diagnosis not present

## 2021-10-23 DIAGNOSIS — O3623X0 Maternal care for hydrops fetalis, third trimester, not applicable or unspecified: Secondary | ICD-10-CM

## 2021-10-23 DIAGNOSIS — O403XX0 Polyhydramnios, third trimester, not applicable or unspecified: Secondary | ICD-10-CM

## 2021-10-23 DIAGNOSIS — O09293 Supervision of pregnancy with other poor reproductive or obstetric history, third trimester: Secondary | ICD-10-CM

## 2021-10-23 NOTE — Procedures (Cosign Needed)
Haley Kramer [birth year] [redacted]w[redacted]d  Fetus A Non-Stress Test Interpretation for 10/23/21  Indication: Unsatisfactory BPP  Fetal Heart Rate A Mode: External Baseline Rate (A): 130 bpm Variability: Moderate Accelerations: 15 x 15 Decelerations: None Multiple birth?: No  Uterine Activity Mode: Palpation, Toco Contraction Frequency (min): ui Resting Tone Palpated: Relaxed  Interpretation (Fetal Testing) Nonstress Test Interpretation: Reactive Overall Impression: Reassuring for gestational age Comments: Dr. Grace Bushy reviewed tracing

## 2021-10-25 ENCOUNTER — Telehealth: Payer: Medicaid Other | Admitting: Physician Assistant

## 2021-10-25 DIAGNOSIS — O0993 Supervision of high risk pregnancy, unspecified, third trimester: Secondary | ICD-10-CM

## 2021-10-25 DIAGNOSIS — R609 Edema, unspecified: Secondary | ICD-10-CM

## 2021-10-25 NOTE — Progress Notes (Signed)
Virtual Visit Consent   Haley Kramer, you are scheduled for a virtual visit with a [facility] provider today. Just as with appointments in the office, your consent must be obtained to participate. Your consent will be active for this visit and any virtual visit you may have with one of our providers in the next 365 days. If you have a MyChart account, a copy of this consent can be sent to you electronically.  As this is a virtual visit, video technology does not allow for your provider to perform a traditional examination. This may limit your provider's ability to fully assess your condition. If your provider identifies any concerns that need to be evaluated in person or the need to arrange testing (such as labs, EKG, etc.), we will make arrangements to do so. Although advances in technology are sophisticated, we cannot ensure that it will always work on either your end or our end. If the connection with a video visit is poor, the visit may have to be switched to a telephone visit. With either a video or telephone visit, we are not always able to ensure that we have a secure connection.  By engaging in this virtual visit, you consent to the provision of healthcare and authorize for your insurance to be billed (if applicable) for the services provided during this visit. Depending on your insurance coverage, you may receive a charge related to this service.  I need to obtain your verbal consent now. Are you willing to proceed with your visit today? Haley Kramer has provided verbal consent on 10/25/2021 for a virtual visit (video or telephone). Margaretann Loveless, PA-C  Date: 10/25/2021 8:51 AM  Virtual Visit via Video Note   I, Margaretann Loveless, connected with  Haley Kramer  ([id number], 07/28/1985) on 10/25/21 at  8:45 AM EDT by a video-enabled telemedicine application and verified that I am speaking with the correct person using two identifiers.  Location: Patient: Virtual Visit  Location Patient: Home Provider: Virtual Visit Location Provider: Home Office   I discussed the limitations of evaluation and management by telemedicine and the availability of in person appointments. The patient expressed understanding and agreed to proceed.    History of Present Illness: Haley Kramer is a 36 y.o. who identifies as a female who was assigned female at birth, and is being seen today for swelling in feet, ankles and legs. Reports it is mostly on one side. Does have diuretics, but did not want to take without asking. Does report it is hurting her.  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   Indication for care/intervention related to labor/delivery, antepartum 08/25/2021   AMA (advanced maternal age) multigravida 35+ 08/13/2021   History of C-section 08/13/2021   Rh negative status during pregnancy 08/13/2021   Anemia in pregnancy 05/15/2021   Vitamin D deficiency 05/15/2021   Supervision of high risk pregnancy, antepartum 04/23/2021   Depression 04/23/2021   Benign hypertension 06/13/2020    Allergies: No Known Allergies Medications:  Current Outpatient Medications:    acetaminophen (TYLENOL) 500 MG tablet, Take 500 mg by mouth every 8 (eight) hours as needed for moderate pain., Disp: , Rfl:    Cholecalciferol (HM VITAMIN D3) 100 MCG (4000 UT) CAPS, Take 1 capsule (4,000 Units total) by mouth daily., Disp: 90 capsule, Rfl: 2   Cholecalciferol (VITAMIN D3) 50 MCG (2000 UT) capsule, Take 4,000  Units by mouth daily., Disp: , Rfl:    doxylamine, Sleep, (UNISOM) 25 MG tablet, Take 50 mg by mouth at bedtime as needed., Disp: , Rfl:    Doxylamine-Pyridoxine (DICLEGIS) 10-10 MG TBEC, Take 2 tablets by mouth at bedtime. If symptoms persist, add one tablet in the morning and one in the afternoon, Disp: 100 tablet, Rfl: 5   famotidine (PEPCID) 20 MG tablet, Take 1 tablet (20 mg  total) by mouth 2 (two) times daily., Disp: 60 tablet, Rfl: 2   folic acid (FOLVITE) 800 MCG tablet, Take 400 mcg by mouth daily., Disp: , Rfl:    ondansetron (ZOFRAN-ODT) 4 MG disintegrating tablet, Take 1 tablet (4 mg total) by mouth every 6 (six) hours as needed for nausea., Disp: 120 tablet, Rfl: 3   Prenatal Multivit-Min-Fe-FA (PRE-NATAL PO), Take by mouth., Disp: , Rfl:   Observations/Objective: Patient is well-developed, well-nourished in no acute distress.  Resting comfortably at home.  Head is normocephalic, atraumatic.  No labored breathing.  Speech is clear and coherent with logical content.  Patient is alert and oriented at baseline.    Assessment and Plan: 1. Swelling  2. High-risk pregnancy in third trimester  - Advised for patient to be seen in person at [facility] or local ER if not close - Concerns for possible DVT and cannot be evaluated appropriately via video - She agrees to proceed for further evaluation  Follow Up Instructions: I discussed the assessment and treatment plan with the patient. The patient was provided an opportunity to ask questions and all were answered. The patient agreed with the plan and demonstrated an understanding of the instructions.  A copy of instructions were sent to the patient via MyChart unless otherwise noted below.    The patient was advised to call back or seek an in-person evaluation if the symptoms worsen or if the condition fails to improve as anticipated.  Time:  I spent 7 minutes with the patient via telehealth technology discussing the above problems/concerns.    Margaretann Loveless, PA-C

## 2021-10-25 NOTE — Patient Instructions (Signed)
Haley Kramer, thank you for joining Margaretann Loveless, PA-C for today's virtual visit.  While this provider is not your primary care provider (PCP), if your PCP is located in our provider database this encounter information will be shared with them immediately following your visit.  Consent: (Patient) Haley Kramer provided verbal consent for this virtual visit at the beginning of the encounter.  Current Medications:  Current Outpatient Medications:    acetaminophen (TYLENOL) 500 MG tablet, Take 500 mg by mouth every 8 (eight) hours as needed for moderate pain., Disp: , Rfl:    Cholecalciferol (HM VITAMIN D3) 100 MCG (4000 UT) CAPS, Take 1 capsule (4,000 Units total) by mouth daily., Disp: 90 capsule, Rfl: 2   Cholecalciferol (VITAMIN D3) 50 MCG (2000 UT) capsule, Take 4,000 Units by mouth daily., Disp: , Rfl:    doxylamine, Sleep, (UNISOM) 25 MG tablet, Take 50 mg by mouth at bedtime as needed., Disp: , Rfl:    Doxylamine-Pyridoxine (DICLEGIS) 10-10 MG TBEC, Take 2 tablets by mouth at bedtime. If symptoms persist, add one tablet in the morning and one in the afternoon, Disp: 100 tablet, Rfl: 5   famotidine (PEPCID) 20 MG tablet, Take 1 tablet (20 mg total) by mouth 2 (two) times daily., Disp: 60 tablet, Rfl: 2   folic acid (FOLVITE) 800 MCG tablet, Take 400 mcg by mouth daily., Disp: , Rfl:    ondansetron (ZOFRAN-ODT) 4 MG disintegrating tablet, Take 1 tablet (4 mg total) by mouth every 6 (six) hours as needed for nausea., Disp: 120 tablet, Rfl: 3   Prenatal Multivit-Min-Fe-FA (PRE-NATAL PO), Take by mouth., Disp: , Rfl:    Medications ordered in this encounter:  No orders of the defined types were placed in this encounter.    *If you need refills on other medications prior to your next appointment, please contact your pharmacy*  Follow-Up: Call back or seek an in-person evaluation if the symptoms worsen or if the condition fails to improve as anticipated.  Other  Instructions For the safety of you and your child, I recommend a face to face office visit with a health care provider.  If you are having a true medical emergency please call 911.    For an urgent face to face visit, [facility] has six urgent care centers for your convenience:     [facility] at [facility] Directions [phone] [address] [place], [place] [address]    [facility] Health Ur[facility] Center [facility]) Get Driving Directions [phone] [address] [place], [place] [address]  [facility] Health Urgent [facility] Center [facility] [place]) Get Driving Directions [phone] [address] [place],  [place]  [address]  [facility] Health Urgent Care at [facility] Get Driving Directions [phone] [address] [place], [place] [address]   [facility] Health Urgent Care at [facility] Get Driving Directions  [phone] [address] [place], [place] [address]   [facility] Health Urgent Care at [facility] Directions [phone] [address], [address] [place], [place] [address]    If you have been instructed to have an in-person evaluation today at a local Urgent Care facility, please use the link below. It will take you to a list of all of our available [facility], including address, phone number and hours of operation. Please do not delay care.  [facility]  If you or a family member do not have a  primary care provider, use the link below to schedule a visit and establish care. When you choose a [facility] primary care physician or advanced practice provider, you gain a long-term partner in health. Find a Primary Care Provider  Learn more about [facility] in-office and virtual care options: [facility] - Get Care Now

## 2021-10-28 ENCOUNTER — Encounter: Payer: Self-pay | Admitting: Oncology

## 2021-10-30 ENCOUNTER — Ambulatory Visit: Payer: Medicaid Other | Attending: Maternal & Fetal Medicine | Admitting: *Deleted

## 2021-10-30 ENCOUNTER — Ambulatory Visit: Payer: Medicaid Other | Admitting: *Deleted

## 2021-10-30 VITALS — BP 124/69 | HR 82

## 2021-10-30 DIAGNOSIS — O09523 Supervision of elderly multigravida, third trimester: Secondary | ICD-10-CM | POA: Diagnosis not present

## 2021-10-30 DIAGNOSIS — Z3A35 35 weeks gestation of pregnancy: Secondary | ICD-10-CM | POA: Insufficient documentation

## 2021-10-30 DIAGNOSIS — O34219 Maternal care for unspecified type scar from previous cesarean delivery: Secondary | ICD-10-CM | POA: Insufficient documentation

## 2021-10-30 DIAGNOSIS — O99213 Obesity complicating pregnancy, third trimester: Secondary | ICD-10-CM | POA: Insufficient documentation

## 2021-10-30 DIAGNOSIS — O403XX0 Polyhydramnios, third trimester, not applicable or unspecified: Secondary | ICD-10-CM | POA: Diagnosis present

## 2021-10-30 DIAGNOSIS — O0990 Supervision of high risk pregnancy, unspecified, unspecified trimester: Secondary | ICD-10-CM

## 2021-10-30 NOTE — Procedures (Signed)
Haley Kramer 04-26-1986 [redacted]w[redacted]d  Fetus A Non-Stress Test Interpretation for 10/30/21  Indication: Polyhydramnios  Fetal Heart Rate A Mode: External Baseline Rate (A): 135 bpm Variability: Moderate Accelerations: 15 x 15 Decelerations: None Multiple birth?: No  Uterine Activity Mode: Toco Contraction Frequency (min): 2-15 with UI Contraction Duration (sec): 50-75 Contraction Quality: Mild (pt reports a mild intermittent cramp in her low back) Resting Tone Palpated: Relaxed  Interpretation (Fetal Testing) Nonstress Test Interpretation: Reactive Overall Impression: Reassuring for gestational age Comments: tracing reviewed by Dr. Parke Poisson

## 2021-10-31 ENCOUNTER — Observation Stay
Admission: EM | Admit: 2021-10-31 | Discharge: 2021-10-31 | Disposition: A | Discharge status: Home / Self Care | Payer: Medicaid Other | Attending: Licensed Practical Nurse | Admitting: Licensed Practical Nurse

## 2021-10-31 DIAGNOSIS — Z79899 Other long term (current) drug therapy: Secondary | ICD-10-CM | POA: Insufficient documentation

## 2021-10-31 DIAGNOSIS — O4193X0 Disorder of amniotic fluid and membranes, unspecified, third trimester, not applicable or unspecified: Secondary | ICD-10-CM | POA: Diagnosis present

## 2021-10-31 DIAGNOSIS — O1203 Gestational edema, third trimester: Secondary | ICD-10-CM | POA: Diagnosis not present

## 2021-10-31 DIAGNOSIS — R109 Unspecified abdominal pain: Secondary | ICD-10-CM

## 2021-10-31 DIAGNOSIS — O09523 Supervision of elderly multigravida, third trimester: Secondary | ICD-10-CM | POA: Diagnosis not present

## 2021-10-31 DIAGNOSIS — O26893 Other specified pregnancy related conditions, third trimester: Secondary | ICD-10-CM | POA: Diagnosis not present

## 2021-10-31 DIAGNOSIS — Z98891 History of uterine scar from previous surgery: Secondary | ICD-10-CM | POA: Insufficient documentation

## 2021-10-31 DIAGNOSIS — Z9884 Bariatric surgery status: Secondary | ICD-10-CM | POA: Insufficient documentation

## 2021-10-31 DIAGNOSIS — O403XX0 Polyhydramnios, third trimester, not applicable or unspecified: Secondary | ICD-10-CM | POA: Diagnosis not present

## 2021-10-31 DIAGNOSIS — Z3A35 35 weeks gestation of pregnancy: Secondary | ICD-10-CM | POA: Diagnosis not present

## 2021-10-31 DIAGNOSIS — O4703 False labor before 37 completed weeks of gestation, third trimester: Principal | ICD-10-CM | POA: Insufficient documentation

## 2021-10-31 DIAGNOSIS — O479 False labor, unspecified: Secondary | ICD-10-CM | POA: Diagnosis present

## 2021-10-31 LAB — RUPTURE OF MEMBRANE (ROM)PLUS: Rom Plus: NEGATIVE

## 2021-10-31 NOTE — Discharge Summary (Cosign Needed)
See final progress note Carie Caddy, CNM  [facility]  @TODAY @  9:35 PM

## 2021-10-31 NOTE — Final Progress Note (Cosign Needed Addendum)
Physician Final Progress Note  Patient ID: Haley Kramer MRN: [id number] DOB/AGE: 1985/12/18 36 y.o.  Admit date: 10/31/2021 Admitting provider: Linzie Collin, MD Discharge date: 10/31/2021   Admission Diagnoses:  1) intrauterine pregnancy at [redacted]w[redacted]d  2) leaking fluid   Discharge Diagnoses:  Principal Problem:   Uterine contractions  Membranes intact   History of Present Illness: The patient is a 36 y.o. female G3P1011 at [redacted]w[redacted]d who presents for evaluation of leaking fluid. She first noticed around 3 am when she got up to use the bathroom that she was leaking fluid.  She noticed throughout the day that she felt wet.  This feeling sometimes happened prior to needing to urinate or after using the bathroom. She wears a pad all of the time since having bariatric surgery.  She has not noticed anything on her pad.  She endorses + FM.  Has felt mild contractions once or twice  in an hour, states "nothing painful or regular".   (Pt reported Depression at new OB and at most recent ROB, today states she has been struggling with her mood, has started seeing a Therapist, reviewed use of Zoloft in the third trimester and lactation.  Pt also expressed desire to give birth at [facility]. Expresses interest in an induction d/t polyhydramnios  and worsening mood-discussed recommendations for induction are based on many factors and in her case, it will depend on the fetal testing that she is having regularly)  Haley Kramer's pregnancy has been complicated by 1) Previous LTCS 2) Bariatric surgery  3) Iron deficiency anemia 4) Polyhydramnios 5) Depression  6) AMA    Past Medical History:  Diagnosis Date   IDA (iron deficiency anemia)    Incomplete abortion 01/14/2021   Iron deficiency anemia, unspecified iron deficiency anemia type    Obesity    PONV (postoperative nausea and vomiting)    Vitamin D deficiency     Past Surgical History:  Procedure Laterality Date   CESAREAN SECTION  03/23/2011    DILATION AND EVACUATION N/A 01/22/2021   Procedure: DILATATION AND EVACUATION;  Surgeon: Haley Milch, MD;  Location: [facility] ORS;  Service: Gynecology;  Laterality: N/A;   ESOPHAGOGASTRODUODENOSCOPY  2016   GASTRIC BYPASS  2016    No current facility-administered medications on file prior to encounter.   Current Outpatient Medications on File Prior to Encounter  Medication Sig Dispense Refill   acetaminophen (TYLENOL) 500 MG tablet Take 500 mg by mouth every 8 (eight) hours as needed for moderate pain.     Cholecalciferol (HM VITAMIN D3) 100 MCG (4000 UT) CAPS Take 1 capsule (4,000 Units total) by mouth daily. 90 capsule 2   Cholecalciferol (VITAMIN D3) 50 MCG (2000 UT) capsule Take 4,000 Units by mouth daily.     doxylamine, Sleep, (UNISOM) 25 MG tablet Take 50 mg by mouth at bedtime as needed.     Doxylamine-Pyridoxine (DICLEGIS) 10-10 MG TBEC Take 2 tablets by mouth at bedtime. If symptoms persist, add one tablet in the morning and one in the afternoon 100 tablet 5   famotidine (PEPCID) 20 MG tablet Take 1 tablet (20 mg total) by mouth 2 (two) times daily. (Patient not taking: Reported on 10/31/2021) 60 tablet 2   folic acid (FOLVITE) 800 MCG tablet Take 400 mcg by mouth daily.     ondansetron (ZOFRAN-ODT) 4 MG disintegrating tablet Take 1 tablet (4 mg total) by mouth every 6 (six) hours as needed for nausea. (Patient not taking: Reported on 10/31/2021) 120 tablet 3  Prenatal Multivit-Min-Fe-FA (PRE-NATAL PO) Take by mouth.      No Active Allergies  Social History   Socioeconomic History   Marital status: Single    Spouse name: Haley Kramer   Number of children: 1   Years of education: Not on file   Highest education level: Not on file  Occupational History   Not on file  Tobacco Use   Smoking status: Never   Smokeless tobacco: Never  Vaping Use   Vaping Use: Never used  Substance and Sexual Activity   Alcohol use: No   Drug use: Never   Sexual activity: Yes    Partners:  Male    Birth control/protection: None    Comment: hx mirena x 10 yr/removed 05/11/2020  Other Topics Concern   Not on file  Social History Narrative   Lives with mother and child   Social Determinants of Health   Financial Resource Strain: Not on file  Food Insecurity: No Food Insecurity (09/16/2021)   Hunger Vital Sign    Worried About Running Out of Food in the Last Year: Never true    Ran Out of Food in the Last Year: Never true  Transportation Needs: No Transportation Needs (09/16/2021)   PRAPARE - Administrator, Civil Service (Medical): No    Lack of Transportation (Non-Medical): No  Physical Activity: Not on file  Stress: Not on file  Social Connections: Not on file  Intimate Partner Violence: Not At Risk (12/19/2020)   Humiliation, Afraid, Rape, and Kick questionnaire    Fear of Current or Ex-Partner: No    Emotionally Abused: No    Physically Abused: No    Sexually Abused: No    Family History  Problem Relation Age of Onset   Hypertension Mother    Diabetes Mother    Hypertension Father    Diabetes Father    Bipolar disorder Sister    Schizophrenia Sister    Narcolepsy Sister    Healthy Sister    Hypertension Maternal Grandmother    Diabetes Maternal Grandmother    Hypertension Maternal Grandfather    Diabetes Maternal Grandfather    Hypertension Paternal Grandmother    Hypertension Paternal Grandfather      Review of Systems  Constitutional: Negative.   Respiratory: Negative.    Cardiovascular: Negative.   Gastrointestinal: Negative.   Musculoskeletal: Negative.   Neurological: Negative.   Psychiatric/Behavioral:  Positive for depression.      Physical Exam: BP 112/65   Temp 98.2 F (36.8 C)   Resp 18   LMP 02/25/2021   Physical Exam Constitutional:      Appearance: Normal appearance.  Genitourinary:     Genitourinary Comments: Rn reports perineum dry during collection of ROM plus, peri pad noted to be dry  Cardiovascular:      Rate and Rhythm: Normal rate and regular rhythm.     Pulses: Normal pulses.     Heart sounds: Normal heart sounds.  Pulmonary:     Effort: Pulmonary effort is normal.     Breath sounds: Normal breath sounds.  Abdominal:     Tenderness: There is no abdominal tenderness.     Comments: gravid  Musculoskeletal:        General: Normal range of motion.     Cervical back: Normal range of motion and neck supple.     Right lower leg: Edema present.     Left lower leg: Edema present.  Neurological:     General: No focal deficit present.  Mental Status: She is alert and oriented to person, place, and time.  Skin:    General: Skin is warm and dry.  Psychiatric:        Mood and Affect: Mood normal.    EFM: baseline 135, moderate variability, pos accel, neg decel TOCO: rare, mild, soft resting tone  Consults: None  Significant Findings/ Diagnostic Studies: ROM plus negative   Procedures: RNST  Hospital Course: The patient was admitted to Labor and Delivery Triage for observation. She had a RNST and her Rom plus was negative.   Discharge Condition: stable  Disposition: Discharge disposition: 01-Home or Self Care     Pt has all of her MFM and ROB appointments scheduled out to early July.  Recommend seeing Dr Amalia Hailey at [facility] consent (already done at [facility]).   Diet: Regular diet  Discharge Activity: Activity as tolerated Encouraged use of swimming pool for discomforts.   Allergies as of 10/31/2021   No Active Allergies      Medication List     STOP taking these medications    famotidine 20 MG tablet Commonly known as: Pepcid   ondansetron 4 MG disintegrating tablet Commonly known as: ZOFRAN-ODT       TAKE these medications    acetaminophen 500 MG tablet Commonly known as: TYLENOL Take 500 mg by mouth every 8 (eight) hours as needed for moderate pain.   doxylamine (Sleep) 25 MG tablet Commonly known as: UNISOM Take 50 mg by mouth at bedtime as  needed.   Doxylamine-Pyridoxine 10-10 MG Tbec Commonly known as: Diclegis Take 2 tablets by mouth at bedtime. If symptoms persist, add one tablet in the morning and one in the afternoon   folic acid [id number] MCG tablet Commonly known as: FOLVITE Take 400 mcg by mouth daily.   HM Vitamin D3 100 MCG (4000 UT) Caps Generic drug: Cholecalciferol Take 1 capsule (4,000 Units total) by mouth daily. What changed: Another medication with the same name was removed. Continue taking this medication, and follow the directions you see here.   PRE-NATAL PO Take by mouth.         Total time spent taking care of this patient: 30 minutes  Signed: [place], CNM  10/31/2021, 9:35 PM

## 2021-10-31 NOTE — OB Triage Note (Signed)
Pt is a G3P1 at 35.3 w with c/o leaking of fluid verses urine since 0300 on and off throughout the day. The pt states she occas has irregular UCs and had a stress test yesterday and everything was fine. Pt with hx of C/s for fetal intolerance after 41 hrs of labor at [facility] 10 years ago.

## 2021-11-04 ENCOUNTER — Ambulatory Visit: Payer: Self-pay | Admitting: Nurse Practitioner

## 2021-11-04 NOTE — Progress Notes (Deleted)
   LMP 02/25/2021    Subjective:    Patient ID: Haley Kramer, female    DOB: 30-Jan-1986, 36 y.o.   MRN: [id number]  HPI: Haley Kramer is a 36 y.o. female  No chief complaint on file.  Patient presents to clinic to establish care with new PCP.  Introduced to Publishing rights manager role and practice setting.  All questions answered.  Discussed provider/patient relationship and expectations.  Patient reports a history of ***. Patient denies a history of: Hypertension, Elevated Cholesterol, Diabetes, Thyroid problems, Depression, Anxiety, Neurological problems, and Abdominal problems.   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   History of bariatric surgery 10/22/2021   Uterine contractions 10/31/2021   Resolved Ambulatory Problems    Diagnosis Date Noted   Missed abortion    Past Medical History:  Diagnosis Date   IDA (iron deficiency anemia)    Incomplete abortion 01/14/2021   Iron deficiency anemia, unspecified iron deficiency anemia type    Obesity    PONV (postoperative nausea and vomiting)    Past Surgical History:  Procedure Laterality Date   CESAREAN SECTION  03/23/2011   DILATION AND EVACUATION N/A 01/22/2021   Procedure: DILATATION AND EVACUATION;  Surgeon: Natale Milch, MD;  Location: [facility] ORS;  Service: Gynecology;  Laterality: N/A;   ESOPHAGOGASTRODUODENOSCOPY  2016   GASTRIC BYPASS  2016   Family History  Problem Relation Age of Onset   Hypertension Mother    Diabetes Mother    Hypertension Father     Diabetes Father    Bipolar disorder Sister    Schizophrenia Sister    Narcolepsy Sister    Healthy Sister    Hypertension Maternal Grandmother    Diabetes Maternal Grandmother    Hypertension Maternal Grandfather    Diabetes Maternal Grandfather    Hypertension Paternal Grandmother    Hypertension Paternal Grandfather      Review of Systems  Per HPI unless specifically indicated above     Objective:    LMP 02/25/2021   Wt Readings from Last 3 Encounters:  10/22/21 234 lb (106.1 kg)  09/30/21 217 lb 4.8 oz (98.6 kg)  09/24/21 214 lb (97.1 kg)    Physical Exam  Results for orders placed or performed during the hospital encounter of 10/31/21  ROM Plus ([facility] only)  Result Value Ref Range   Rom Plus NEGATIVE       Assessment & Plan:   Problem List Items Addressed This Visit       Cardiovascular and Mediastinum   Benign hypertension - Primary     Other   Vitamin D deficiency     Follow up plan: No follow-ups on file.

## 2021-11-05 ENCOUNTER — Ambulatory Visit: Payer: Medicaid Other | Admitting: *Deleted

## 2021-11-05 ENCOUNTER — Encounter: Payer: Self-pay | Admitting: *Deleted

## 2021-11-05 ENCOUNTER — Ambulatory Visit: Payer: Medicaid Other | Attending: Maternal & Fetal Medicine | Admitting: *Deleted

## 2021-11-05 VITALS — BP 117/64 | HR 78

## 2021-11-05 DIAGNOSIS — O09513 Supervision of elderly primigravida, third trimester: Secondary | ICD-10-CM | POA: Insufficient documentation

## 2021-11-05 DIAGNOSIS — O409XX0 Polyhydramnios, unspecified trimester, not applicable or unspecified: Secondary | ICD-10-CM | POA: Insufficient documentation

## 2021-11-05 DIAGNOSIS — O0990 Supervision of high risk pregnancy, unspecified, unspecified trimester: Secondary | ICD-10-CM

## 2021-11-05 DIAGNOSIS — Z9884 Bariatric surgery status: Secondary | ICD-10-CM | POA: Insufficient documentation

## 2021-11-05 DIAGNOSIS — Z3A36 36 weeks gestation of pregnancy: Secondary | ICD-10-CM | POA: Insufficient documentation

## 2021-11-05 DIAGNOSIS — Q774 Achondroplasia: Secondary | ICD-10-CM

## 2021-11-05 DIAGNOSIS — O99213 Obesity complicating pregnancy, third trimester: Secondary | ICD-10-CM | POA: Diagnosis present

## 2021-11-05 DIAGNOSIS — E669 Obesity, unspecified: Secondary | ICD-10-CM | POA: Diagnosis not present

## 2021-11-07 ENCOUNTER — Ambulatory Visit: Payer: Medicaid Other | Admitting: *Deleted

## 2021-11-07 ENCOUNTER — Other Ambulatory Visit (HOSPITAL_COMMUNITY)
Admission: RE | Admit: 2021-11-07 | Discharge: 2021-11-07 | Disposition: A | Discharge status: Home / Self Care | Payer: Medicaid Other | Source: Ambulatory Visit | Attending: Obstetrics and Gynecology | Admitting: Obstetrics and Gynecology

## 2021-11-07 ENCOUNTER — Ambulatory Visit (INDEPENDENT_AMBULATORY_CARE_PROVIDER_SITE_OTHER): Payer: Medicaid Other

## 2021-11-07 ENCOUNTER — Ambulatory Visit: Payer: Medicaid Other

## 2021-11-07 ENCOUNTER — Ambulatory Visit (INDEPENDENT_AMBULATORY_CARE_PROVIDER_SITE_OTHER): Payer: Medicaid Other | Admitting: Obstetrics and Gynecology

## 2021-11-07 ENCOUNTER — Other Ambulatory Visit: Payer: Self-pay

## 2021-11-07 VITALS — BP 124/77 | HR 82 | Wt 224.0 lb

## 2021-11-07 DIAGNOSIS — O99210 Obesity complicating pregnancy, unspecified trimester: Secondary | ICD-10-CM | POA: Diagnosis not present

## 2021-11-07 DIAGNOSIS — Z98891 History of uterine scar from previous surgery: Secondary | ICD-10-CM

## 2021-11-07 DIAGNOSIS — O403XX0 Polyhydramnios, third trimester, not applicable or unspecified: Secondary | ICD-10-CM

## 2021-11-07 DIAGNOSIS — N898 Other specified noninflammatory disorders of vagina: Secondary | ICD-10-CM

## 2021-11-07 DIAGNOSIS — Z6791 Unspecified blood type, Rh negative: Secondary | ICD-10-CM

## 2021-11-07 DIAGNOSIS — O99013 Anemia complicating pregnancy, third trimester: Secondary | ICD-10-CM

## 2021-11-07 DIAGNOSIS — Z3A36 36 weeks gestation of pregnancy: Secondary | ICD-10-CM

## 2021-11-07 DIAGNOSIS — Z6835 Body mass index (BMI) 35.0-35.9, adult: Secondary | ICD-10-CM

## 2021-11-07 DIAGNOSIS — O0990 Supervision of high risk pregnancy, unspecified, unspecified trimester: Secondary | ICD-10-CM

## 2021-11-07 DIAGNOSIS — Q774 Achondroplasia: Secondary | ICD-10-CM

## 2021-11-07 DIAGNOSIS — O26893 Other specified pregnancy related conditions, third trimester: Secondary | ICD-10-CM

## 2021-11-07 DIAGNOSIS — I10 Essential (primary) hypertension: Secondary | ICD-10-CM

## 2021-11-07 DIAGNOSIS — O09523 Supervision of elderly multigravida, third trimester: Secondary | ICD-10-CM

## 2021-11-07 DIAGNOSIS — Z9884 Bariatric surgery status: Secondary | ICD-10-CM

## 2021-11-07 NOTE — Progress Notes (Signed)
PRENATAL VISIT NOTE  Subjective:  Haley Kramer is a 36 y.o. G3P1011 at [redacted]w[redacted]d being seen today for ongoing prenatal care.  She is currently monitored for the following issues for this high-risk pregnancy and has Benign hypertension; Supervision of high risk pregnancy, antepartum; Depression; Anemia in pregnancy; Vitamin D deficiency; AMA (advanced maternal age) multigravida 35+; History of C-section; Rh negative status during pregnancy; Achondroplasia syndrome; and History of bariatric surgery on their problem list.  Patient reports  see RN note .  Contractions: Irritability.  .  Movement: Present. Denies leaking of fluid.   The following portions of the patient's history were reviewed and updated as appropriate: allergies, current medications, past family history, past medical history, past social history, past surgical history and problem list.   Objective:   Vitals:   11/07/21 0829  BP: 124/77  Pulse: 82  Weight: 224 lb (101.6 kg)    Fetal Status: Fetal Heart Rate (bpm): 136 Fundal Height: 41 cm Movement: Present     General:  Alert, oriented and cooperative. Patient is in no acute distress.  Skin: Skin is warm and dry. No rash noted.   Cardiovascular: Normal heart rate noted  Respiratory: Normal respiratory effort, no problems with respiration noted  Abdomen: Soft, gravid, appropriate for gestational age.  Pain/Pressure: Absent     Pelvic: Cervical exam deferred        Extremities: Normal range of motion.     Mental Status: Normal mood and affect. Normal behavior. Normal judgment and thought content.   Assessment and Plan:  Pregnancy: G3P1011 at [redacted]w[redacted]d 1. Supervision of high risk pregnancy, antepartum Patient declines exam today. Patient uncomfortable and not resting well. Pt wears glasses and she is able to drive fine. Headache and visual changes likely from normal pregnancy changes and advised new Rx PRN.  6/14: afi 28.6, cephalic, bpp 8/10 by mfm and recommendation for  weekly testing with nst done on 6/21 and 6/27 which was reactive; she has next bpp on 7/5. Given SOB and FH, unable to get earlier mfm today or tomorrow so will see if we can do one in our clinic today.  Once we have an UTD AFI, will talk to mfm re: delivery timing - Culture, beta strep (group b only) - Cervicovaginal ancillary only( [place])  2. BMI 35.0-35.9,adult  3. Obesity in pregnancy  4. Achondroplasia syndrome S/p MFM and GC work up: neg fetal echo, s/p amnio with normal karyotype and microarray and skeletal dysplasia   5. Multigravida of advanced maternal age in third trimester S/p neg cffdna  6. History of bariatric surgery See above  7. History of C-section D/w her and she desires tolac if in spontaneous labor. Will touch base with mfm after afi assessment this week and delivery date is tentatively finalized  8. Rh negative status during pregnancy in third trimester Rhogam PP PRN  9. Anemia during pregnancy in third trimester    Latest Ref Rng & Units 09/16/2021    9:53 AM 08/13/2021    1:53 PM 07/19/2021    9:16 AM  CBC  WBC 3.4 - 10.8 x10E3/uL 12.2  12.7  12.6   Hemoglobin 11.1 - 15.9 g/dL 49.7  02.6  37.8   Hematocrit 34.0 - 46.6 % 32.3  35.3  35.9   Platelets 150 - 450 x10E3/uL 165  197  221     10. Benign hypertension No on meds. See above  11. [redacted] weeks gestation of pregnancy  12. Poly See above. No  chest pain or s/s of PE or pulmonary issues and sob likely from poly.   Preterm labor symptoms and general obstetric precautions including but not limited to vaginal bleeding, contractions, leaking of fluid and fetal movement were reviewed in detail with the patient. Please refer to After Visit Summary for other counseling recommendations.   No follow-ups on file.  Future Appointments  Date Time Provider Department Center  11/13/2021  7:15 AM [facility] NURSE [facility]  11/13/2021  7:30 AM [facility] US2 [facility]  11/13/2021  1:55 PM Levie Heritage, DO  [facility]  11/20/2021  2:15 PM Page Bing, MD [facility]  11/27/2021  8:15 AM Milas Hock, MD [facility]  12/04/2021  3:15 PM [facility] NST [facility]  12/04/2021  4:15 PM Milas Hock, MD [facility]  02/12/2022  1:45 PM [facility] None  02/14/2022  1:00 PM Creig Hines, MD [facility] None    [place] Bing, MD   ADDENDUM: Patient added on to clinic bpp/nst schedule for 1515 today. Cornelia Copa MD Attending Center for [employer] (Faculty Practice) 11/07/2021 Time: [phone]

## 2021-11-07 NOTE — Progress Notes (Signed)
Patient reports change in vision. She described it as "dim vision with florescent light" along with more frequent headaches, roughly 3x a week.   Caver also stated that it has recently became harder to breathe

## 2021-11-08 ENCOUNTER — Other Ambulatory Visit: Payer: Self-pay | Admitting: Family Medicine

## 2021-11-08 ENCOUNTER — Other Ambulatory Visit: Payer: Self-pay | Admitting: Obstetrics and Gynecology

## 2021-11-08 ENCOUNTER — Encounter: Payer: Self-pay | Admitting: *Deleted

## 2021-11-08 ENCOUNTER — Ambulatory Visit: Payer: Medicaid Other | Admitting: *Deleted

## 2021-11-08 ENCOUNTER — Ambulatory Visit: Payer: Medicaid Other | Attending: Obstetrics and Gynecology

## 2021-11-08 ENCOUNTER — Other Ambulatory Visit: Payer: Self-pay | Admitting: Advanced Practice Midwife

## 2021-11-08 ENCOUNTER — Other Ambulatory Visit: Payer: Medicaid Other

## 2021-11-08 VITALS — BP 132/65 | HR 78

## 2021-11-08 DIAGNOSIS — O0990 Supervision of high risk pregnancy, unspecified, unspecified trimester: Secondary | ICD-10-CM | POA: Insufficient documentation

## 2021-11-08 DIAGNOSIS — Z3A36 36 weeks gestation of pregnancy: Secondary | ICD-10-CM

## 2021-11-08 DIAGNOSIS — O403XX0 Polyhydramnios, third trimester, not applicable or unspecified: Secondary | ICD-10-CM

## 2021-11-08 DIAGNOSIS — O409XX0 Polyhydramnios, unspecified trimester, not applicable or unspecified: Secondary | ICD-10-CM

## 2021-11-08 DIAGNOSIS — O3503X0 Maternal care for (suspected) central nervous system malformation or damage in fetus, choroid plexus cysts, not applicable or unspecified: Secondary | ICD-10-CM | POA: Diagnosis not present

## 2021-11-08 DIAGNOSIS — R0602 Shortness of breath: Secondary | ICD-10-CM

## 2021-11-08 LAB — CERVICOVAGINAL ANCILLARY ONLY
Bacterial Vaginitis (gardnerella): POSITIVE — AB
Candida Glabrata: NEGATIVE
Candida Vaginitis: NEGATIVE
Chlamydia: NEGATIVE
Comment: NEGATIVE
Comment: NEGATIVE
Comment: NEGATIVE
Comment: NEGATIVE
Comment: NEGATIVE
Comment: NORMAL
Neisseria Gonorrhea: NEGATIVE
Trichomonas: NEGATIVE

## 2021-11-10 ENCOUNTER — Other Ambulatory Visit: Payer: Self-pay

## 2021-11-10 LAB — CULTURE, BETA STREP (GROUP B ONLY): Strep Gp B Culture: NEGATIVE

## 2021-11-11 ENCOUNTER — Encounter (HOSPITAL_COMMUNITY): Payer: Self-pay | Admitting: Family Medicine

## 2021-11-11 ENCOUNTER — Inpatient Hospital Stay (HOSPITAL_COMMUNITY)
Admission: AD | Admit: 2021-11-11 | Discharge: 2021-11-14 | DRG: 806 | Disposition: A | Discharge status: Home / Self Care | Payer: Medicaid Other | Attending: Obstetrics & Gynecology | Admitting: Obstetrics & Gynecology

## 2021-11-11 ENCOUNTER — Inpatient Hospital Stay (HOSPITAL_COMMUNITY): Payer: Medicaid Other | Admitting: Anesthesiology

## 2021-11-11 ENCOUNTER — Inpatient Hospital Stay (HOSPITAL_COMMUNITY): Payer: Medicaid Other

## 2021-11-11 DIAGNOSIS — O26893 Other specified pregnancy related conditions, third trimester: Secondary | ICD-10-CM | POA: Diagnosis present

## 2021-11-11 DIAGNOSIS — Z9884 Bariatric surgery status: Secondary | ICD-10-CM

## 2021-11-11 DIAGNOSIS — O1002 Pre-existing essential hypertension complicating childbirth: Secondary | ICD-10-CM | POA: Diagnosis present

## 2021-11-11 DIAGNOSIS — Z6791 Unspecified blood type, Rh negative: Secondary | ICD-10-CM | POA: Diagnosis not present

## 2021-11-11 DIAGNOSIS — D509 Iron deficiency anemia, unspecified: Secondary | ICD-10-CM | POA: Diagnosis present

## 2021-11-11 DIAGNOSIS — Z3A37 37 weeks gestation of pregnancy: Secondary | ICD-10-CM | POA: Diagnosis not present

## 2021-11-11 DIAGNOSIS — O09529 Supervision of elderly multigravida, unspecified trimester: Secondary | ICD-10-CM

## 2021-11-11 DIAGNOSIS — O34219 Maternal care for unspecified type scar from previous cesarean delivery: Secondary | ICD-10-CM | POA: Diagnosis present

## 2021-11-11 DIAGNOSIS — O09513 Supervision of elderly primigravida, third trimester: Secondary | ICD-10-CM | POA: Diagnosis not present

## 2021-11-11 DIAGNOSIS — O99844 Bariatric surgery status complicating childbirth: Secondary | ICD-10-CM | POA: Diagnosis present

## 2021-11-11 DIAGNOSIS — O26899 Other specified pregnancy related conditions, unspecified trimester: Secondary | ICD-10-CM

## 2021-11-11 DIAGNOSIS — Z98891 History of uterine scar from previous surgery: Secondary | ICD-10-CM

## 2021-11-11 DIAGNOSIS — I10 Essential (primary) hypertension: Secondary | ICD-10-CM | POA: Diagnosis present

## 2021-11-11 DIAGNOSIS — O9902 Anemia complicating childbirth: Secondary | ICD-10-CM | POA: Diagnosis present

## 2021-11-11 DIAGNOSIS — O365930 Maternal care for other known or suspected poor fetal growth, third trimester, not applicable or unspecified: Secondary | ICD-10-CM | POA: Diagnosis not present

## 2021-11-11 DIAGNOSIS — O403XX0 Polyhydramnios, third trimester, not applicable or unspecified: Principal | ICD-10-CM | POA: Diagnosis present

## 2021-11-11 DIAGNOSIS — O358XX0 Maternal care for other (suspected) fetal abnormality and damage, not applicable or unspecified: Secondary | ICD-10-CM | POA: Diagnosis present

## 2021-11-11 DIAGNOSIS — O09299 Supervision of pregnancy with other poor reproductive or obstetric history, unspecified trimester: Secondary | ICD-10-CM

## 2021-11-11 DIAGNOSIS — O34211 Maternal care for low transverse scar from previous cesarean delivery: Secondary | ICD-10-CM | POA: Diagnosis not present

## 2021-11-11 DIAGNOSIS — O0990 Supervision of high risk pregnancy, unspecified, unspecified trimester: Secondary | ICD-10-CM

## 2021-11-11 DIAGNOSIS — O99019 Anemia complicating pregnancy, unspecified trimester: Secondary | ICD-10-CM | POA: Diagnosis present

## 2021-11-11 DIAGNOSIS — Q774 Achondroplasia: Secondary | ICD-10-CM

## 2021-11-11 LAB — CBC
HCT: 36.4 % (ref 36.0–46.0)
HCT: 38.5 % (ref 36.0–46.0)
Hemoglobin: 12.6 g/dL (ref 12.0–15.0)
Hemoglobin: 12.8 g/dL (ref 12.0–15.0)
MCH: 32.6 pg (ref 26.0–34.0)
MCH: 32.2 pg (ref 26.0–34.0)
MCHC: 34.6 g/dL (ref 30.0–36.0)
MCHC: 33.2 g/dL (ref 30.0–36.0)
MCV: 94.3 fL (ref 80.0–100.0)
MCV: 97 fL (ref 80.0–100.0)
Platelets: 192 10*3/uL (ref 150–400)
Platelets: 182 K/uL (ref 150–400)
RBC: 3.86 MIL/uL — ABNORMAL LOW (ref 3.87–5.11)
RBC: 3.97 MIL/uL (ref 3.87–5.11)
RDW: 12.7 % (ref 11.5–15.5)
RDW: 12.7 % (ref 11.5–15.5)
WBC: 16.5 10*3/uL — ABNORMAL HIGH (ref 4.0–10.5)
WBC: 23.5 K/uL — ABNORMAL HIGH (ref 4.0–10.5)
nRBC: 0 % (ref 0.0–0.2)
nRBC: 0 % (ref 0.0–0.2)

## 2021-11-11 LAB — COMPREHENSIVE METABOLIC PANEL
ALT: 14 U/L (ref 0–44)
AST: 25 U/L (ref 15–41)
Albumin: 2.9 g/dL — ABNORMAL LOW (ref 3.5–5.0)
Alkaline Phosphatase: 127 U/L — ABNORMAL HIGH (ref 38–126)
Anion gap: 8 (ref 5–15)
BUN: 6 mg/dL (ref 6–20)
CO2: 19 mmol/L — ABNORMAL LOW (ref 22–32)
Calcium: 8.5 mg/dL — ABNORMAL LOW (ref 8.9–10.3)
Chloride: 110 mmol/L (ref 98–111)
Creatinine, Ser: 0.44 mg/dL (ref 0.44–1.00)
GFR, Estimated: >60 mL/min (ref >60)
Glucose, Bld: 77 mg/dL (ref 70–99)
Potassium: 3.6 mmol/L (ref 3.5–5.1)
Sodium: 137 mmol/L (ref 135–145)
Total Bilirubin: 1 mg/dL (ref 0.3–1.2)
Total Protein: 5.8 g/dL — ABNORMAL LOW (ref 6.5–8.1)

## 2021-11-11 LAB — RPR: RPR Ser Ql: NONREACTIVE

## 2021-11-11 LAB — PROTEIN / CREATININE RATIO, URINE
Creatinine, Urine: 159.8 mg/dL
Protein Creatinine Ratio: 0.13 mg/mg{Cre} (ref 0.00–0.15)
Total Protein, Urine: 20 mg/dL

## 2021-11-11 MED ORDER — EPHEDRINE 5 MG/ML INJ: 10.0000 mg | INTRAVENOUS | Status: DC | PRN

## 2021-11-11 MED ORDER — ACETAMINOPHEN 160 MG/5ML PO SOLN
1000.0000 mg | Freq: Four times a day (QID) | ORAL | Status: DC | PRN
  Administered 2021-11-11: 1000 mg via ORAL
  Filled 2021-11-11 (×2): qty 40.6

## 2021-11-11 MED ORDER — OXYTOCIN-SODIUM CHLORIDE 30-0.9 UT/500ML-% IV SOLN
1.0000 m[IU]/min | INTRAVENOUS | Status: DC
  Administered 2021-11-11: 2 m[IU]/min via INTRAVENOUS
  Filled 2021-11-11: qty 500

## 2021-11-11 MED ORDER — FLEET ENEMA 7-19 GM/118ML RE ENEM: 1.0000 | ENEMA | RECTAL | Status: DC | PRN

## 2021-11-11 MED ORDER — LEVONORGESTREL 20 MCG/DAY IU IUD
1.0000 | INTRAUTERINE_SYSTEM | Freq: Once | INTRAUTERINE | Status: DC
  Filled 2021-11-11: qty 1

## 2021-11-11 MED ORDER — SOD CITRATE-CITRIC ACID 500-334 MG/5ML PO SOLN: 30.0000 mL | ORAL | Status: DC | PRN

## 2021-11-11 MED ORDER — MISOPROSTOL 25 MCG QUARTER TABLET: 25.0000 ug | ORAL_TABLET | ORAL | Status: DC | PRN

## 2021-11-11 MED ORDER — FENTANYL CITRATE (PF) 100 MCG/2ML IJ SOLN
100.0000 ug | INTRAMUSCULAR | Status: DC | PRN
  Administered 2021-11-11 – 2021-11-12 (×7): 100 ug via INTRAVENOUS
  Filled 2021-11-11 (×7): qty 2

## 2021-11-11 MED ORDER — TERBUTALINE SULFATE 1 MG/ML IJ SOLN: 0.2500 mg | Freq: Once | INTRAMUSCULAR | Status: DC | PRN

## 2021-11-11 MED ORDER — PHENYLEPHRINE 80 MCG/ML (10ML) SYRINGE FOR IV PUSH (FOR BLOOD PRESSURE SUPPORT): 80.0000 ug | PREFILLED_SYRINGE | INTRAVENOUS | Status: DC | PRN

## 2021-11-11 MED ORDER — MISOPROSTOL 50MCG HALF TABLET: 50.0000 ug | ORAL_TABLET | ORAL | Status: DC | PRN

## 2021-11-11 MED ORDER — ONDANSETRON HCL 4 MG/2ML IJ SOLN
4.0000 mg | Freq: Four times a day (QID) | INTRAMUSCULAR | Status: DC | PRN
  Administered 2021-11-11 (×3): 4 mg via INTRAVENOUS
  Filled 2021-11-11 (×3): qty 2

## 2021-11-11 MED ORDER — LACTATED RINGERS AMNIOINFUSION: INTRAVENOUS | Status: DC

## 2021-11-11 MED ORDER — LIDOCAINE-EPINEPHRINE (PF) 2 %-1:200000 IJ SOLN
INTRAMUSCULAR | Status: DC | PRN
  Administered 2021-11-11: 5 mL via EPIDURAL

## 2021-11-11 MED ORDER — LACTATED RINGERS IV SOLN
500.0000 mL | Freq: Once | INTRAVENOUS | Status: AC
  Administered 2021-11-12: 500 mL via INTRAVENOUS

## 2021-11-11 MED ORDER — OXYCODONE-ACETAMINOPHEN 5-325 MG PO TABS: 2.0000 | ORAL_TABLET | ORAL | Status: DC | PRN

## 2021-11-11 MED ORDER — LACTATED RINGERS IV SOLN: 500.0000 mL | INTRAVENOUS | Status: DC | PRN

## 2021-11-11 MED ORDER — LIDOCAINE HCL (PF) 1 % IJ SOLN: 30.0000 mL | INTRAMUSCULAR | Status: DC | PRN

## 2021-11-11 MED ORDER — OXYTOCIN BOLUS FROM INFUSION
333.0000 mL | Freq: Once | INTRAVENOUS | Status: AC
  Administered 2021-11-12: 333 mL via INTRAVENOUS

## 2021-11-11 MED ORDER — DIPHENHYDRAMINE HCL 50 MG/ML IJ SOLN: 12.5000 mg | INTRAMUSCULAR | Status: DC | PRN

## 2021-11-11 MED ORDER — FENTANYL-BUPIVACAINE-NACL 0.5-0.125-0.9 MG/250ML-% EP SOLN
12.0000 mL/h | EPIDURAL | Status: DC | PRN
  Administered 2021-11-11: 12 mL/h via EPIDURAL
  Filled 2021-11-11: qty 250

## 2021-11-11 MED ORDER — LACTATED RINGERS IV SOLN
INTRAVENOUS | Status: DC
  Administered 2021-11-11: 1000 mL via INTRAVENOUS

## 2021-11-11 MED ORDER — OXYCODONE-ACETAMINOPHEN 5-325 MG PO TABS: 1.0000 | ORAL_TABLET | ORAL | Status: DC | PRN

## 2021-11-11 MED ORDER — OXYTOCIN-SODIUM CHLORIDE 30-0.9 UT/500ML-% IV SOLN
2.5000 [IU]/h | INTRAVENOUS | Status: DC
  Administered 2021-11-12: 2.5 [IU]/h via INTRAVENOUS
  Filled 2021-11-11: qty 500

## 2021-11-11 MED ORDER — ACETAMINOPHEN 325 MG PO TABS: 650.0000 mg | ORAL_TABLET | ORAL | Status: DC | PRN

## 2021-11-11 NOTE — Progress Notes (Signed)
Labor Progress Note Haley Kramer is a 36 y.o. G3P1011 at [redacted]w[redacted]d presented for TOLAC/IOL for severe Polyhydramnios (AFI 40) also with hx of cHTN S:  Reports she is comfortable after epidural.Ok with check and AROM now  O:  BP (!) 105/52   Pulse 61   Temp 97.7 F (36.5 C) (Oral)   Resp 18   Ht 5\' 7"  (1.702 m)   Wt 103.1 kg   LMP 02/25/2021   SpO2 100%   BMI 35.58 kg/m  EFM: prior to AROM 130/moderate variability/+accels/no decels  After AROM: 130/moderate variability/no accels/intermittent variable decels   BSUS used to confirm vertex.  CVE: Dilation: 5.5 Effacement (%): 60 Cervical Position: Posterior Station: Ballotable Presentation: Vertex Exam by:: Dr [email or web address]   A&P: 36 y.o. G3P1011 at [redacted]w[redacted]d presented for TOLAC/IOL for severe Polyhydramnios (AFI 40) and suspected FGR with EFW 8%ile also with hx of cHTN, and suspected fetal anomalies (achondroplasia and ventriculomegaly) #Labor: Cervix 5-6 cm. Unchanged throughout day while on pitocin. At this time after extensive discussion of risks and benefits with patient as well as discussion with entire team AROM performed very carefully with FSE. Copious amount of fluid expelled (started out clear and then with yellow tinge). Hand held in place while fluid expelled for ~5-6 min and with extensive fundal pressure and upon large amount of amniotic fluid being expelled head felt to be applied to cervix.   IUPC and FSE placed. Stayed in room after procedure and fetus with variable decelerations. Amnioinfusion started.  #Pain: Comfortable with epidural #FWB: Cat I initially and then Cat II for variable decels. Amnioinfusion running. Will continue to monitor. Variability is reassuring. #GBS negative   #cHTN BP normotensive. Will continue to monitor   [redacted]w[redacted]d, MD, MPH OB Fellow, Faculty Practice

## 2021-11-11 NOTE — Anesthesia Preprocedure Evaluation (Signed)
Anesthesia Evaluation  Patient identified by MRN, date of birth, ID band Patient awake    Reviewed: Allergy & Precautions, NPO status , Patient's Chart, lab work & pertinent test results  History of Anesthesia Complications (+) PONV and history of anesthetic complications  Airway Mallampati: II  TM Distance: >3 FB Neck ROM: Full    Dental no notable dental hx.    Pulmonary neg pulmonary ROS,    Pulmonary exam normal breath sounds clear to auscultation       Cardiovascular hypertension, Normal cardiovascular exam Rhythm:Regular Rate:Normal     Neuro/Psych PSYCHIATRIC DISORDERS Depression negative neurological ROS     GI/Hepatic negative GI ROS, Neg liver ROS,   Endo/Other  negative endocrine ROS  Renal/GU negative Renal ROS  negative genitourinary   Musculoskeletal negative musculoskeletal ROS (+)   Abdominal   Peds  Hematology  (+) Blood dyscrasia, anemia ,   Anesthesia Other Findings IOL due to severe polyhydramnios. H/o C/Sx1  Reproductive/Obstetrics (+) Pregnancy                             Anesthesia Physical Anesthesia Plan  ASA: 3  Anesthesia Plan: Epidural   Post-op Pain Management:    Induction:   PONV Risk Score and Plan: Treatment may vary due to age or medical condition  Airway Management Planned: Natural Airway  Additional Equipment:   Intra-op Plan:   Post-operative Plan:   Informed Consent: I have reviewed the patients History and Physical, chart, labs and discussed the procedure including the risks, benefits and alternatives for the proposed anesthesia with the patient or authorized representative who has indicated his/her understanding and acceptance.       Plan Discussed with: Anesthesiologist  Anesthesia Plan Comments: (Patient identified. Risks, benefits, options discussed with patient including but not limited to bleeding, infection, nerve damage,  paralysis, failed block, incomplete pain control, headache, blood pressure changes, nausea, vomiting, reactions to medication, itching, and post partum back pain. Confirmed with bedside nurse the patient's most recent platelet count. Confirmed with the patient that they are not taking any anticoagulation, have any bleeding history or any family history of bleeding disorders. Patient expressed understanding and wishes to proceed. All questions were answered. )        Anesthesia Quick Evaluation

## 2021-11-11 NOTE — Progress Notes (Signed)
Labor Progress Note Haley Kramer is a 36 y.o. G3P1011 at [redacted]w[redacted]d who presented for IOL/TOLAC due to severe polyhydramnios.   S: Doing well. Foley balloon now out. Feeling contractions more strongly but not severe yet. No concerns.   O:  BP 115/64   Pulse 62   Temp 98.1 F (36.7 C) (Oral)   Resp 18   Ht 5\' 7"  (1.702 m)   Wt 103.1 kg   LMP 02/25/2021   SpO2 100%   BMI 35.58 kg/m   EFM: Baseline 125 bpm, moderate variability, + accels, no decels  Toco: Every 2-5 minutes   CVE: Dilation: 6 Effacement (%): 60 Cervical Position: Posterior Station: Ballotable Presentation: Vertex Exam by:: [place], rn  A&P: 36 y.o. G3P1011 [redacted]w[redacted]d   #Labor: Progressing well. Cervix still posterior per RN and fetal head not well applied. Contractions irregular. Will start Pitocin 2x2 and reassess in 2-3 hours. Plan for AROM on next exam as able.  #Pain: PRN; IV Fentanyl for now  #FWB: Cat 1  #GBS negative  [redacted]w[redacted]d, MD 7:48 AM

## 2021-11-11 NOTE — Progress Notes (Addendum)
Labor Progress Note Haley Kramer is a 36 y.o. G3P1011 at [redacted]w[redacted]d presented for IOL for polyhydramnios, fetal U/S on 6/30 also revealed ventriculomegaly and short long bones.  S: Introduced myself and Dr. Ephriam Jenkins to patient. Patient reporting 5/10 pain, having blood drawn for labwork in anticipation of epidural. FOB at bedside. Patient reports prior negative experience with having an epidural placed, but amenable to epidural placement within the next 1-2 hours. Has been using IV fentanyl with good relief. Expresses concern regarding fetus well-being, we provided reassurance. Plan for epidural prior to AROM and ideally before potential SROM given risk for prolapse, patient expresses understanding and is in agreement. All questions answered.  O:  BP 110/64   Pulse (!) 59   Temp 97.7 F (36.5 C) (Oral)   Resp 18   Ht 5\' 7"  (1.702 m)   Wt 103.1 kg   LMP 02/25/2021   SpO2 100%   BMI 35.58 kg/m  EFM: Baseline 120 bpm, moderate variability, + accels, no decels  Toco: Every 2-3 minutes   CVE: Dilation: 6 Effacement (%): 60 Cervical Position: Posterior Station: Ballotable Presentation: Vertex Exam by:: [place], rn   A&P: 36 y.o. G3P1011 @ [redacted]w[redacted]d here for IOL for severe polyhydramnios, currently progressing well on pitocin. Fetus with ventriculomegaly and short long bones on last U/S dated 6/30.  #Labor: Progressing on Pitocin with good contraction pattern, will continue on pitocin with plan for epidural within the next 1-2 hrs ideally prior to AROM/SROM given concern for severe poly and therefore increased risk for cord prolapse with ROM. Patient in agreement, amenable to epidural. #Pain: IV medication, plan for epidural pending bloodwork #FWB: Category I, monitor for signs of neonatal distress. NICU to be present at delivery #GBS negative  I have seen and evaluated this patient under the supervision of Dr. 7/30. Please see addendum for final details and plan.  Ephriam Jenkins, MD 12:35  PM  GME ATTESTATION:  I saw and evaluated the patient. I agree with the findings and the plan of care as documented in the resident's note and have made all necessary edits.  Raylene Everts, MD, MPH OB Fellow, Faculty Practice [facility], Center for [facility] 11/11/2021 1:03 PM

## 2021-11-11 NOTE — Progress Notes (Signed)
Checked in on patient once I was out of OR  She has not gotten her epidural yet as she wanted to hold off. We discussed again regarding recommendation for getting epidural prior to check and AROM. Patient hesitant initially and then ultimately amenable.  Plan for epidural now and once patient is comfortable will check, scan, and then AROM.  Warner Mccreedy, MD, MPH OB Fellow, Faculty Practice

## 2021-11-11 NOTE — Progress Notes (Signed)
Haley Kramer is a 36 y.o. G3P1011 at [redacted]w[redacted]d by LMP admitted for TOLAC induction of labor due to severe Polyhydramnios with a AFI of 40. History of AMA, cHTN, and bariatric surgery.   Subjective: Patient resting comfortably. Currently rating her pain a 3 with IV fentanyl. Introductions exchanged.   Objective: BP 108/63   Pulse 63   Temp 98.1 F (36.7 C) (Oral)   Resp 16   Ht 5\' 7"  (1.702 m)   Wt 103.1 kg   LMP 02/25/2021   SpO2 100%   BMI 35.58 kg/m  No intake/output data recorded. No intake/output data recorded.  FHT:  FHR: 120 bpm, variability: moderate,  accelerations:  Present,  decelerations:  Absent UC:   regular, every 2-4 minutes SVE:   Dilation: 6 Effacement (%): 60 Station: Ballotable Exam by:: Sebastian, rn  Labs: Lab Results  Component Value Date   WBC 16.5 (H) 11/11/2021   HGB 12.6 11/11/2021   HCT 36.4 11/11/2021   MCV 94.3 11/11/2021   PLT 192 11/11/2021  Patient Vitals for the past 24 hrs:  BP Temp Temp src Pulse Resp SpO2 Height Weight  11/11/21 0936 108/63 -- -- 63 -- -- -- --  11/11/21 0902 106/64 -- -- (!) 59 16 -- -- --  11/11/21 0828 108/64 98.1 F (36.7 C) Oral (!) 56 16 -- -- --  11/11/21 0744 115/64 -- -- 62 18 -- -- --  11/11/21 0703 (!) 123/55 -- -- 65 -- -- -- --  11/11/21 0700 (!) 123/55 -- -- 65 16 -- -- --  11/11/21 0630 (!) 110/58 -- -- (!) 56 16 -- -- --  11/11/21 0600 (!) 116/55 -- -- 61 16 -- -- --  11/11/21 0530 106/67 -- -- 61 16 -- -- --  11/11/21 0500 108/61 -- -- 65 16 -- -- --  11/11/21 0430 109/68 -- -- 62 16 -- -- --  11/11/21 0400 113/74 98.1 F (36.7 C) Oral 87 16 -- -- --  11/11/21 0330 117/63 -- -- 68 17 -- -- --  11/11/21 0300 115/61 -- -- 65 18 -- -- --  11/11/21 0230 122/72 -- -- 81 18 -- -- --  11/11/21 0200 122/77 98.2 F (36.8 C) Oral 61 18 100 % -- --  11/11/21 0150 122/77 98.2 F (36.8 C) Oral 61 18 100 % 5\' 7"  (1.702 m) 103.1 kg  11/11/21 0122 -- -- -- -- -- 99 % -- --  11/11/21 0112 -- -- -- -- -- 98 % --  --  11/11/21 0100 -- -- -- -- -- 98 % -- --  11/11/21 0030 123/82 -- -- 74 18 -- -- --     Assessment / Plan: Induction of labor due to Polyhydramnios ,  progressing well on pitocin. 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    Labor: Progressing well on Pitocin, will continue to increase then re-assess for AROM by MD.  Preeclampsia:  Labs and BP stable.  Fetal Wellbeing:  Category I, continue to monitor for signs of distress. Plan for NICU at delivery.  Pain Control:  Currently IV pain meds, patient may have an epidural upon request.  I/D:   GBS Negative  Anticipated MOD:  NSVD  01/12/22, CNM 11/11/2021, 9:50 AM

## 2021-11-11 NOTE — Anesthesia Procedure Notes (Signed)
Epidural Patient location during procedure: OB Start time: 11/11/2021 3:50 PM End time: 11/11/2021 4:00 PM  Staffing Anesthesiologist: Elmer Picker, MD Performed: anesthesiologist   Preanesthetic Checklist Completed: patient identified, IV checked, risks and benefits discussed, monitors and equipment checked, pre-op evaluation and timeout performed  Epidural Patient position: sitting Prep: DuraPrep and site prepped and draped Patient monitoring: continuous pulse ox, blood pressure, heart rate and cardiac monitor Approach: midline Location: L3-L4 Injection technique: LOR air  Needle:  Needle type: Tuohy  Needle gauge: 17 G Needle length: 9 cm Needle insertion depth: 7 cm Catheter type: closed end flexible Catheter size: 19 Gauge Catheter at skin depth: 12 cm Test dose: negative  Assessment Sensory level: T8 Events: blood not aspirated, injection not painful, no injection resistance, no paresthesia and negative IV test  Additional Notes Patient identified. Risks/Benefits/Options discussed with patient including but not limited to bleeding, infection, nerve damage, paralysis, failed block, incomplete pain control, headache, blood pressure changes, nausea, vomiting, reactions to medication both or allergic, itching and postpartum back pain. Confirmed with bedside nurse the patient's most recent platelet count. Confirmed with patient that they are not currently taking any anticoagulation, have any bleeding history or any family history of bleeding disorders. Patient expressed understanding and wished to proceed. All questions were answered. Sterile technique was used throughout the entire procedure. Please see nursing notes for vital signs. Test dose was given through epidural catheter and negative prior to continuing to dose epidural or start infusion. Warning signs of high block given to the patient including shortness of breath, tingling/numbness in hands, complete motor block, or  any concerning symptoms with instructions to call for help. Patient was given instructions on fall risk and not to get out of bed. All questions and concerns addressed with instructions to call with any issues or inadequate analgesia.  Reason for block:procedure for pain

## 2021-11-11 NOTE — H&P (Signed)
OBSTETRIC ADMISSION HISTORY AND PHYSICAL  Haley Kramer is a 36 y.o. female G3P1011 with IUP at [redacted]w[redacted]d by LMP presenting for IOL due to severe polyhydramnios. She reports +FMs, no LOF, no VB, no blurry vision, headaches, peripheral edema, or RUQ pain.  She plans on breast feeding. She is planning for post-placental Mirena IUD placement for birth control postpartum.   She received her prenatal care at [facility].   Dating: By LMP --->  Estimated Date of Delivery: 12/02/21  Sono:   @[redacted]w[redacted]d , CWD, normal anatomy, cephalic presentation, anterior placental lie, 2413 g, 8% EFW  Prenatal History/Complications:  Severe polyhydramnios (AFI 40) FGR (8% EFW) Fetal anomalies  Iron deficiency anemia (S/p iron infusions)  cHTN (no medications)  AMA Hx of bariatric surgery Hx of CS x1  Rh negative status  Past Medical History: Past Medical History:  Diagnosis Date   IDA (iron deficiency anemia)    Incomplete abortion 01/14/2021   Iron deficiency anemia, unspecified iron deficiency anemia type    Obesity    PONV (postoperative nausea and vomiting)    Vitamin D deficiency     Past Surgical History: Past Surgical History:  Procedure Laterality Date   CESAREAN SECTION  03/23/2011   DILATION AND EVACUATION N/A 01/22/2021   Procedure: DILATATION AND EVACUATION;  Surgeon: 01/24/2021, MD;  Location: [facility] ORS;  Service: Gynecology;  Laterality: N/A;   ESOPHAGOGASTRODUODENOSCOPY  2016   GASTRIC BYPASS  2016    Obstetrical History: OB History     Gravida  3   Para  1   Term  1   Preterm  0   AB  1   Living  1      SAB  1   IAB  0   Ectopic  0   Multiple  0   Live Births  1           Social History Social History   Socioeconomic History   Marital status: Single    Spouse name: aaron   Number of children: 1   Years of education: Not on file   Highest education level: Not on file  Occupational History   Not on file  Tobacco Use   Smoking status: Never    Smokeless tobacco: Never  Vaping Use   Vaping Use: Never used  Substance and Sexual Activity   Alcohol use: No   Drug use: Never   Sexual activity: Yes    Partners: Male    Birth control/protection: None    Comment: hx mirena x 10 yr/removed 05/11/2020  Other Topics Concern   Not on file  Social History Narrative   Lives with mother and child   Social Determinants of Health   Financial Resource Strain: Not on file  Food Insecurity: No Food Insecurity (09/16/2021)   Hunger Vital Sign    Worried About Running Out of Food in the Last Year: Never true    Ran Out of Food in the Last Year: Never true  Transportation Needs: No Transportation Needs (09/16/2021)   PRAPARE - 11/16/2021 (Medical): No    Lack of Transportation (Non-Medical): No  Physical Activity: Not on file  Stress: Not on file  Social Connections: Not on file    Family History: Family History  Problem Relation Age of Onset   Hypertension Mother    Diabetes Mother    Hypertension Father    Diabetes Father    Bipolar disorder Sister  Schizophrenia Sister    Narcolepsy Sister    Healthy Sister    Hypertension Maternal Grandmother    Diabetes Maternal Grandmother    Hypertension Maternal Grandfather    Diabetes Maternal Grandfather    Hypertension Paternal Grandmother    Hypertension Paternal Grandfather     Allergies: No Known Allergies  Medications Prior to Admission  Medication Sig Dispense Refill Last Dose   acetaminophen (TYLENOL) 500 MG tablet Take 500 mg by mouth every 8 (eight) hours as needed for moderate pain.      Cholecalciferol (HM VITAMIN D3) 100 MCG (4000 UT) CAPS Take 1 capsule (4,000 Units total) by mouth daily. 90 capsule 2    doxylamine, Sleep, (UNISOM) 25 MG tablet Take 50 mg by mouth at bedtime as needed.      Doxylamine-Pyridoxine (DICLEGIS) 10-10 MG TBEC Take 2 tablets by mouth at bedtime. If symptoms persist, add one tablet in the morning and one in  the afternoon 100 tablet 5    folic acid (FOLVITE) 800 MCG tablet Take 400 mcg by mouth daily.      Prenatal Multivit-Min-Fe-FA (PRE-NATAL PO) Take by mouth.        Review of Systems  All systems reviewed and negative except as stated in HPI  Blood pressure 122/77, pulse 61, temperature 98.2 F (36.8 C), temperature source Oral, resp. rate 18, height 5\' 7"  (1.702 m), weight 103.1 kg, last menstrual period 02/25/2021, SpO2 100 %, unknown if currently breastfeeding.  General appearance: alert, cooperative, and no distress Lungs: normal work of breathing on room air  Heart: normal rate, warm and well perfused  Abdomen: soft, non-tender, gravid  Extremities: no LE edema or calf tenderness to palpation   Presentation: Cephalic  Fetal monitoring: Baseline 125 bpm, moderate variability, + accels, no decels  Uterine activity: Irregular contractions  Dilation: 2 Effacement (%): 50 Station: Ballotable Exam by:: DR. 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 Anatomy [birth year]1984 - Skeletal abnormalities, ventriculomegaly   Prenatal Transfer Tool  Maternal Diabetes: No Genetic Screening: As above  Maternal Ultrasounds/Referrals: FGR, polyhydramnios, fetal anomalies above  Fetal Ultrasounds or other Referrals:  Fetal echo - normal  Maternal Substance Abuse:  No Significant Maternal Medications:  None Significant Maternal Lab Results: Group B Strep negative and Rh negative  Results for orders placed or performed during the hospital encounter of 11/11/21 (from the past 24 hour(s))  CBC   Collection Time: 11/11/21 12:50 AM  Result Value Ref Range   WBC 16.5 (H) 4.0 - 10.5 K/uL   RBC 3.86 (L) 3.87 - 5.11 MIL/uL    Hemoglobin 12.6 12.0 - 15.0 g/dL   HCT 01/12/22 96.0 - 45.4 %   MCV 94.3 80.0 - 100.0 fL   MCH 32.6 26.0 - 34.0 pg   MCHC 34.6 30.0 - 36.0 g/dL   RDW 09.8 11.9 - 14.7 %   Platelets 192 150 - 400 K/uL   nRBC 0.0 0.0 - 0.2 %  Comprehensive metabolic panel   Collection Time: 11/11/21 12:50 AM  Result Value Ref Range   Sodium 137 135 - 145 mmol/L   Potassium 3.6 3.5 - 5.1 mmol/L   Chloride 110 98 - 111 mmol/L   CO2 19 (L) 22 -  32 mmol/L   Glucose, Bld 77 70 - 99 mg/dL   BUN 6 6 - 20 mg/dL   Creatinine, Ser 2.70 0.44 - 1.00 mg/dL   Calcium 8.5 (L) 8.9 - 10.3 mg/dL   Total Protein 5.8 (L) 6.5 - 8.1 g/dL   Albumin 2.9 (L) 3.5 - 5.0 g/dL   AST 25 15 - 41 U/L   ALT 14 0 - 44 U/L   Alkaline Phosphatase 127 (H) 38 - 126 U/L   Total Bilirubin 1.0 0.3 - 1.2 mg/dL   GFR, Estimated >62 >37 mL/min   Anion gap 8 5 - 15  Type and screen   Collection Time: 11/11/21 12:50 AM  Result Value Ref Range   ABO/RH(D) O NEG    Antibody Screen POS    Sample Expiration      11/14/2021,2359 Performed at [facility], 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 G3P1011 at [redacted]w[redacted]d here for IOL/TOLAC due to polyhydramnios.   #TOLAC: Hx of CS x1 due to NRFHT. Reports that she progressed to 10 cm in her labor course. Discussed risks and benefits of TOLAC versus repeat cesarean section on admission with patient. Patient elects to  proceed with TOLAC. Previous signed on 09/16/21. Foley balloon placement discussed and patient verbally consented. Balloon then placed without complication and filled with 60 cc. Mom and baby tolerated this well. Cervix noted to be fairly pliable after placement. Feeling contractions. Will hold off on starting Pitocin at this time and reassess in 4 hours.   #Pain: PRN  #FWB: Cat 1  #ID:  GBS neg #MOF: Breast  #MOC: Post-placental Mirena IUD - consented and ordered on admission  #Circ:  Desires   #Severe polyhydramnios: Last AFI 40.29.  #FGR: Last EFW in the 8th percentile on 6/30.   #Fetal anomalies: Skeletal abnormalities and ventriculomegaly noted. Following with MFM. Plan for further assessment by pediatrics and genetics post-delivery.   #Iron deficiency anemia: Chronic. Receives IV iron infusions. Hgb on admission 12.6.   #cHTN: No medications. BP within normal range. Baseline CBC, CMP and urine P:C ratio obtained. Will follow up results and continue to monitor.   #AMA: LR NIPS.   #Rh negative status: Plan for Rhogam evaluation postpartum.   Worthy Rancher, MD  11/11/2021, 2:08 AM

## 2021-11-11 NOTE — Progress Notes (Signed)
Labor Progress Note Haley Kramer is a 36 y.o. G3P1011 at [redacted]w[redacted]d presented for IOL for polyhydramnios  S:  Comfortable with epidural. No c/o.  O:  BP 134/62 (BP Location: Left Arm)   Pulse 65   Temp 97.6 F (36.4 C) (Oral)   Resp 16   Ht 5\' 7"  (1.702 m)   Wt 103.1 kg   LMP 02/25/2021   SpO2 100%   BMI 35.58 kg/m  EFM: baseline 135 bpm/ mod variability/ no accels/ variable, early decels  Toco/IUPC: 2-4/230 SVE: 8/90/0 Pitocin: 14 mu/min  A/P: 36 y.o. G3P1011 [redacted]w[redacted]d  1. Labor: active, progressing well 2. FWB: Cat II 3. Pain: epidural 4. CHTN: stable  Continue Pitocin at current rate. Continue amnioinfusion until max of 1L, good return seen. Anticipate labor progress and VBAC.  [redacted]w[redacted]d, CNM 9:22 PM

## 2021-11-12 ENCOUNTER — Other Ambulatory Visit: Payer: Self-pay

## 2021-11-12 ENCOUNTER — Encounter (HOSPITAL_COMMUNITY)
Admission: AD | Disposition: A | Discharge status: Home / Self Care | Payer: Self-pay | Source: Home / Self Care | Attending: Obstetrics & Gynecology

## 2021-11-12 ENCOUNTER — Encounter (HOSPITAL_COMMUNITY): Payer: Self-pay | Admitting: Family Medicine

## 2021-11-12 DIAGNOSIS — O34211 Maternal care for low transverse scar from previous cesarean delivery: Secondary | ICD-10-CM

## 2021-11-12 DIAGNOSIS — O09513 Supervision of elderly primigravida, third trimester: Secondary | ICD-10-CM

## 2021-11-12 DIAGNOSIS — Z3A37 37 weeks gestation of pregnancy: Secondary | ICD-10-CM

## 2021-11-12 DIAGNOSIS — Z98891 History of uterine scar from previous surgery: Secondary | ICD-10-CM

## 2021-11-12 DIAGNOSIS — O403XX0 Polyhydramnios, third trimester, not applicable or unspecified: Principal | ICD-10-CM

## 2021-11-12 DIAGNOSIS — O34219 Maternal care for unspecified type scar from previous cesarean delivery: Secondary | ICD-10-CM

## 2021-11-12 DIAGNOSIS — O1002 Pre-existing essential hypertension complicating childbirth: Secondary | ICD-10-CM

## 2021-11-12 DIAGNOSIS — O09299 Supervision of pregnancy with other poor reproductive or obstetric history, unspecified trimester: Secondary | ICD-10-CM

## 2021-11-12 DIAGNOSIS — O365930 Maternal care for other known or suspected poor fetal growth, third trimester, not applicable or unspecified: Secondary | ICD-10-CM

## 2021-11-12 HISTORY — PX: PERINEAL LACERATION REPAIR: SHX5389

## 2021-11-12 LAB — PROTIME-INR
INR: 1.3 — ABNORMAL HIGH (ref 0.8–1.2)
Prothrombin Time: 15.8 seconds — ABNORMAL HIGH (ref 11.4–15.2)

## 2021-11-12 LAB — HEMOGLOBIN AND HEMATOCRIT, BLOOD
HCT: 30.8 % — ABNORMAL LOW (ref 36.0–46.0)
Hemoglobin: 10.3 g/dL — ABNORMAL LOW (ref 12.0–15.0)

## 2021-11-12 LAB — DIC (DISSEMINATED INTRAVASCULAR COAGULATION)PANEL
D-Dimer, Quant: 1.48 ug/mL-FEU — ABNORMAL HIGH (ref 0.00–0.50)
Fibrinogen: 285 mg/dL (ref 210–475)
INR: 1.3 — ABNORMAL HIGH (ref 0.8–1.2)
Platelets: 136 10*3/uL — ABNORMAL LOW (ref 150–400)
Prothrombin Time: 16.1 seconds — ABNORMAL HIGH (ref 11.4–15.2)
Smear Review: NONE SEEN
aPTT: 30 seconds (ref 24–36)

## 2021-11-12 LAB — CBC
HCT: 30.2 % — ABNORMAL LOW (ref 36.0–46.0)
Hemoglobin: 10.4 g/dL — ABNORMAL LOW (ref 12.0–15.0)
MCH: 32.5 pg (ref 26.0–34.0)
MCHC: 34.4 g/dL (ref 30.0–36.0)
MCV: 94.4 fL (ref 80.0–100.0)
Platelets: 134 10*3/uL — ABNORMAL LOW (ref 150–400)
RBC: 3.2 MIL/uL — ABNORMAL LOW (ref 3.87–5.11)
RDW: 12.7 % (ref 11.5–15.5)
WBC: 26.1 10*3/uL — ABNORMAL HIGH (ref 4.0–10.5)
nRBC: 0 % (ref 0.0–0.2)

## 2021-11-12 LAB — APTT: aPTT: 30 seconds (ref 24–36)

## 2021-11-12 LAB — FIBRINOGEN: Fibrinogen: 339 mg/dL (ref 210–475)

## 2021-11-12 LAB — PREPARE RBC (CROSSMATCH)

## 2021-11-12 SURGERY — SUTURE REPAIR, LACERATION, PERINEUM
Anesthesia: Epidural | Wound class: Clean Contaminated

## 2021-11-12 MED ORDER — METHYLERGONOVINE MALEATE 0.2 MG PO TABS: 0.2000 mg | ORAL_TABLET | Freq: Once | ORAL | Status: DC

## 2021-11-12 MED ORDER — TRANEXAMIC ACID-NACL 1000-0.7 MG/100ML-% IV SOLN
INTRAVENOUS | Status: AC
  Filled 2021-11-12: qty 100

## 2021-11-12 MED ORDER — COCONUT OIL OIL
1.0000 | TOPICAL_OIL | Status: DC | PRN
  Administered 2021-11-12: 1 via TOPICAL

## 2021-11-12 MED ORDER — ACETAMINOPHEN 160 MG/5ML PO SOLN
650.0000 mg | Freq: Four times a day (QID) | ORAL | Status: DC | PRN
  Administered 2021-11-12 (×2): 650 mg via ORAL
  Filled 2021-11-12 (×2): qty 20.3

## 2021-11-12 MED ORDER — SODIUM CHLORIDE 0.9% IV SOLUTION: Freq: Once | INTRAVENOUS | Status: DC

## 2021-11-12 MED ORDER — TRANEXAMIC ACID-NACL 1000-0.7 MG/100ML-% IV SOLN
1000.0000 mg | INTRAVENOUS | Status: AC
Start: 2021-11-12 — End: 2021-11-12

## 2021-11-12 MED ORDER — ONDANSETRON HCL 4 MG/2ML IJ SOLN: 4.0000 mg | INTRAMUSCULAR | Status: DC | PRN

## 2021-11-12 MED ORDER — SODIUM BICARBONATE 8.4 % IV SOLN
INTRAVENOUS | Status: DC | PRN
  Administered 2021-11-12: 10 mL via EPIDURAL

## 2021-11-12 MED ORDER — DEXMEDETOMIDINE HCL IN NACL 200 MCG/50ML IV SOLN
INTRAVENOUS | Status: DC | PRN
  Administered 2021-11-12: 8 ug via INTRAVENOUS

## 2021-11-12 MED ORDER — ONDANSETRON HCL 4 MG PO TABS: 4.0000 mg | ORAL_TABLET | ORAL | Status: DC | PRN

## 2021-11-12 MED ORDER — SENNOSIDES-DOCUSATE SODIUM 8.6-50 MG PO TABS
2.0000 | ORAL_TABLET | ORAL | Status: DC
  Administered 2021-11-12 – 2021-11-14 (×3): 2 via ORAL
  Filled 2021-11-12 (×3): qty 2

## 2021-11-12 MED ORDER — SIMETHICONE 80 MG PO CHEW: 80.0000 mg | CHEWABLE_TABLET | ORAL | Status: DC | PRN

## 2021-11-12 MED ORDER — OXYCODONE HCL 5 MG PO TABS
5.0000 mg | ORAL_TABLET | ORAL | Status: DC | PRN
  Administered 2021-11-12 – 2021-11-14 (×8): 10 mg via ORAL
  Filled 2021-11-12 (×8): qty 2

## 2021-11-12 MED ORDER — TRANEXAMIC ACID-NACL 1000-0.7 MG/100ML-% IV SOLN
INTRAVENOUS | Status: AC
  Administered 2021-11-12: 1000 mg via INTRAVENOUS
  Filled 2021-11-12: qty 100

## 2021-11-12 MED ORDER — IBUPROFEN 600 MG PO TABS: 600.0000 mg | ORAL_TABLET | Freq: Four times a day (QID) | ORAL | Status: DC

## 2021-11-12 MED ORDER — RHO D IMMUNE GLOBULIN 1500 UNIT/2ML IJ SOSY
300.0000 ug | PREFILLED_SYRINGE | Freq: Once | INTRAMUSCULAR | Status: AC
Start: 2021-11-12 — End: 2021-11-12
  Administered 2021-11-12: 300 ug via INTRAVENOUS
  Filled 2021-11-12: qty 2

## 2021-11-12 MED ORDER — PRENATAL MULTIVITAMIN CH
1.0000 | ORAL_TABLET | Freq: Every day | ORAL | Status: DC
  Administered 2021-11-12 – 2021-11-13 (×2): 1 via ORAL
  Filled 2021-11-12 (×2): qty 1

## 2021-11-12 MED ORDER — BENZOCAINE-MENTHOL 20-0.5 % EX AERO
1.0000 | INHALATION_SPRAY | CUTANEOUS | Status: DC | PRN
  Administered 2021-11-12: 1 via TOPICAL
  Filled 2021-11-12: qty 56

## 2021-11-12 MED ORDER — ALBUMIN HUMAN 5 % IV SOLN
INTRAVENOUS | Status: AC
  Filled 2021-11-12: qty 250

## 2021-11-12 MED ORDER — SODIUM CHLORIDE 0.9 % IV SOLN: INTRAVENOUS | Status: DC | PRN

## 2021-11-12 MED ORDER — FENTANYL CITRATE (PF) 100 MCG/2ML IJ SOLN
INTRAMUSCULAR | Status: DC | PRN
Start: 2021-11-12 — End: 2021-11-12
  Administered 2021-11-12: 100 ug via EPIDURAL

## 2021-11-12 MED ORDER — PHENYLEPHRINE 80 MCG/ML (10ML) SYRINGE FOR IV PUSH (FOR BLOOD PRESSURE SUPPORT)
PREFILLED_SYRINGE | INTRAVENOUS | Status: AC
  Filled 2021-11-12: qty 10

## 2021-11-12 MED ORDER — ACETAMINOPHEN 325 MG PO TABS: 650.0000 mg | ORAL_TABLET | ORAL | Status: DC | PRN

## 2021-11-12 MED ORDER — ONDANSETRON HCL 4 MG/2ML IJ SOLN
INTRAMUSCULAR | Status: AC
  Filled 2021-11-12: qty 2

## 2021-11-12 MED ORDER — LIDOCAINE-EPINEPHRINE (PF) 2 %-1:200000 IJ SOLN
INTRAMUSCULAR | Status: DC | PRN
  Administered 2021-11-12 (×3): 5 mL via INTRADERMAL

## 2021-11-12 MED ORDER — DIBUCAINE (PERIANAL) 1 % EX OINT: 1.0000 | TOPICAL_OINTMENT | CUTANEOUS | Status: DC | PRN

## 2021-11-12 MED ORDER — DIPHENHYDRAMINE HCL 25 MG PO CAPS: 25.0000 mg | ORAL_CAPSULE | Freq: Four times a day (QID) | ORAL | Status: DC | PRN

## 2021-11-12 MED ORDER — ALBUMIN HUMAN 5 % IV SOLN: INTRAVENOUS | Status: DC | PRN

## 2021-11-12 MED ORDER — ONDANSETRON HCL 4 MG/2ML IJ SOLN
INTRAMUSCULAR | Status: DC | PRN
  Administered 2021-11-12: 4 mg via INTRAVENOUS

## 2021-11-12 MED ORDER — IBUPROFEN 100 MG/5ML PO SUSP
600.0000 mg | Freq: Four times a day (QID) | ORAL | Status: DC
  Administered 2021-11-12 – 2021-11-14 (×9): 600 mg via ORAL
  Filled 2021-11-12 (×9): qty 30

## 2021-11-12 MED ORDER — METHYLERGONOVINE MALEATE 0.2 MG/ML IJ SOLN
INTRAMUSCULAR | Status: AC
  Administered 2021-11-12: 0.2 mg
  Filled 2021-11-12: qty 1

## 2021-11-12 MED ORDER — WITCH HAZEL-GLYCERIN EX PADS: 1.0000 | MEDICATED_PAD | CUTANEOUS | Status: DC | PRN

## 2021-11-12 MED ORDER — FENTANYL CITRATE (PF) 100 MCG/2ML IJ SOLN
INTRAMUSCULAR | Status: AC
  Filled 2021-11-12: qty 2

## 2021-11-12 MED ORDER — TETANUS-DIPHTH-ACELL PERTUSSIS 5-2.5-18.5 LF-MCG/0.5 IM SUSY: 0.5000 mL | PREFILLED_SYRINGE | Freq: Once | INTRAMUSCULAR | Status: DC

## 2021-11-12 MED ORDER — PHENYLEPHRINE 80 MCG/ML (10ML) SYRINGE FOR IV PUSH (FOR BLOOD PRESSURE SUPPORT)
PREFILLED_SYRINGE | INTRAVENOUS | Status: DC | PRN
  Administered 2021-11-12: 160 ug via INTRAVENOUS

## 2021-11-12 MED ORDER — OXYTOCIN 10 UNIT/ML IJ SOLN: INTRAMUSCULAR | Status: DC | PRN

## 2021-11-12 MED ORDER — DEXMEDETOMIDINE HCL IN NACL 80 MCG/20ML IV SOLN
INTRAVENOUS | Status: AC
  Filled 2021-11-12: qty 20

## 2021-11-12 MED ORDER — TRANEXAMIC ACID-NACL 1000-0.7 MG/100ML-% IV SOLN
1000.0000 mg | INTRAVENOUS | Status: AC
  Administered 2021-11-12: 1000 mg via INTRAVENOUS

## 2021-11-12 MED ORDER — MEASLES, MUMPS & RUBELLA VAC IJ SOLR: 0.5000 mL | Freq: Once | INTRAMUSCULAR | Status: DC

## 2021-11-12 SURGICAL SUPPLY — 16 items
CANISTER SUCT 3000ML PPV (MISCELLANEOUS) ×1 IMPLANT
GLOVE SRG 8 PF TXTR STRL LF DI (GLOVE) IMPLANT
GLOVE SURG ORTHO LTX SZ8 (GLOVE) ×3 IMPLANT
GLOVE SURG UNDER POLY LF SZ8 (GLOVE) ×1
GOWN STRL REUS TWL 2XL XL LVL4 (GOWN DISPOSABLE) ×1 IMPLANT
GOWN STRL REUS W/ TWL LRG LVL3 (GOWN DISPOSABLE) IMPLANT
GOWN STRL REUS W/TWL LRG LVL3 (GOWN DISPOSABLE) ×1
NDL MAYO CATGUT SZ4 TPR NDL (NEEDLE) IMPLANT
NEEDLE MAYO CATGUT SZ4 (NEEDLE) ×2 IMPLANT
PACK VAGINAL MINOR WOMEN LF (CUSTOM PROCEDURE TRAY) ×1 IMPLANT
PAD OB MATERNITY 4.3X12.25 (PERSONAL CARE ITEMS) ×1 IMPLANT
PAD PREP 24X48 CUFFED NSTRL (MISCELLANEOUS) ×1 IMPLANT
SUT VIC AB 1 CT1 36 (SUTURE) ×3 IMPLANT
TOWEL OR 17X24 6PK STRL BLUE (TOWEL DISPOSABLE) ×2 IMPLANT
TUBING NON-CON 1/4 X 20 CONN (TUBING) ×1 IMPLANT
YANKAUER SUCT BULB TIP NO VENT (SUCTIONS) ×1 IMPLANT

## 2021-11-12 NOTE — Lactation Note (Signed)
This note was copied from a baby's chart. Lactation Consultation Note  Patient Name: Haley Kramer [id number] Date: 11/12/2021 Reason for consult: (P) Initial assessment;NICU baby Age:36 hours   LC Note:  RN called: she desires to initiate the electric pump for mother, however, no pumps available on [facility] at this time.  Provided a pump with instructions to RN to be sure to return to 4th floor.  Supplies obtained and entered mother's room to assist with pumping.  Mother appreciative but desires to wait until later morning due to being extremely tired.  Acknowledged her request and asked her to call her RN when ready to begin pumping.  Support person at bedside.  RN updated.   Maternal Data    Feeding Mother's Current Feeding Choice: (P) Breast Milk  LATCH Score                    Lactation Tools Discussed/Used    Interventions    Discharge    Consult Status      Haley Kramer 11/12/2021, 5:50 AM

## 2021-11-12 NOTE — Procedures (Addendum)
Preoperative Diagnosis: vaginal laceration with postpartum hemorrhage  Postoperative Diagnosis: same  Procedure(s) Performed: repair of vaginal laceration under anesthesia  Surgeon: Mariel Aloe, MD  Assistant Surgeon: n/a  Anesthesia:epidural  Indication for Procedure: continued post delivery vaginal bleeding  Operative Findings: left vaginal bed bleeding, bilateral labial laceration   Procedure:Pt was taken to the OB OR where epidural anesthesia was used for pain control.  The patient was prepped and draped in usual sterile fashion.  With the suction, lighting and Improved exposure the bleeding area was visualized in the left vaginal bed and secured using 2 figure of eight stitches of 2-0 vicryl.  The vagina was monitored for 3-5 minutes and no further bleeding was noted.  Due to maternal tachycardia and some initial hypotension, the patient was typed and crossed for 2 units PRBC and a DIC panel was drawn.  Results are pending at the time of this writing.  Specimens: none  Estimated Blood Loss: 862 mL.  IVF: 1500 ml 25o ml albumin Blood Replacement: pt typed and crossed for 2 units  Urine Output:   Sponge, lap and needle counts were correct x 3.    Complications: none immediate  The patient had sequential compression devices for VTE prophylaxis and will receive Lovenox postoperatively 12-18 hours post delivery.           Disposition: PACU - hemodynamically stable.         Condition: stable   Mariel Aloe, MD Faculty attending, [facility] [employer]

## 2021-11-12 NOTE — Lactation Note (Signed)
This note was copied from a baby's chart. Lactation Consultation Note Mother in pain at time of LC visit. There was a pump set up in room. Mother requested to delay visit. LC left written materials and relayed visit details to RN. No charge.   Patient Name: Haley Kramer [name] Date: 11/12/2021   Age:36 hours   [name] 11/12/2021, 10:31 AM

## 2021-11-12 NOTE — Transfer of Care (Signed)
Immediate Anesthesia Transfer of Care Note  Patient: Haley Kramer  Procedure(s) Performed: SUTURE REPAIR PERINEAL LACERATION  Patient Location: PACU  Anesthesia Type:Epidural  Level of Consciousness: awake, alert  and oriented  Airway & Oxygen Therapy: Patient Spontanous Breathing  Post-op Assessment: Report given to RN and Post -op Vital signs reviewed and stable  Post vital signs: Reviewed and stable  Last Vitals:  Vitals Value Taken Time  BP 142/75 11/12/21 0230  Temp 37.2 C 11/12/21 0208  Pulse 117 11/12/21 0233  Resp 19 11/12/21 0233  SpO2 100 % 11/12/21 0233  Vitals shown include unvalidated device data.  Last Pain:  Vitals:   11/12/21 0215  TempSrc:   PainSc: 0-No pain      Patients Stated Pain Goal: 0 (11/11/21 0630)  Complications: No notable events documented.

## 2021-11-12 NOTE — Progress Notes (Signed)
POSTPARTUM PROGRESS NOTE  Post Partum Day 0  Subjective:  Haley Kramer is a 36 y.o. [id number] s/p vacuum assisted vaginal delivery at [redacted]w[redacted]d.  She reports she is doing well. She is recovering from vaginal laceration repair in the OR. Foley catheter is still in place due to previous hemorrhage. Denies nausea or vomiting.  Pain is moderately controlled.  Lochia is normal.  Objective: Blood pressure 128/75, pulse (!) 115, temperature 98 F (36.7 C), temperature source Oral, resp. rate 20, height 5\' 7"  (1.702 m), weight 103.1 kg, last menstrual period 02/25/2021, SpO2 99 %, unknown if currently breastfeeding.  Physical Exam:  General: alert, cooperative and no distress Chest: no respiratory distress, lungs clear to auscultation bilaterally Heart: regular rate and rhythm Abdomen: soft, nontender, nondistended, obese Uterine Fundus: firm, appropriately tender DVT Evaluation: No calf swelling or tenderness Extremities: minimal edema Skin: warm, dry  Recent Labs    11/12/21 0155 11/12/21 0713  HGB 10.3* 10.4*  HCT 30.8* 30.2*    Assessment/Plan: Haley Kramer is a 36 y.o. 31 s/p vacuum assisted vaginal delivery  at [redacted]w[redacted]d   PPD#0 - Doing well  Continue to follow up on recent coags and AM CBC to eval h/h and platelets If labs are stable, will remove foley catheter around noon.   LOS: 1 day   [redacted]w[redacted]d, MD Faculty attending 11/12/2021, 7:42 AM

## 2021-11-12 NOTE — Discharge Summary (Signed)
Postpartum Discharge Summary  Date of Service updated***     Patient Name: Haley Kramer DOB: 15-Feb-1986 MRN: [id number]  Date of admission: 11/11/2021 Delivery date:11/12/2021  Delivering provider: Julianne Handler  Date of discharge: 11/12/2021  Admitting diagnosis: Polyhydramnios affecting pregnancy in third trimester [O40.3XX0] Intrauterine pregnancy: [redacted]w[redacted]d     Secondary diagnosis:  Principal Problem:   Polyhydramnios affecting pregnancy in third trimester Active Problems:   Benign hypertension   Supervision of high risk pregnancy, antepartum   Anemia in pregnancy   AMA (advanced maternal age) multigravida 35+   History of C-section   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 yo G3P1011 at [redacted]w[redacted]d was admitted to the hospital 11/11/2021 for induction of labor.  Indication for induction:  Severe Polyhydramnios .  Patient had an uncomplicated labor course as follows: Membrane Rupture Time/Date: 4:45 PM ,11/11/2021   Delivery Method:Vaginal, Vacuum (Extractor)  Episiotomy: None  Lacerations:  1st degree;Labial  Pt was transferred to OR for repair. Details of delivery can be found in separate delivery note.  Patient had a routine postpartum course. Patient is discharged home 11/12/21.  Newborn Data: Birth date:11/12/2021  Birth time:12:00 AM  Gender:Female  Living status:Living  Apgars:2 ,3  Weight:2600 g   Magnesium Sulfate received: No BMZ received: No Rhophylac:{Rhophylac  received:30440032} MMR:N/A T-DaP:Given prenatally Flu: {NOI:37048} Transfusion:{Transfusion received:30440034}  Physical exam  Vitals:   11/11/21 2330 11/12/21 0030 11/12/21 0045 11/12/21 0047  BP: 127/64 140/83 (!) 138/91   Pulse: 100 (!) 103 (!) 117   Resp:      Temp:    99 F (37.2 C)  TempSrc:    Oral  SpO2:  97% 98%   Weight:      Height:       General: {Exam; general:21111117} Lochia: {Desc; appropriate/inappropriate:30686::"appropriate"} Uterine Fundus: {Desc; firm/soft:30687} Incision: {Exam; incision:21111123} DVT Evaluation: {Exam; dvt:2111122} Labs: Lab Results  Component Value Date   WBC 23.5 (H) 11/11/2021   HGB 12.8 11/11/2021   HCT 38.5 11/11/2021   MCV 97.0 11/11/2021   PLT 182 11/11/2021      Latest Ref Rng & Units 11/11/2021   12:50 AM  CMP  Glucose 70 - 99 mg/dL 77   BUN 6 - 20 mg/dL 6   Creatinine 0.44 - 1.00 mg/dL 0.44   Sodium 135 - 145 mmol/L 137   Potassium 3.5 - 5.1 mmol/L 3.6   Chloride 98 - 111 mmol/L 110   CO2 22 - 32 mmol/L 19   Calcium 8.9 - 10.3 mg/dL 8.5   Total Protein 6.5 - 8.1 g/dL 5.8   Total Bilirubin 0.3 - 1.2 mg/dL 1.0   Alkaline Phos 38 - 126 U/L 127   AST 15 - 41 U/L 25   ALT 0 - 44 U/L 14    Edinburgh Score:     No data to display  After visit meds:  Allergies as of 11/12/2021   No Known Allergies   Med Rec must be completed prior to using this [facility]***        Discharge home in stable condition Infant Feeding: {Baby feeding:23562} Infant Disposition:{CHL IP OB HOME WITH VOHCSP:19802} Discharge instruction: per After Visit Summary and Postpartum booklet. Activity: Advance as tolerated. Pelvic rest for 6 weeks.  Diet: {OB CHTV:81025486} Future Appointments: Future Appointments  Date Time Provider [place]  11/13/2021  1:55 PM Jacob Moores [facility]  11/20/2021  2:15 PM Aletha Halim, MD [facility]  11/27/2021  8:15 AM Radene Gunning, MD [facility]  12/04/2021  3:15 PM  [facility]  12/04/2021  4:15 PM Radene Gunning, MD [facility]  02/12/2022  1:45 PM [facility] None  02/14/2022  1:00 PM Sindy Guadeloupe, MD [facility] None   Follow up Visit:   Please schedule this patient for a In person postpartum visit in 6 weeks with the following provider: Any provider. Additional Postpartum F/U:BP check 1 week  High risk pregnancy complicated by: HTN Delivery mode:  Vaginal, Vacuum Neurosurgeon)  Anticipated Birth Control:  IUD   11/12/2021 Julianne Handler, CNM

## 2021-11-12 NOTE — Anesthesia Postprocedure Evaluation (Signed)
Anesthesia Post Note  Patient: Haley Kramer  Procedure(s) Performed: SUTURE REPAIR PERINEAL LACERATION     Patient location during evaluation: Mother Baby Anesthesia Type: Epidural Level of consciousness: awake and alert and oriented Pain management: satisfactory to patient Vital Signs Assessment: post-procedure vital signs reviewed and stable Respiratory status: respiratory function stable Cardiovascular status: stable Postop Assessment: no headache, no backache, epidural receding, patient able to bend at knees, no signs of nausea or vomiting, adequate PO intake and able to ambulate Anesthetic complications: no   No notable events documented.  Last Vitals:  Vitals:   11/12/21 0431 11/12/21 0757  BP: 128/75 120/69  Pulse: (!) 115 74  Resp: 20 14  Temp: 36.7 C 36.8 C  SpO2: 99% 99%    Last Pain:  Vitals:   11/12/21 0810  TempSrc:   PainSc: 8    Pain Goal: Patients Stated Pain Goal: 3 (11/12/21 0810)                 Karleen Dolphin

## 2021-11-12 NOTE — Op Note (Signed)
Preoperative Diagnosis: vaginal laceration with postpartum hemorrhage  Postoperative Diagnosis: same  Procedure(s) Performed: repair of vaginal laceration under anesthesia  Surgeon: Brindley Madarang, MD  Assistant Surgeon: n/a  Anesthesia:epidural  Indication for Procedure: continued post delivery vaginal bleeding  Operative Findings: left vaginal bed bleeding, bilateral labial laceration   Procedure:Pt was taken to the OB OR where epidural anesthesia was used for pain control.  The patient was prepped and draped in usual sterile fashion.  With the suction, lighting and Improved exposure the bleeding area was visualized in the left vaginal bed and secured using 2 figure of eight stitches of 2-0 vicryl.  The vagina was monitored for 3-5 minutes and no further bleeding was noted.  Due to maternal tachycardia and some initial hypotension, the patient was typed and crossed for 2 units PRBC and a DIC panel was drawn.  Results are pending at the time of this writing.  Specimens: none  Estimated Blood Loss: 862 mL.  IVF: 1500 ml 25o ml albumin Blood Replacement: pt typed and crossed for 2 units  Urine Output:   Sponge, lap and needle counts were correct x 3.    Complications: none immediate  The patient had sequential compression devices for VTE prophylaxis and will receive Lovenox postoperatively 12-18 hours post delivery.           Disposition: PACU - hemodynamically stable.         Condition: stable   Haley Hakanson, MD Faculty attending, [facility] 

## 2021-11-13 ENCOUNTER — Ambulatory Visit: Payer: Medicaid Other

## 2021-11-13 ENCOUNTER — Telehealth: Payer: Medicaid Other | Admitting: Family Medicine

## 2021-11-13 ENCOUNTER — Encounter (HOSPITAL_COMMUNITY): Payer: Self-pay | Admitting: Family Medicine

## 2021-11-13 LAB — CBC
HCT: 25.8 % — ABNORMAL LOW (ref 36.0–46.0)
Hemoglobin: 8.9 g/dL — ABNORMAL LOW (ref 12.0–15.0)
MCH: 32.6 pg (ref 26.0–34.0)
MCHC: 34.5 g/dL (ref 30.0–36.0)
MCV: 94.5 fL (ref 80.0–100.0)
Platelets: 116 10*3/uL — ABNORMAL LOW (ref 150–400)
RBC: 2.73 MIL/uL — ABNORMAL LOW (ref 3.87–5.11)
RDW: 12.8 % (ref 11.5–15.5)
WBC: 29 10*3/uL — ABNORMAL HIGH (ref 4.0–10.5)
nRBC: 0 % (ref 0.0–0.2)

## 2021-11-13 LAB — RH IG WORKUP (INCLUDES ABO/RH)
Fetal Screen: NEGATIVE
Gestational Age(Wks): 37
Unit division: 0

## 2021-11-13 MED ORDER — FERROUS GLUCONATE 324 (38 FE) MG PO TABS
324.0000 mg | ORAL_TABLET | ORAL | Status: DC
  Administered 2021-11-13: 324 mg via ORAL
  Filled 2021-11-13: qty 1

## 2021-11-13 NOTE — Lactation Note (Signed)
This note was copied from a baby's chart.  NICU Lactation Consultation Note  Patient Name: Haley Kramer IRCVE'L Date: 11/13/2021 Age:36 hours  Subjective Reason for consult: Initial assessment; NICU baby; Infant < 6lbs; Early term 37-38.6wks; Other (Comment) (AMA, Hx of bariatric surgery)  Visited with mom of 35 hours old ETI NICU female, she's a P2 and experienced breastfeeding. Ms. Plaia has not initiated pumping yet, she voiced she's feeling unwell but that she'll start pumping as soon as she feels better. Reviewed pumping schedule, pumping long, lactogenesis II, anticipatory guidelines and the importance of continuing taking all her vitamins and supplements due to her Hx of bariatric surgery. Her plan is to do both, breast and formula, baby is currently on Similac 22 calorie formula. She doesn't have a pump for home use, WIC referral was faxed to [facility].   Objective Infant data: Mother's Current Feeding Choice: Formula  Infant feeding assessment Scale for Readiness: 3  Maternal data: [id number]  VBAC, Vacuum Assisted Significant Breast History:: moderate changes Current breast feeding challenges:: NICU admission Does the patient have breastfeeding experience prior to this delivery?: Yes How long did the patient breastfeed?: 9 months Pumping frequency: hasn't started yet, recommended q 3 hours Pumped volume: 0 mL Risk factor for low milk supply:: infant separation, AMA WIC Program: Yes WIC Referral Sent?: Yes  Assessment Infant: In NICU  Maternal: Denies pain/discomfort at the breast  Intervention/Plan Interventions: Breast feeding basics reviewed; Coconut oil; DEBP; Education; "The NICU and Your Baby" book; LC Services brochure Tools: Pump Pump Education: Setup, frequency, and cleaning; Milk Storage  Plan of care: Encouraged mom to start pumping every 3 hours, at least 8 pumping sessions/24 hours Breast massage, hand expression and coconut oil were also  encouraged prior pumping  FOB present. All questions and concerns answered, family to contact [facility] services PRN.  Consult Status: NICU follow-up NICU Follow-up type: Maternal D/C visit; Verify onset of copious milk; Verify absence of engorgement   Haley Kramer 11/13/2021, 10:25 AM

## 2021-11-13 NOTE — Progress Notes (Signed)
Circumcision Consent  Discussed with mom at bedside about circumcision.   Circumcision is a surgery that removes the skin that covers the tip of the penis, called the "foreskin." Circumcision is usually done when a boy is between 27 and 38 days old, sometimes up to 50-37 weeks old.  The most common reasons boys are circumcised include for cultural/religious beliefs or for parental preference (potentially easier to clean, so baby looks like daddy, etc).  There may be some medical benefits for circumcision:   Circumcised boys seem to have slightly lower rates of: ? Urinary tract infections (per the American Academy of Pediatrics an uncircumcised boy has a 1/100 chance of developing a UTI in the first year of life, a circumcised boy at a 05/998 chance of developing a UTI in the first year of life- a 10% reduction) ? Penis cancer (typically rare- an uncircumcised female has a 1 in 100,000 chance of developing cancer of the penis) ? Sexually transmitted infection (in endemic areas, including HIV, HPV and Herpes- circumcision does NOT protect against gonorrhea, chlamydia, trachomatis, or syphilis) ? Phimosis: a condition where that makes retraction of the foreskin over the glans impossible (0.4 per 1000 boys per year or 0.6% of boys are affected by their 15th birthday)  Boys and men who are not circumcised can reduce these extra risks by: ? Cleaning their penis well ? Using condoms during sex  What are the risks of circumcision?  As with any surgical procedure, there are risks and complications. In circumcision, complications are rare and usually minor, the most common being: ? Bleeding- risk is reduced by holding each clamp for 30 seconds prior to a cut being made, and by holding pressure after the procedure is done ? Infection- the penis is cleaned prior to the procedure, and the procedure is done under sterile technique ? Damage to the urethra or amputation of the penis  How is circumcision done  in baby boys?  The baby will be placed on a special table and the legs restrained for their safety. Numbing medication is injected into the penis, and the skin is cleansed with betadine to decrease the risk of infection.   All questions were answered and mother consented.  Myna Hidalgo, DO Attending Obstetrician & Gynecologist, [facility] for [employer], [facility]

## 2021-11-13 NOTE — Progress Notes (Signed)
Post Partum Day 1 Subjective: Doing ok- having issues with movement and pain management.  Taking RTC medication, which does seem to help.  Lochia appropriate.  She is eating, drinking, and voiding without issue. Not yet passing flatus, no BM.  As mentioned some issues with ambulation due to pain.  Plans on breastfeeding, baby currently in NICU.  Objective: Blood pressure 121/78, pulse 88, temperature 97.9 F (36.6 C), temperature source Oral, resp. rate 17, height 5\' 7"  (1.702 m), weight 103.1 kg, last menstrual period 02/25/2021, SpO2 99 %, unknown if currently breastfeeding.  Physical Exam:  General: alert, cooperative, and no distress Lochia: appropriate Uterine Fundus: firm, below umbilicus, appropriately tender Incision: n/a DVT Evaluation: No evidence of DVT seen on physical exam.  Recent Labs    11/12/21 0713 11/13/21 0511  HGB 10.4* 8.9*  HCT 30.2* 25.8*    Assessment/Plan: Haley Kramer is a 36 y.o. 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   VSS. Continue routine postpartum care.  -chronic HTN- lasix daily x 5 days, BP within normal range  Feeding:breast Contraception: outpatient IUD Circumcision: to be completed  Dispo: Continue routine postpartum care   LOS: 2 days   31, DO  11/13/2021, 7:29 AM

## 2021-11-13 NOTE — Clinical Social Work Maternal (Signed)
CLINICAL SOCIAL WORK MATERNAL/CHILD NOTE  Patient Details  Name: MIEKA LEATON MRN: [id number] Date of Birth: 13-Jun-1985  Date:  11/13/2021  Clinical Social Worker Initiating Note:  Laurey Arrow Date/Time: Initiated:  11/13/21/1034     Child's Name:  Edwyna Shell   Biological Parents:  Mother, Father (FOB is Elizbeth Squires 02/24/1982)   Need for Interpreter:  None   Reason for Referral:      Address:  [place] [address]    Phone number:  [phone] (home)     Additional phone number: FOB's number is [phone]  Household Members/Support Persons (HM/SP):   Household Member/Support Person 1 (MOB communciated that she resides with her mother.)   HM/SP Name Relationship DOB or Age  HM/SP -1 McKenzie Macdonnell daughter 03/23/2011  HM/SP -2        HM/SP -3        HM/SP -4        HM/SP -5        HM/SP -6        HM/SP -7        HM/SP -8          Natural Supports (not living in the home):  Immediate Family, Extended Family, Spouse/significant other (MOB reported that FOB's family will also provide support.)   Professional Supports: Therapist Therapist, art wit [facility])   Employment: Unemployed   Type of Work:     Education:  [place] arranged:    Museum/gallery curator Resources:  [employer]   Other Resources:  [employer], Physicist, medical     Cultural/Religious Considerations Which May Impact Care:  Per [employer], MOB is Engineer, manufacturing.  Strengths:  Ability to meet basic needs  , Pediatrician chosen, Home prepared for child     Psychotropic Medications:         Pediatrician:    [facility]olic[facility] area  Pediatrician List:   Youth worker ([facility])  [place]      Pediatrician Fax Number:    Risk Factors/Current Problems:  Mental Health Concerns     Cognitive State:  Able to Concentrate  , Alert  , Insightful  , Goal Oriented  , Linear  Thinking     Mood/Affect:  Relaxed  , Bright  , Happy  , Interested  , Calm  , Comfortable     CSW Assessment: CSW met with MOB in room 109 to introduce myself, offer support and complete assessment due to NICU admission at 37.1 weeks with a hx  of depression.  MOB was pleasant and receptive to CSW intervention.  She reports feeling well today and seems to have a good understanding of baby's medical situation at this time as she was able to update CSW on his status.   CSW asked MOB to share her story of labor and delivery as well as baby's admission to NICU and how she felt emotionally throughout her experience.  MOB was open to talking with CSW and sharing her feelings.  She states baby's admission to NICU was "good."  MOB communicated that she knows that infant is getting the proper care that he needs.  MOB is keeping this (necessary care baby is receiving) as her focus and acknowledges the situation as temporary.  CSW assisted her in identifying strengths, which she was able to.  She is pleased with baby's progress.  CSW provided education regarding PPD signs and symptoms to watch for and asked that MOB commit to talking with her therapist at [facility], and or her doctor if symptoms arise at any time; She agreed.  MOB expressed a desire to start medication and she plans to speak with her provider to today or prior to her discharging. CSW  discussed common emotions often experienced during the first two weeks after delivery, keeping in mind the separation that is inevitably caused by baby's admission to NICU.  MOB acknowledged a hx of depression in 2012 and attributed her symptoms to having a poor relationship with her oldest child's father. MOB states she has a good support system and names FOB and their immediate family as the main support people.    MOB reports having all needed baby supplies at home and has chosen a pediatrician.  She states no issues with transportation.   MOB states no further  questions, concerns or needs at this time.  CSW explained ongoing support services offered by NICU CSW and provided contact information.  MOB seemed very appreciative of the visit and thanked CSW.  CSW will continue to offer resources and supports to family while infant remains in NICU.    CSW Plan/Description:  Psychosocial Support and Ongoing Assessment of Needs, Sudden Infant Death Syndrome (SIDS) Education, Perinatal Mood and Anxiety Disorder (PMADs) Education, Other Patient/Family Education, Other Information/Referral to [employer], MSW, [employer] Clinical Social Work [phone]  Dimple Nanas, LCSW 11/13/2021, 10:43 AM

## 2021-11-14 LAB — TYPE AND SCREEN
ABO/RH(D): O NEG
Antibody Screen: POSITIVE
Unit division: 0
Unit division: 0

## 2021-11-14 LAB — BPAM RBC
Blood Product Expiration Date: 202307062359
Blood Product Expiration Date: 202307062359
ISSUE DATE / TIME: 202307040137
ISSUE DATE / TIME: 202307040137
Unit Type and Rh: 9500
Unit Type and Rh: 9500

## 2021-11-14 LAB — SURGICAL PATHOLOGY

## 2021-11-14 MED ORDER — SENNOSIDES-DOCUSATE SODIUM 8.6-50 MG PO TABS: 2.0000 | ORAL_TABLET | Freq: Every evening | ORAL | 2 refills | Status: DC | PRN

## 2021-11-14 MED ORDER — OXYCODONE HCL 5 MG/5ML PO SOLN
5.0000 mg | Freq: Four times a day (QID) | ORAL | 0 refills | Status: DC | PRN
Start: 2021-11-14 — End: 2021-11-15

## 2021-11-14 MED ORDER — IBUPROFEN 100 MG/5ML PO SUSP: 600.0000 mg | Freq: Four times a day (QID) | ORAL | 2 refills | Status: DC | PRN

## 2021-11-14 MED ORDER — SERTRALINE HCL 50 MG PO TABS
50.0000 mg | ORAL_TABLET | Freq: Every day | ORAL | Status: DC
  Administered 2021-11-14: 50 mg via ORAL
  Filled 2021-11-14: qty 1

## 2021-11-14 MED ORDER — FERROUS GLUCONATE 324 (38 FE) MG PO TABS: 324.0000 mg | ORAL_TABLET | ORAL | 2 refills | Status: DC

## 2021-11-14 MED ORDER — SERTRALINE HCL 50 MG PO TABS
50.0000 mg | ORAL_TABLET | Freq: Every day | ORAL | 2 refills | Status: DC
Start: 2021-11-14 — End: 2021-12-06

## 2021-11-14 MED ORDER — CYCLOBENZAPRINE HCL 10 MG PO TABS: 10.0000 mg | ORAL_TABLET | Freq: Three times a day (TID) | ORAL | 2 refills | Status: DC | PRN

## 2021-11-14 MED ORDER — ACETAMINOPHEN 500 MG PO TABS: 1000.0000 mg | ORAL_TABLET | Freq: Four times a day (QID) | ORAL | 0 refills | Status: DC | PRN

## 2021-11-14 NOTE — Plan of Care (Signed)

## 2021-11-15 ENCOUNTER — Telehealth: Payer: Self-pay

## 2021-11-15 ENCOUNTER — Telehealth (HOSPITAL_COMMUNITY): Payer: Self-pay | Admitting: *Deleted

## 2021-11-15 ENCOUNTER — Other Ambulatory Visit: Payer: Self-pay | Admitting: Obstetrics & Gynecology

## 2021-11-15 DIAGNOSIS — Z1331 Encounter for screening for depression: Secondary | ICD-10-CM

## 2021-11-15 DIAGNOSIS — O9089 Other complications of the puerperium, not elsewhere classified: Secondary | ICD-10-CM

## 2021-11-15 MED ORDER — OXYCODONE HCL 5 MG PO TABS: 5.0000 mg | ORAL_TABLET | Freq: Four times a day (QID) | ORAL | 0 refills | Status: DC | PRN

## 2021-11-15 NOTE — Telephone Encounter (Signed)
Patient with EPDS of 25 while in hospital. Amb IBH referral made and Dr. Charlotta Newton notified via chart.  Duffy Rhody, RN 11-15-2021 at 10:16am

## 2021-11-15 NOTE — Telephone Encounter (Addendum)
-----   Message from Kennon Portela, [place] sent at 11/15/2021 11:52 AM EDT ----- Regarding: FW: Prior Auth  ----- Message ----- From: Alvin Critchley Sent: 11/15/2021  11:32 AM EDT To: [facility] [place] Clinical Pool Subject: Prior Auth                                     [facility] needs a prior auth for oxyCODONE (ROXICODONE) 5 MG/5ML solution.   She recently was discharged yesterday and is a lot of pain.   [facility] [address] in [place]

## 2021-11-15 NOTE — Telephone Encounter (Signed)
Called pt to follow up. Explained need for PA will take a few days and may or may not be approved. Pt would prefer prescription for tablets. Message sent to prescribing provider (Anyanwu) with update.

## 2021-11-15 NOTE — Progress Notes (Unsigned)
Oxycodone solution was not covered by insurance, tablets requested by patient.  Oxycodone tablets prescribed. Patient informed.   Jaynie Collins, MD

## 2021-11-16 ENCOUNTER — Encounter (HOSPITAL_COMMUNITY): Payer: Self-pay | Admitting: Family Medicine

## 2021-11-19 ENCOUNTER — Ambulatory Visit: Payer: Medicaid Other

## 2021-11-20 ENCOUNTER — Encounter: Payer: Self-pay | Admitting: Obstetrics and Gynecology

## 2021-11-27 ENCOUNTER — Encounter: Payer: Self-pay | Admitting: Obstetrics and Gynecology

## 2021-11-28 NOTE — BH Specialist Note (Signed)
Integrated Behavioral Health via Telemedicine Visit  11/28/2021 Haley Kramer [id number]  Number of Integrated Behavioral Health Clinician visits: 1- Initial Visit  Session Start time: 0919   Session End time: 0951  Total time in minutes: 32   Referring Provider: Nettie Elm, MD Patient/Family location: Home [facility] Provider location: Center for [facility] at [facility]  All persons participating in visit: [name] and [facility] Haley Kramer   Types of Service: Individual psychotherapy and Video visit  I connected with Haley Kramer and/or Haley Kramer's  n/a  via  Telephone or Video Enabled Telemedicine Application  (Video is Caregility application) and verified that I am speaking with the correct person using two identifiers. Discussed confidentiality: Yes   I discussed the limitations of telemedicine and the availability of in person appointments.  Discussed there is a possibility of technology failure and discussed alternative modes of communication if that failure occurs.  I discussed that engaging in this telemedicine visit, they consent to the provision of behavioral healthcare and the services will be billed under their insurance.  Patient and/or legal guardian expressed understanding and consented to Telemedicine visit: Yes   Presenting Concerns: Patient and/or family reports the following symptoms/concerns: Requests sleep medication; unable to stay asleep (even after taking Unisom, sleeping less than 4 hours nightly and exhausted); melatonin worked no better than Unisom; increased feelings of depression and anxiety, attributes to difficulty birth and postpartum experience;  taking Zoloft as prescribed. No SI.  Duration of problem: Increase postpartum; Severity of problem: severe  Patient and/or Family's Strengths/Protective Factors: Social connections, Concrete supports in place (healthy food, safe environments, etc.), and  Sense of purpose  Goals Addressed: Patient will:  Reduce symptoms of: anxiety, depression, insomnia, and stress   Increase knowledge and/or ability of: healthy habits   Demonstrate ability to: Increase healthy adjustment to current life circumstances  Progress towards Goals: Ongoing  Interventions: Interventions utilized:  Medication Monitoring, Sleep Hygiene, Psychoeducation and/or Health Education, and Supportive Reflection Standardized Assessments completed: GAD-7 and PHQ 9  Patient and/or Family Response: Patient agrees with treatment plan.   Assessment: Patient currently experiencing Major depressive disorder, recurrent, severe without psychotic features and Generalized anxiety disorders   Patient may benefit from continued therapeutic interventions.  Plan: Follow up with behavioral health clinician on : Two weeks Behavioral recommendations:  -Continue taking Unisom and Zoloft as currently prescribed; provider may update medication after review -Prioritize healthy self-care daily (regular meals; adequate sleep) as much as able at this time Referral(s): Integrated [employer] (In Clinic)  I discussed the assessment and treatment plan with the patient and/or parent/guardian. They were provided an opportunity to ask questions and all were answered. They agreed with the plan and demonstrated an understanding of the instructions.   They were advised to call back or seek an in-person evaluation if the symptoms worsen or if the condition fails to improve as anticipated.  Rae Lips, LCSW     11/29/2021   11:29 AM 10/22/2021   11:54 AM 09/16/2021    9:48 AM 08/07/2021    8:45 AM 12/19/2020    9:41 AM  Depression screen PHQ 2/9  Decreased Interest 3 2 2 1  0  Down, Depressed, Hopeless 3 2 3 1  0  PHQ - 2 Score 6 4 5 2  0  Altered sleeping 3 3 2 2    Tired, decreased energy 3 3 3 2    Change in appetite 3 1 1  0  Feeling bad or failure about yourself  3 2 3 3     Trouble concentrating 3 3 3 2    Moving slowly or fidgety/restless 1 0 1 1   Suicidal thoughts 0 0 0 0   PHQ-9 Score 22 16 18 12        11/29/2021   11:30 AM 10/22/2021   11:55 AM 09/16/2021    9:48 AM 08/07/2021    8:49 AM  GAD 7 : Generalized Anxiety Score  Nervous, Anxious, on Edge 3 2 3  0  Control/stop worrying 3 3 3 3   Worry too much - different things 3 3 3 3   Trouble relaxing 3 3 3 2   Restless 3 2 2  0  Easily annoyed or irritable 3 3 3 1   Afraid - awful might happen 3 3 3 1   Total GAD 7 Score 21 19 20  10

## 2021-11-29 ENCOUNTER — Other Ambulatory Visit: Payer: Self-pay | Admitting: Obstetrics & Gynecology

## 2021-11-29 ENCOUNTER — Encounter: Payer: Self-pay | Admitting: Obstetrics & Gynecology

## 2021-11-29 ENCOUNTER — Ambulatory Visit (INDEPENDENT_AMBULATORY_CARE_PROVIDER_SITE_OTHER): Payer: Medicaid Other | Admitting: Clinical

## 2021-11-29 DIAGNOSIS — G4700 Insomnia, unspecified: Secondary | ICD-10-CM

## 2021-11-29 DIAGNOSIS — F332 Major depressive disorder, recurrent severe without psychotic features: Secondary | ICD-10-CM

## 2021-11-29 DIAGNOSIS — F411 Generalized anxiety disorder: Secondary | ICD-10-CM

## 2021-11-29 MED ORDER — ZOLPIDEM TARTRATE 5 MG PO TABS: 5.0000 mg | ORAL_TABLET | Freq: Every evening | ORAL | 1 refills | Status: DC | PRN

## 2021-11-29 NOTE — Progress Notes (Signed)
Patient informed our [facility] that she desired sleep medication, antihistamine therapy not working.  Baby is in NICU. She is breastfeeding. Ambien prescribed after consultation with [facility] on call (found out baby is actually at [facility] not [facility]), message sent to patient.   Jaynie Collins, MD

## 2021-12-03 NOTE — BH Specialist Note (Signed)
Integrated Behavioral Health via Telemedicine Visit  12/17/2021 Haley Kramer [id number]  Number of Integrated Behavioral Health Clinician visits: 3- Third Visit  Session Start time: 1048   Session End time: 1059  Total time in minutes: 11   Referring Provider: Nettie Elm, MD Patient/Family location: Home [facility] Provider location: Center for [facility] Healthcare at [facility] for [facility]  All persons participating in visit: [name] and [facility] Haley Kramer   Types of Service: Individual psychotherapy and Video visit  I connected with Haley Kramer and/or Haley Kramer's  n/a  via  Telephone or Video Enabled Telemedicine Application  (Video is Caregility application) and verified that I am speaking with the correct person using two identifiers. Discussed confidentiality: Yes   I discussed the limitations of telemedicine and the availability of in person appointments.  Discussed there is a possibility of technology failure and discussed alternative modes of communication if that failure occurs.  I discussed that engaging in this telemedicine visit, they consent to the provision of behavioral healthcare and the services will be billed under their insurance.  Patient and/or legal guardian expressed understanding and consented to Telemedicine visit: Yes   Presenting Concerns: Patient and/or family reports the following symptoms/concerns: Baby having another surgery tomorrow (possible trach after previous surgery to remove fluid from brain); Ambien worked well the first two nights ; on third day, unable to fall asleep again. Pt requesting: different sleep medication, reschedule pp visit tomorrow d/t baby's surgery Duration of problem: Postpartum; Severity of problem: severe  Patient and/or Family's Strengths/Protective Factors: Social connections, Concrete supports in place (healthy food, safe environments, etc.), and Sense of purpose  Goals  Addressed: Patient will:  Reduce symptoms of: anxiety, depression, insomnia, and stress   Demonstrate ability to: Increase healthy adjustment to current life circumstances  Progress towards Goals: Ongoing  Interventions: Interventions utilized:  Medication Monitoring and Supportive Reflection Standardized Assessments completed: Not Needed  Patient and/or Family Response: Patient agrees with treatment plan.   Assessment: Patient currently experiencing Major depressive disorder, recurrent, severe, without psychotic features and Generalized anxiety disorder.   Patient may benefit from continued therapeutic interventions.  Plan: Follow up with behavioral health clinician on : Call Haley Kramer at [phone], as needed. Behavioral recommendations:  -Continue taking Zoloft and Ambien as prescribed; will be contacted with any change in medication -Continue plan to be at hospital with baby tomorrow Referral(s): Integrated [employer] (In Clinic)  I discussed the assessment and treatment plan with the patient and/or parent/guardian. They were provided an opportunity to ask questions and all were answered. They agreed with the plan and demonstrated an understanding of the instructions.   They were advised to call back or seek an in-person evaluation if the symptoms worsen or if the condition fails to improve as anticipated.  Rae Lips, LCSW     11/29/2021   11:29 AM 10/22/2021   11:54 AM 09/16/2021    9:48 AM 08/07/2021    8:45 AM 12/19/2020    9:41 AM  Depression screen PHQ 2/9  Decreased Interest 3 2 2 1  0  Down, Depressed, Hopeless 3 2 3 1  0  PHQ - 2 Score 6 4 5 2  0  Altered sleeping 3 3 2 2    Tired, decreased energy 3 3 3 2    Change in appetite 3 1 1  0   Feeling bad or failure about yourself  3 2 3 3    Trouble concentrating 3 3 3 2    Moving  slowly or fidgety/restless 1 0 1 1   Suicidal thoughts 0 0 0 0   PHQ-9 Score 22 16 18 12        11/29/2021   11:30 AM  10/22/2021   11:55 AM 09/16/2021    9:48 AM 08/07/2021    8:49 AM  GAD 7 : Generalized Anxiety Score  Nervous, Anxious, on Edge 3 2 3  0  Control/stop worrying 3 3 3 3   Worry too much - different things 3 3 3 3   Trouble relaxing 3 3 3 2   Restless 3 2 2  0  Easily annoyed or irritable 3 3 3 1   Afraid - awful might happen 3 3 3 1   Total GAD 7 Score 21 19 20  10

## 2021-12-04 ENCOUNTER — Encounter: Payer: Self-pay | Admitting: Obstetrics and Gynecology

## 2021-12-04 ENCOUNTER — Other Ambulatory Visit: Payer: Self-pay

## 2021-12-06 ENCOUNTER — Other Ambulatory Visit: Payer: Self-pay | Admitting: Obstetrics & Gynecology

## 2021-12-10 ENCOUNTER — Other Ambulatory Visit: Payer: Self-pay | Admitting: Obstetrics & Gynecology

## 2021-12-10 DIAGNOSIS — O9089 Other complications of the puerperium, not elsewhere classified: Secondary | ICD-10-CM

## 2021-12-17 ENCOUNTER — Ambulatory Visit: Payer: Medicaid Other | Admitting: Clinical

## 2021-12-17 DIAGNOSIS — F332 Major depressive disorder, recurrent severe without psychotic features: Secondary | ICD-10-CM

## 2021-12-17 DIAGNOSIS — F411 Generalized anxiety disorder: Secondary | ICD-10-CM

## 2021-12-17 NOTE — Patient Instructions (Signed)
[facility] [address] [place], [place] [address] [phone] (main office) [phone] (Shailen Thielen's office)   

## 2021-12-18 ENCOUNTER — Ambulatory Visit: Payer: Self-pay | Admitting: Obstetrics and Gynecology

## 2022-01-01 ENCOUNTER — Encounter: Payer: Self-pay | Admitting: Obstetrics and Gynecology

## 2022-01-01 ENCOUNTER — Other Ambulatory Visit: Payer: Self-pay | Admitting: Lactation Services

## 2022-01-01 MED ORDER — METOCLOPRAMIDE HCL 10 MG PO TABS: 10.0000 mg | ORAL_TABLET | Freq: Three times a day (TID) | ORAL | 0 refills | Status: DC

## 2022-01-01 NOTE — Progress Notes (Signed)
Reglan ordered for Low milk supply per Dr. Alvester Morin. Infant is in NICU and mom exclusively pumping.

## 2022-01-23 ENCOUNTER — Ambulatory Visit: Payer: Medicaid Other | Admitting: Student

## 2022-01-27 ENCOUNTER — Telehealth: Payer: Self-pay | Admitting: Family Medicine

## 2022-01-27 NOTE — Telephone Encounter (Signed)
Called patient to inform of appointment change, there was no answer to the phone call so a voicemail was left with the call back number for the office.  

## 2022-01-30 ENCOUNTER — Ambulatory Visit: Payer: Medicaid Other | Admitting: Family Medicine

## 2022-02-03 ENCOUNTER — Ambulatory Visit: Payer: Medicaid Other

## 2022-02-12 ENCOUNTER — Inpatient Hospital Stay: Payer: Medicaid Other | Attending: Oncology

## 2022-02-12 DIAGNOSIS — D509 Iron deficiency anemia, unspecified: Secondary | ICD-10-CM | POA: Insufficient documentation

## 2022-02-12 DIAGNOSIS — Z9884 Bariatric surgery status: Secondary | ICD-10-CM | POA: Diagnosis not present

## 2022-02-12 DIAGNOSIS — Z79899 Other long term (current) drug therapy: Secondary | ICD-10-CM | POA: Insufficient documentation

## 2022-02-12 LAB — CBC WITH DIFFERENTIAL/PLATELET
Abs Immature Granulocytes: 0.03 10*3/uL (ref 0.00–0.07)
Basophils Absolute: 0.1 10*3/uL (ref 0.0–0.1)
Basophils Relative: 1 %
Eosinophils Absolute: 0.2 10*3/uL (ref 0.0–0.5)
Eosinophils Relative: 2 %
HCT: 35.9 % — ABNORMAL LOW (ref 36.0–46.0)
Hemoglobin: 11.9 g/dL — ABNORMAL LOW (ref 12.0–15.0)
Immature Granulocytes: 0 %
Lymphocytes Relative: 32 %
Lymphs Abs: 2.9 10*3/uL (ref 0.7–4.0)
MCH: 29.6 pg (ref 26.0–34.0)
MCHC: 33.1 g/dL (ref 30.0–36.0)
MCV: 89.3 fL (ref 80.0–100.0)
Monocytes Absolute: 0.5 10*3/uL (ref 0.1–1.0)
Monocytes Relative: 6 %
Neutro Abs: 5.4 10*3/uL (ref 1.7–7.7)
Neutrophils Relative %: 59 %
Platelets: 238 10*3/uL (ref 150–400)
RBC: 4.02 MIL/uL (ref 3.87–5.11)
RDW: 12.9 % (ref 11.5–15.5)
WBC: 9.1 10*3/uL (ref 4.0–10.5)
nRBC: 0 % (ref 0.0–0.2)

## 2022-02-12 LAB — IRON AND TIBC
Iron: 67 ug/dL (ref 28–170)
Saturation Ratios: 17 % (ref 10.4–31.8)
TIBC: 402 ug/dL (ref 250–450)
UIBC: 335 ug/dL

## 2022-02-12 LAB — FERRITIN: Ferritin: 51 ng/mL (ref 11–307)

## 2022-02-13 ENCOUNTER — Encounter: Payer: Self-pay | Admitting: *Deleted

## 2022-02-13 ENCOUNTER — Telehealth: Payer: Self-pay | Admitting: Oncology

## 2022-02-13 ENCOUNTER — Other Ambulatory Visit: Payer: Self-pay

## 2022-02-13 DIAGNOSIS — O99019 Anemia complicating pregnancy, unspecified trimester: Secondary | ICD-10-CM

## 2022-02-13 NOTE — Telephone Encounter (Signed)
pt called in wanting to know if appt can be virtual visit.Haley KitchenKJ

## 2022-02-13 NOTE — Telephone Encounter (Signed)
yes

## 2022-02-14 ENCOUNTER — Inpatient Hospital Stay (HOSPITAL_BASED_OUTPATIENT_CLINIC_OR_DEPARTMENT_OTHER): Payer: Medicaid Other | Admitting: Oncology

## 2022-02-14 ENCOUNTER — Encounter: Payer: Self-pay | Admitting: Oncology

## 2022-02-14 DIAGNOSIS — D509 Iron deficiency anemia, unspecified: Secondary | ICD-10-CM | POA: Diagnosis not present

## 2022-02-14 NOTE — Progress Notes (Signed)
I connected with Haley Kramer on 02/14/22 at  1:00 PM EDT by video enabled telemedicine visit and verified that I am speaking with the correct person using two identifiers.   I discussed the limitations, risks, security and privacy concerns of performing an evaluation and management service by telemedicine and the availability of in-person appointments. I also discussed with the patient that there may be a patient responsible charge related to this service. The patient expressed understanding and agreed to proceed.  Other persons participating in the visit and their role in the encounter:  none  Patient's location:  home Provider's location:  work  Stage manager Complaint: Routine follow-up of iron deficiency anemia  History of present illness: Patient is a 36 year old female who was seen for iron deficiency anemia during pregnancy and required IV iron.  She delivered in July 2023.  Interval history currently patient is doing well and denies any specific complaints at this time.   Review of Systems  Constitutional:  Negative for chills, fever, malaise/fatigue and weight loss.  HENT:  Negative for congestion, ear discharge and nosebleeds.   Eyes:  Negative for blurred vision.  Respiratory:  Negative for cough, hemoptysis, sputum production, shortness of breath and wheezing.   Cardiovascular:  Negative for chest pain, palpitations, orthopnea and claudication.  Gastrointestinal:  Negative for abdominal pain, blood in stool, constipation, diarrhea, heartburn, melena, nausea and vomiting.  Genitourinary:  Negative for dysuria, flank pain, frequency, hematuria and urgency.  Musculoskeletal:  Negative for back pain, joint pain and myalgias.  Skin:  Negative for rash.  Neurological:  Negative for dizziness, tingling, focal weakness, seizures, weakness and headaches.  Endo/Heme/Allergies:  Does not bruise/bleed easily.  Psychiatric/Behavioral:  Negative for depression and suicidal ideas. The patient  does not have insomnia.     No Known Allergies  Past Medical History:  Diagnosis Date   IDA (iron deficiency anemia)    Incomplete abortion 01/14/2021   Iron deficiency anemia, unspecified iron deficiency anemia type    Obesity    PONV (postoperative nausea and vomiting)    Vitamin D deficiency     Past Surgical History:  Procedure Laterality Date   CESAREAN SECTION  03/23/2011   DILATION AND EVACUATION N/A 01/22/2021   Procedure: DILATATION AND EVACUATION;  Surgeon: Natale Milch, MD;  Location: [facility];  Service: Gynecology;  Laterality: N/A;   ESOPHAGOGASTRODUODENOSCOPY  2016   GASTRIC BYPASS  2016   PERINEAL LACERATION REPAIR N/A 11/12/2021   Procedure: SUTURE REPAIR PERINEAL LACERATION;  Surgeon: Warden Fillers, MD;  Location: [facility];  Service: Obstetrics;  Laterality: N/A;    Social History   Socioeconomic History   Marital status: Single    Spouse name: aaron   Number of children: 1   Years of education: Not on file   Highest education level: Not on file  Occupational History   Not on file  Tobacco Use   Smoking status: Never   Smokeless tobacco: Never  Vaping Use   Vaping Use: Never used  Substance and Sexual Activity   Alcohol use: No   Drug use: Never   Sexual activity: Yes    Partners: Male    Birth control/protection: None    Comment: hx mirena x 10 yr/removed 05/11/2020  Other Topics Concern   Not on file  Social History Narrative   Lives with mother and child   Social Determinants of Health   Financial Resource Strain: Not on file  Food Insecurity: No Food Insecurity (09/16/2021)  Hunger Vital Sign    Worried About Running Out of Food in the Last Year: Never true    Ran Out of Food in the Last Year: Never true  Transportation Needs: No Transportation Needs (09/16/2021)   PRAPARE - Administrator, Civil Service (Medical): No    Lack of Transportation (Non-Medical): No  Physical Activity: Not on file  Stress: Not on file   Social Connections: Not on file  Intimate Partner Violence: Not At Risk (12/19/2020)   Humiliation, Afraid, Rape, and Kick questionnaire    Fear of Current or Ex-Partner: No    Emotionally Abused: No    Physically Abused: No    Sexually Abused: No    Family History  Problem Relation Age of Onset   Hypertension Mother    Diabetes Mother    Hypertension Father    Diabetes Father    Bipolar disorder Sister    Schizophrenia Sister    Narcolepsy Sister    Healthy Sister    Hypertension Maternal Grandmother    Diabetes Maternal Grandmother    Hypertension Maternal Grandfather    Diabetes Maternal Grandfather    Hypertension Paternal Grandmother    Hypertension Paternal Grandfather      Current Outpatient Medications:    acetaminophen (TYLENOL) 500 MG tablet, Take 2 tablets (1,000 mg total) by mouth every 6 (six) hours as needed for moderate pain, fever, headache or mild pain., Disp: 100 tablet, Rfl: 0   cyclobenzaprine (FLEXERIL) 10 MG tablet, Take 1 tablet (10 mg total) by mouth 3 (three) times daily as needed for muscle spasms., Disp: 30 tablet, Rfl: 2   doxylamine, Sleep, (UNISOM) 25 MG tablet, Take 50 mg by mouth at bedtime as needed., Disp: , Rfl:    ferrous gluconate (FERGON) 324 MG tablet, Take 1 tablet (324 mg total) by mouth every other day., Disp: 30 tablet, Rfl: 2   ibuprofen (ADVIL) 100 MG/5ML suspension, Take 30 mLs (600 mg total) by mouth every 6 (six) hours as needed for mild pain or moderate pain., Disp: 473 mL, Rfl: 2   metoCLOPramide (REGLAN) 10 MG tablet, Take 1 tablet (10 mg total) by mouth 3 (three) times daily before meals. Take 3 times a day for 30 days,Then take 2 a day for 4 days, Then take 1 a day for 4 days, Then stop, Disp: 102 tablet, Rfl: 0   oxyCODONE (OXY IR/ROXICODONE) 5 MG immediate release tablet, Take 1 tablet (5 mg total) by mouth every 6 (six) hours as needed for severe pain or breakthrough pain., Disp: 30 tablet, Rfl: 0   Prenatal  Multivit-Min-Fe-FA (PRE-NATAL PO), Take by mouth., Disp: , Rfl:    senna-docusate (SENOKOT-S) 8.6-50 MG tablet, Take 2 tablets by mouth at bedtime as needed for mild constipation or moderate constipation., Disp: 30 tablet, Rfl: 2   sertraline (ZOLOFT) 50 MG tablet, TAKE 1 TABLET BY MOUTH EVERY DAY, Disp: 90 tablet, Rfl: 1   zolpidem (AMBIEN) 5 MG tablet, Take 1 tablet (5 mg total) by mouth at bedtime as needed for sleep., Disp: 30 tablet, Rfl: 1  No results found.  No images are attached to the encounter.      Latest Ref Rng & Units 11/11/2021   12:50 AM  CMP  Glucose 70 - 99 mg/dL 77   BUN 6 - 20 mg/dL 6   Creatinine 4.16 - 3.84 mg/dL 5.36   Sodium 468 - 032 mmol/L 137   Potassium 3.5 - 5.1 mmol/L 3.6   Chloride 98 -  111 mmol/L 110   CO2 22 - 32 mmol/L 19   Calcium 8.9 - 10.3 mg/dL 8.5   Total Protein 6.5 - 8.1 g/dL 5.8   Total Bilirubin 0.3 - 1.2 mg/dL 1.0   Alkaline Phos 38 - 126 U/L 127   AST 15 - 41 U/L 25   ALT 0 - 44 U/L 14       Latest Ref Rng & Units 02/12/2022    1:49 PM  CBC  WBC 4.0 - 10.5 K/uL 9.1   Hemoglobin 12.0 - 15.0 g/dL 11.9   Hematocrit 36.0 - 46.0 % 35.9   Platelets 150 - 400 K/uL 238      Observation/objective: Appears in no acute distress over video visit today.  Breathing is nonlabored  Assessment and plan: Patient is a 61 year oldFemale in for routine follow-up visit for iron deficiency anemia  Patient required IV iron during pregnancy.  She dropped her hemoglobin to 8.9 following delivery but is presently up to 11.9.  Ferritin is normal at 51 with normal iron studies.  She does not require any IV iron at this time.  Patient can continue to follow-up with her primary care doctor and can be referred to [place] in the future if questions or concerns arise  Follow-up instructions: No follow-up needed  I discussed the assessment and treatment plan with the patient. The patient was provided an opportunity to ask questions and all were answered. The patient  agreed with the plan and demonstrated an understanding of the instructions.   The patient was advised to call back or seek an in-person evaluation if the symptoms worsen or if the condition fails to improve as anticipated.  I provided 12 minutes of face-to-face video visit time during this encounter, and > 50% was spent counseling as documented under my assessment & plan.  Visit Diagnosis: 1. Iron deficiency anemia, unspecified iron deficiency anemia type     Dr. Randa Evens, MD, MPH [facility] Tel- [phone] 02/14/2022 6:42 PM

## 2022-03-11 ENCOUNTER — Encounter: Payer: Self-pay | Admitting: Obstetrics and Gynecology

## 2022-06-19 IMAGING — US US MFM OB FOLLOW-UP
1 series · 14 of 28 positions shown · non-contrast
Comparison: none

[Series 1: us mfm ob follow-up · 48 acquisitions, 14 frames shown]
[im 2/48]
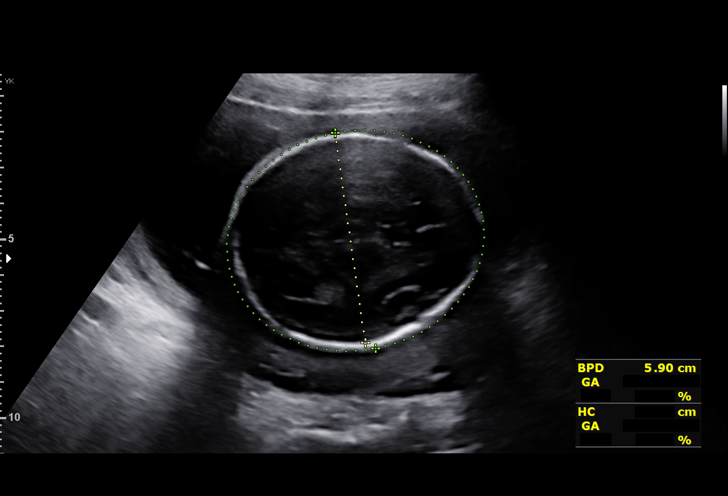
[im 6/48]
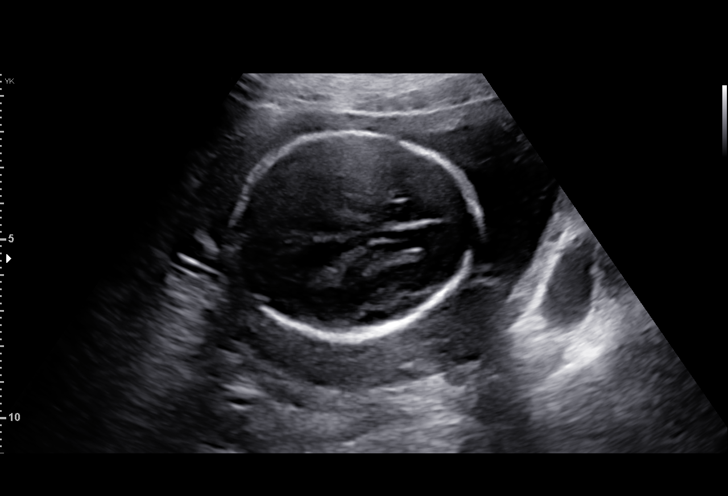
[im 9/48]
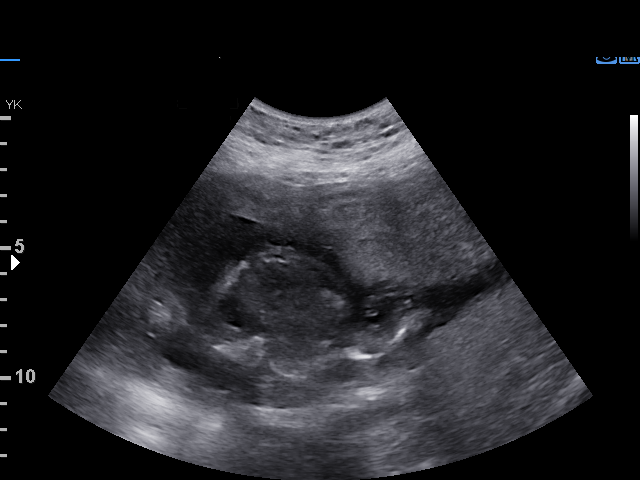
[im 13/48]
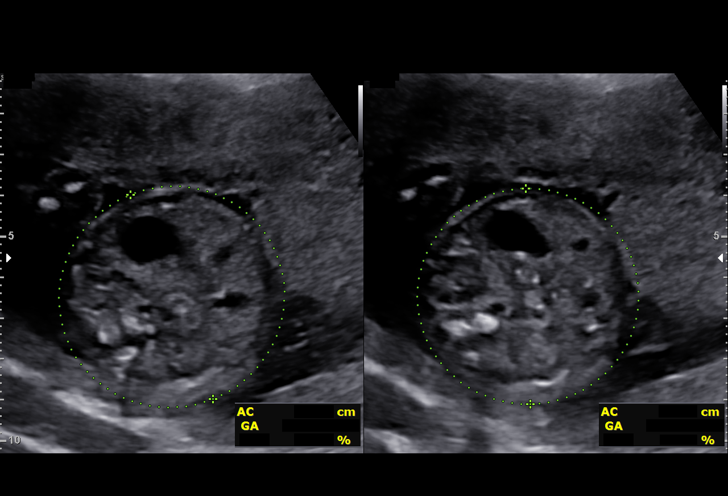
[im 16/48]
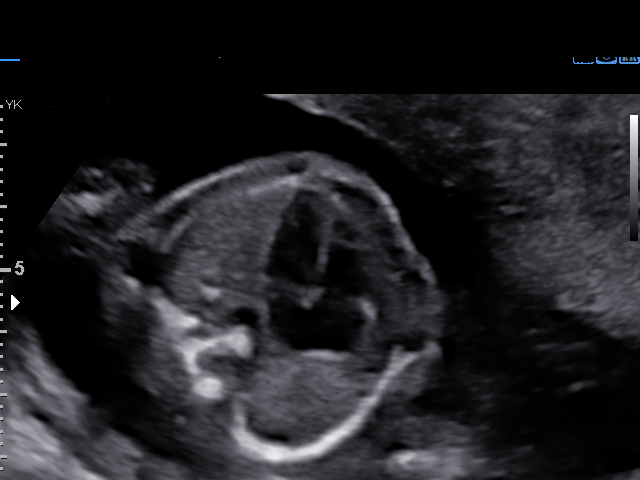
[im 20/48]
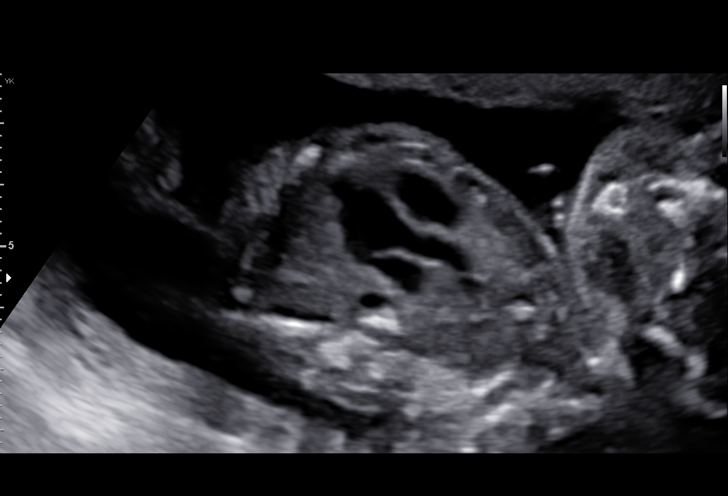
[im 23/48]
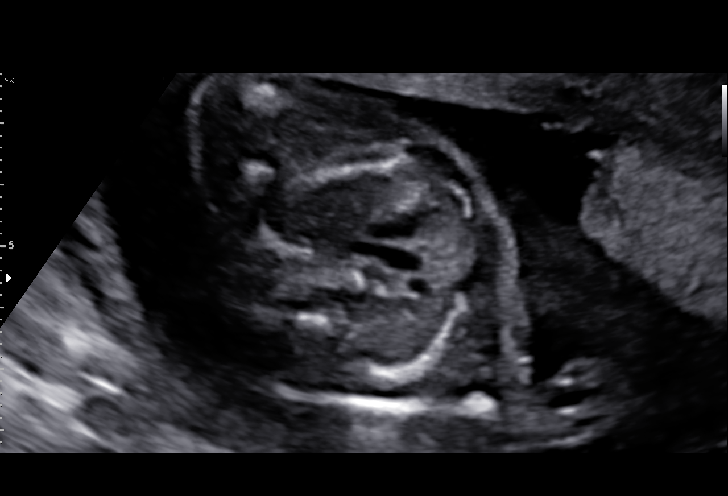
[im 27/48]
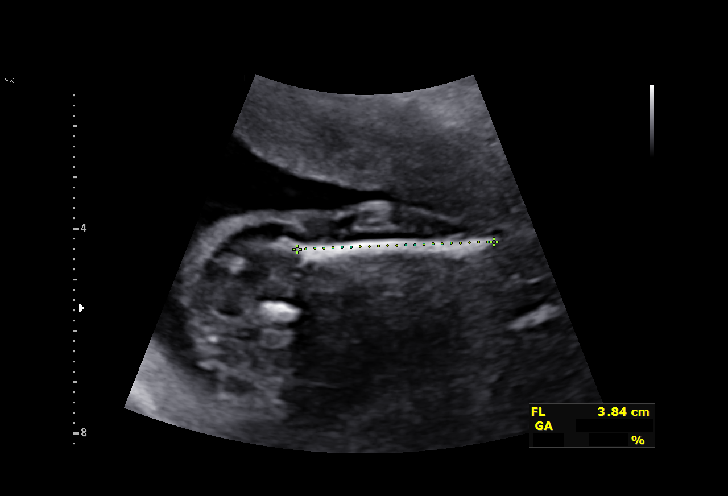
[im 30/48]
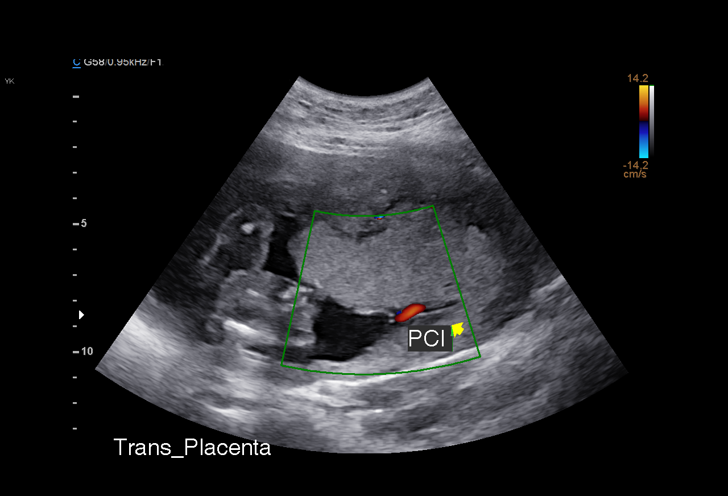
[im 34/48]
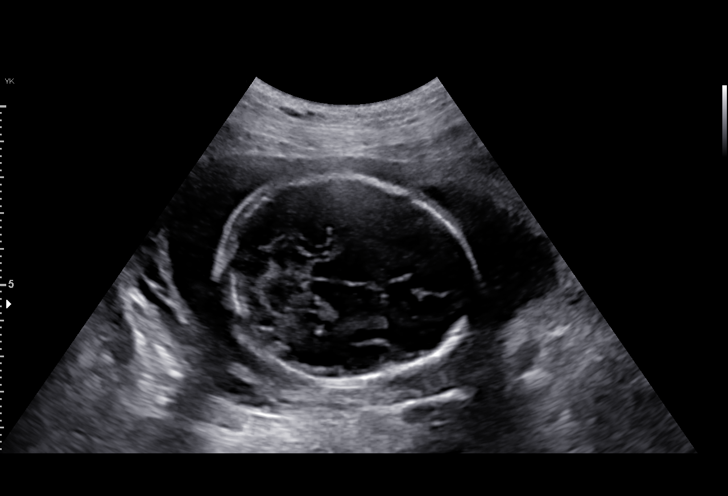
[im 37/48]
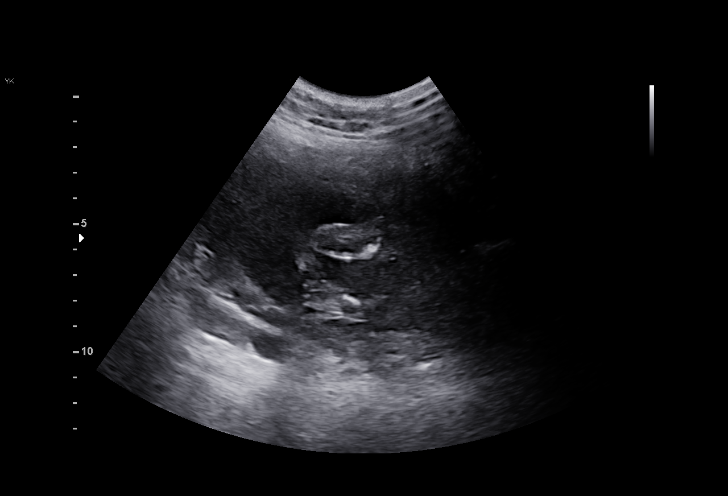
[im 41/48]
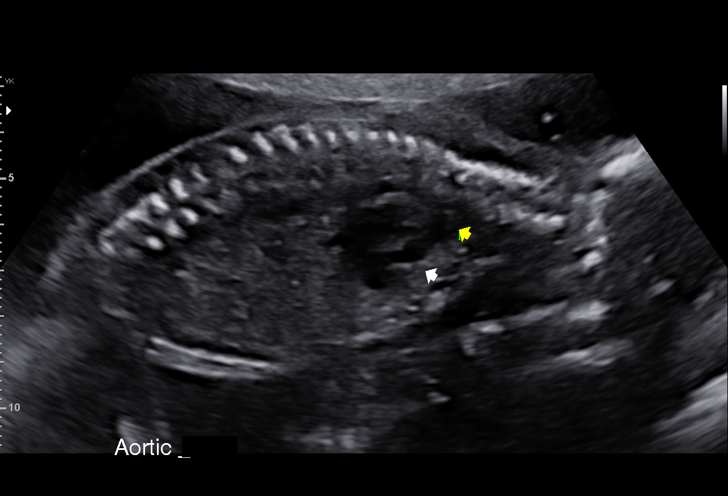
[im 44/48]
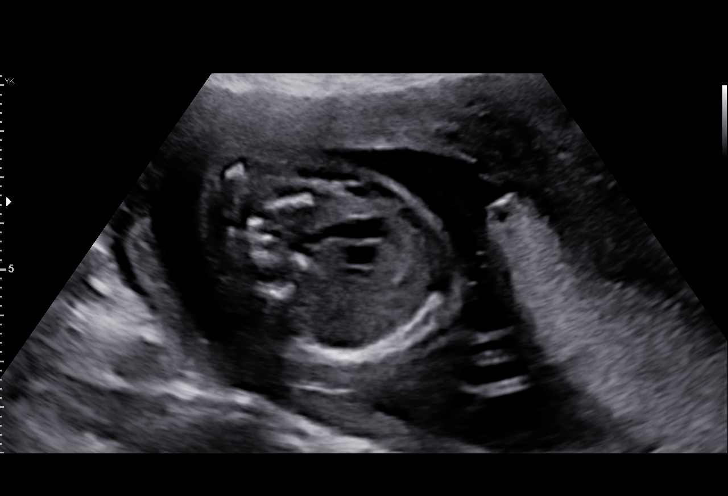
[im 48/48]
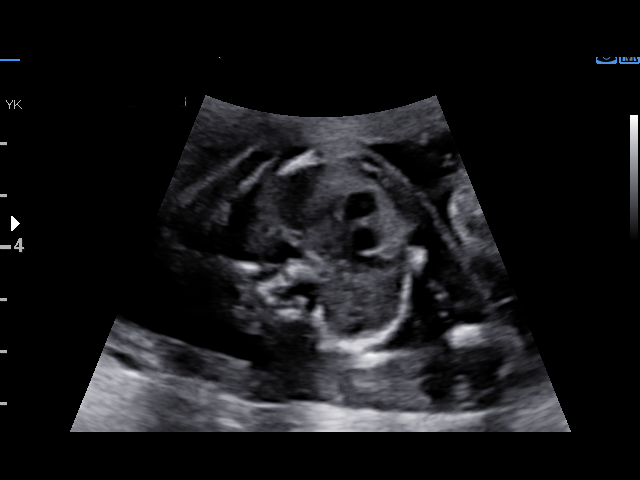

[14 of 28 positions shown; findings below may reference images not displayed]

ZEMARY

Indications

 Advanced maternal age multigravida 35+,
 second trimester
 Obesity complicating pregnancy, second
 trimester (BMI:32)
 Encounter for antenatal screening for
 malformations
 History of fetal abnormality in previous
 pregnancy, currently pregnant (trisomi35)
 Genetic carrier (SMA), FOB: not a carrier
 COtF8: Neg
 Rh negative state in antepartum
 22 weeks gestation of pregnancy
Fetal Evaluation

 Num Of Fetuses:         1
 Fetal Heart Rate(bpm):  148
 Cardiac Activity:       Observed
 Presentation:           Cephalic
 Placenta:               Left lateral
 P. Cord Insertion:      Visualized, central

 Amniotic Fluid
 AFI FV:      Within normal limits

                             Largest Pocket(cm)

Biometry
 BPD:      59.2  mm     G. Age:  24w 1d         95  %    CI:        78.46   %    70 - 86
                                                         FL/HC:      17.9   %    18.4 -
 HC:      211.4  mm     G. Age:  23w 1d         69  %    HC/AC:      1.25        1.06 -
 AC:      168.7  mm     G. Age:  21w 6d         25  %    FL/BPD:     64.0   %    71 - 87
 FL:       37.9  mm     G. Age:  22w 1d         29  %    FL/AC:      22.5   %    20 - 24
 CER:      23.3  mm     G. Age:  21w 4d         47  %

 CM:        5.8  mm

 Est. FW:     483  gm      1 lb 1 oz     31  %
OB History

 Gravidity:    3         Term:   1         SAB:   1
 Living:       1
Gestational Age

 LMP:           22w 3d        Date:  02/25/21                 EDD:   12/02/21
 U/S Today:     22w 6d                                        EDD:   11/29/21
 Best:          22w 3d     Det. By:  LMP  (02/25/21)          EDD:   12/02/21
Anatomy

 Cranium:               Appears normal         Aortic Arch:            Appears normal
 Cavum:                 Appears normal         Ductal Arch:            Appears normal
 Ventricles:            Appears normal         Diaphragm:              Appears normal
 Choroid Plexus:        Appears normal         Stomach:                Appears normal, left
                                                                       sided
 Cerebellum:            Appears normal         Abdomen:                Appears normal
 Posterior Fossa:       Appears normal         Abdominal Wall:         Appears nml (cord
                                                                       insert, abd wall)
 Nuchal Fold:           Previously seen        Cord Vessels:           Appears normal (3
                                                                       vessel cord)
 Face:                  Orbits and profile     Kidneys:                Appear normal
                        previously seen
 Lips:                  Appears normal         Bladder:                Appears normal
 Thoracic:              Appears normal         Spine:                  Appears normal
 Heart:                 Appears normal         Upper Extremities:      Appears normal
                        (4CH, axis, and
                        situs)
 RVOT:                  Appears normal         Lower Extremities:      Appears normal
 LVOT:                  Appears normal

 Other:  Fetus appears to be a male. Nasal bone visualized. Hands and feet
         visualized.
Cervix Uterus Adnexa

 Cervix
 Length:           3.36  cm.
 Normal appearance by transabdominal scan.

 Uterus
 No abnormality visualized.
 Cul De Sac
 No free fluid seen.
Impression

 Fetal growth is appropriate for gestational age .Amniotic fluid
 is normal and good fetal activity is seen .  Cardiac anatomy
 appears normal.

 This study was remotely read .
Recommendations

 -An appointment was made for her to return in 8 weeks for
 fetal growth assessment.
                 Antoinette, Ligaya

## 2022-07-31 ENCOUNTER — Other Ambulatory Visit: Payer: Self-pay | Admitting: Obstetrics & Gynecology

## 2022-08-28 IMAGING — US US MFM FETAL BPP W/O NON-STRESS
1 series · 14 of 28 positions shown · non-contrast
Comparison: none

[Series 1: us mfm fetal bpp w/o non-stress · 41 acquisitions, 14 frames shown]
[im 2/41]
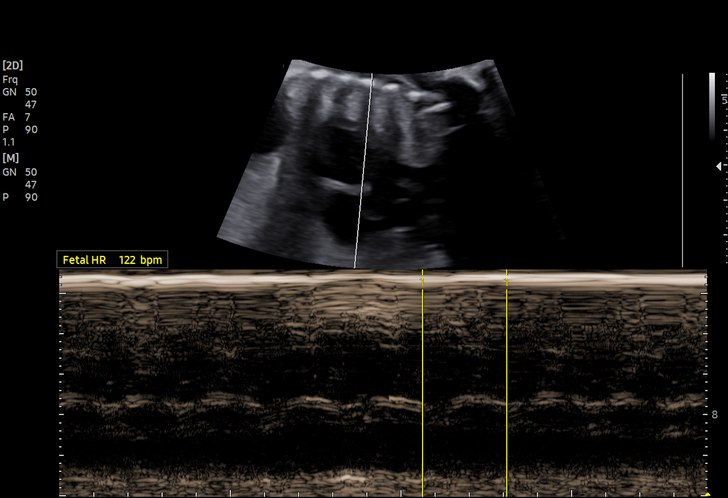
[im 5/41]
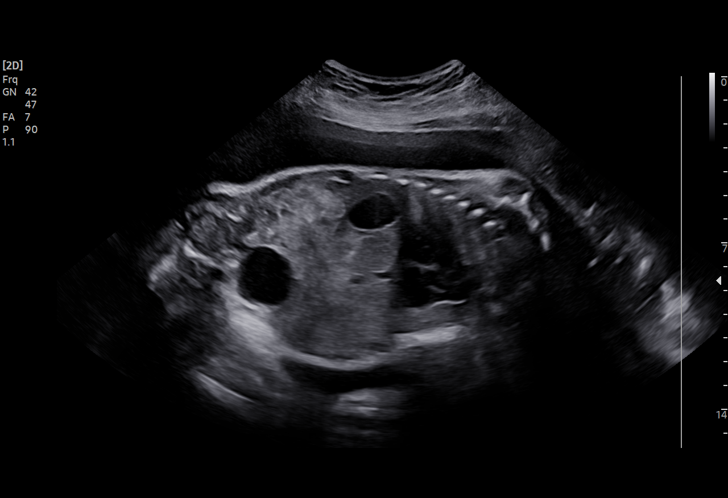
[im 8/41]
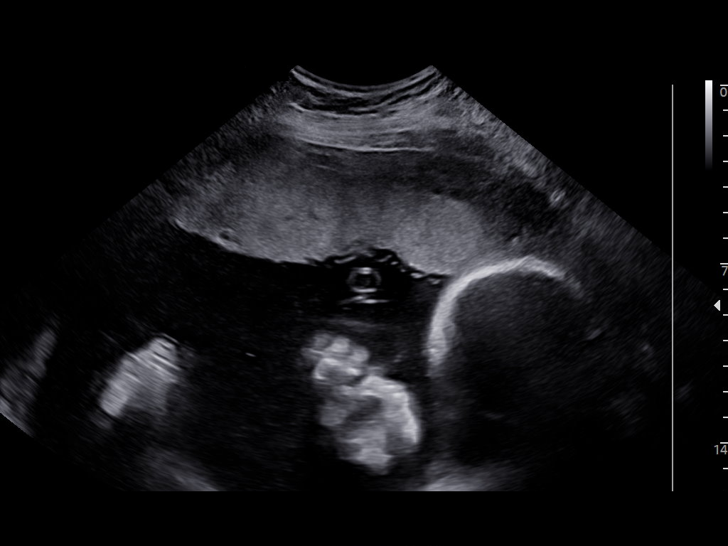
[im 11/41]
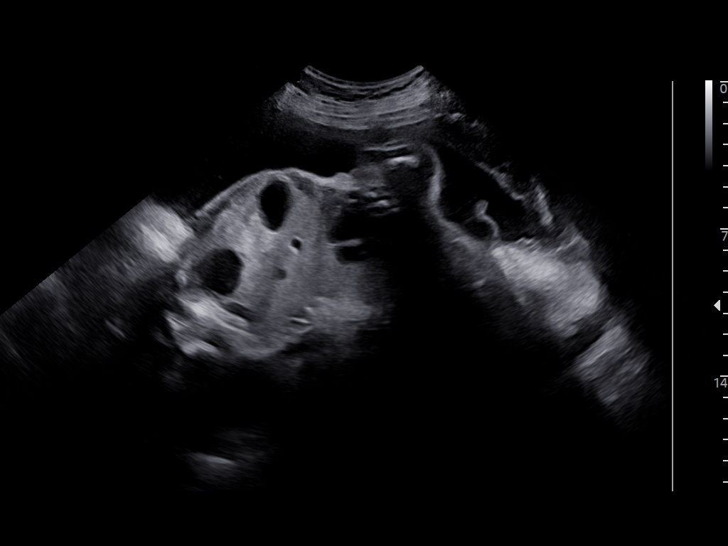
[im 14/41]
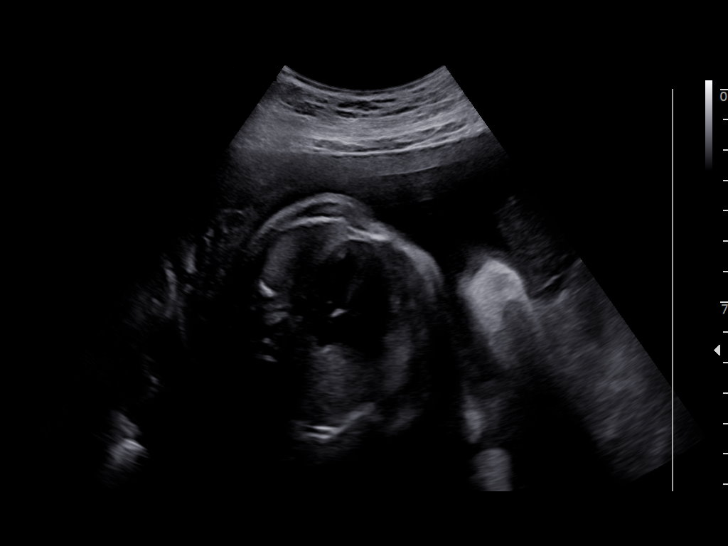
[im 17/41]
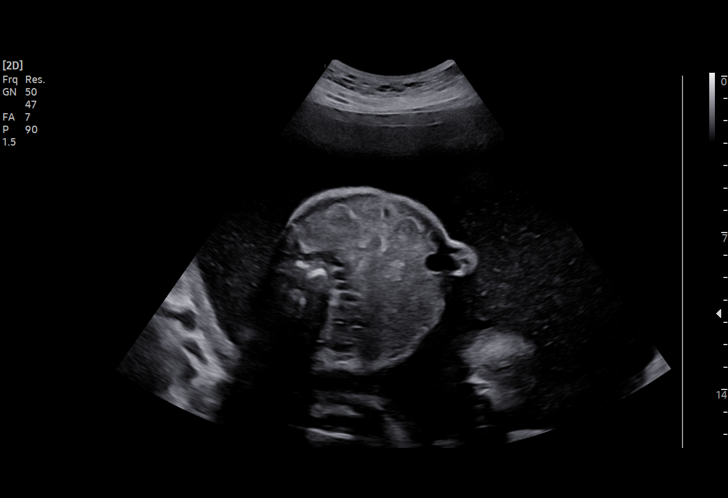
[im 20/41]
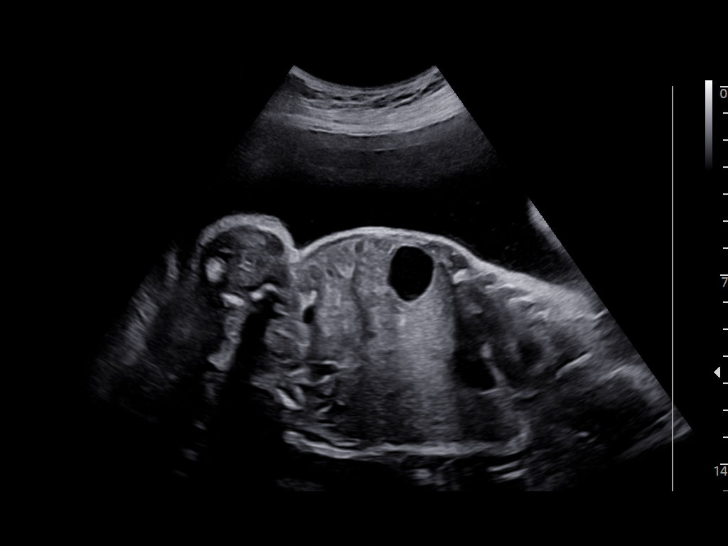
[im 23/41]
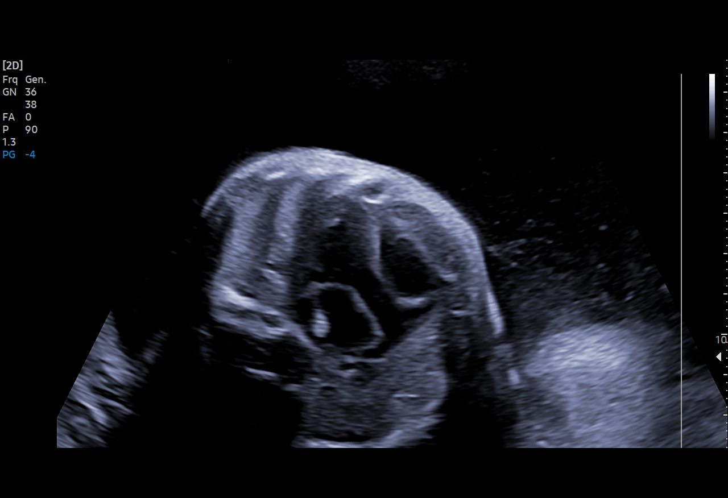
[im 26/41]
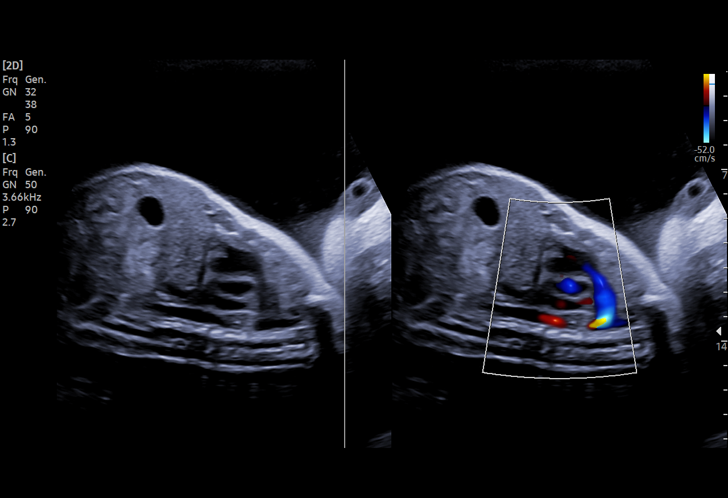
[im 29/41]
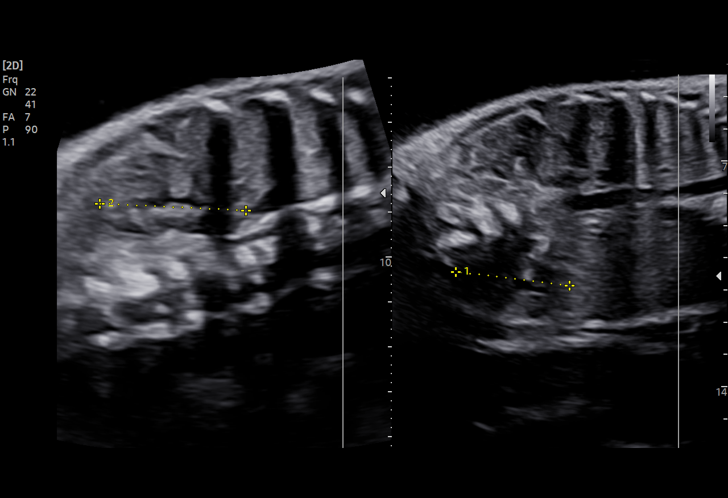
[im 32/41]
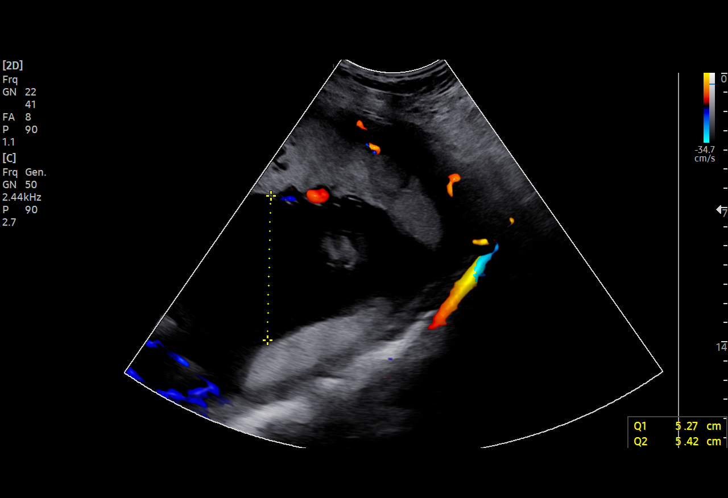
[im 35/41]
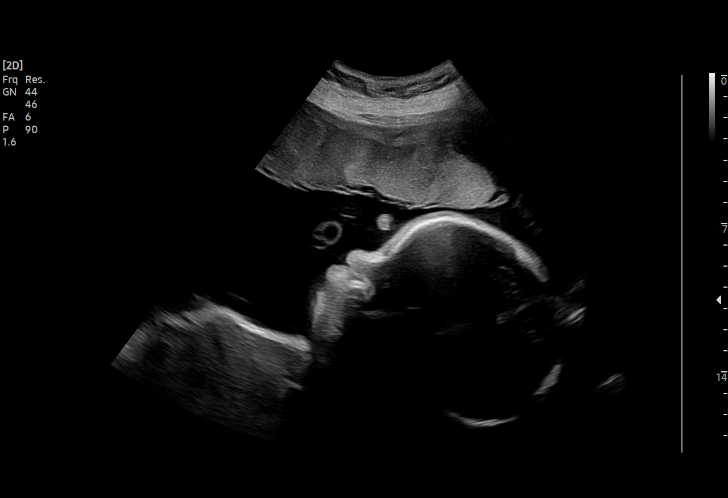
[im 38/41]
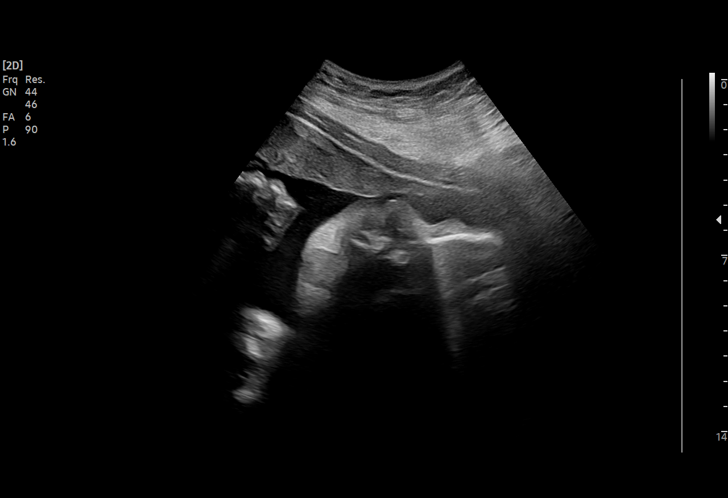
[im 41/41]
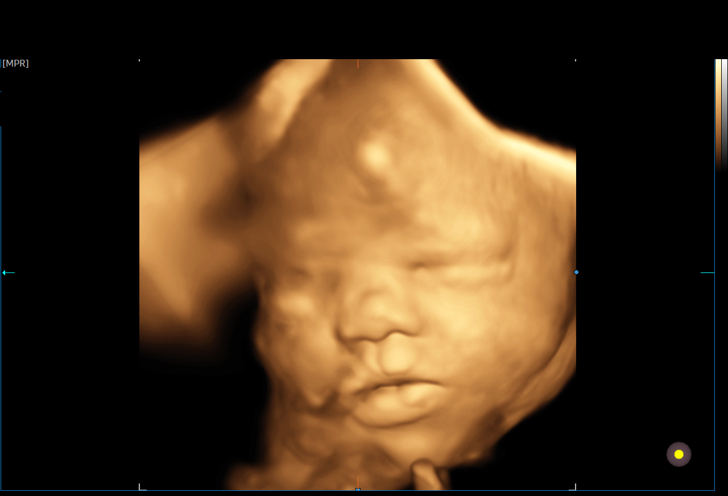

[14 of 28 positions shown; findings below may reference images not displayed]

Indications

 Polyhydramnios, third trimester, antepartum
 condition or complication, unspecified fetus
 Pregnancy complicated by previous gastric
 bypass, antepartum, third trimester
 Advanced maternal age multigravida 35+,
 third trimester
 32 weeks gestation of pregnancy
 Obesity complicating pregnancy, third
 trimester (BMI 32)
 History of fetal abnormality in previous
 pregnancy, currently pregnant (trisomy 16)
 Genetic carrier (SMA), FOB: not a carrier
 7ItW3: Neg
 Rh negative state in antepartum
 Abnormal fetal ultrasound (shortened long
 bones)
 Previous cesarean delivery, antepartum
Fetal Evaluation

 Num Of Fetuses:         1
 Fetal Heart Rate(bpm):  122
 Cardiac Activity:       Observed
 Presentation:           Cephalic
 Placenta:               Anterior
 P. Cord Insertion:      Previously Visualized
 Amniotic Fluid
 AFI FV:      Polyhydramnios

 AFI Sum(cm)     %Tile       Largest Pocket(cm)
 27.94           > 97

 RUQ(cm)       RLQ(cm)       LUQ(cm)        LLQ(cm)

Biophysical Evaluation

 Amniotic F.V:   Within normal limits       F. Tone:        Observed
 F. Movement:    Observed                   Score:          [DATE]
 F. Breathing:   Observed
OB History

 Gravidity:    3         Term:   1         SAB:   1
 Living:       1
Gestational Age

 LMP:           32w 3d        Date:  02/25/21                 EDD:   12/02/21
 Best:          32w 3d     Det. By:  LMP  (02/25/21)          EDD:   12/02/21
Comments

 This patient was seen for a BPP due to polyhydramnios.  She
 had an amniocentesis last week which showed normal 46 XY
 chromosomes.  The 4Y4MX testing was negative indicating
 that her fetus is unlikely to have achondroplasia.  The
 MicroArray testing is still pending.  She denies any problems
 since her last exam.
 A biophysical profile performed today was [DATE].
 Polyhydramnios with a total AFI of 27.9 cm continues to be
 noted.
 She will return in 1 week for another BPP and growth scan.

## 2022-09-02 IMAGING — US US MFM FETAL BPP W/ NON-STRESS
1 series · 13 of 28 positions shown · non-contrast
Comparison: none

[Series 1: us mfm fetal bpp w/ non-stress · 31 acquisitions, 13 frames shown]
[im 2/31]
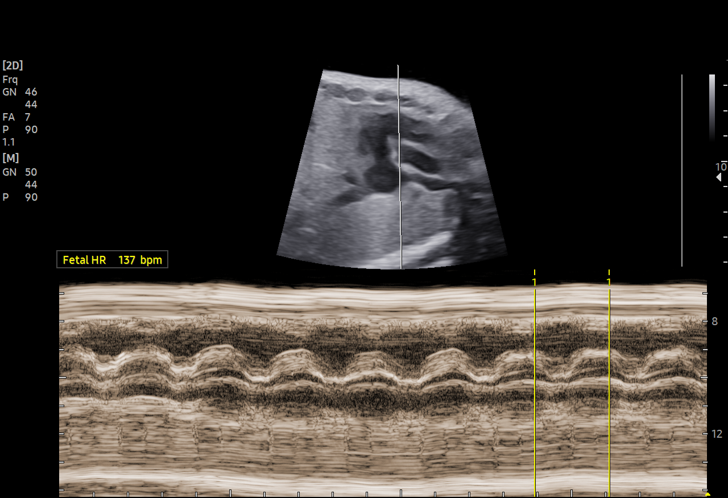
[im 4/31]
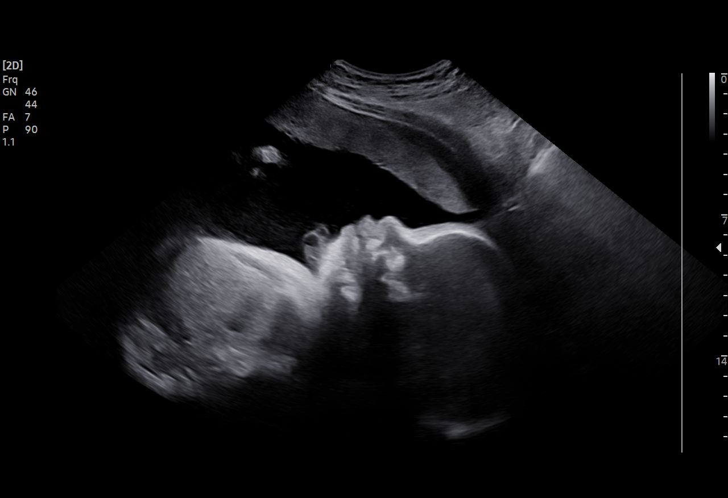
[im 6/31]
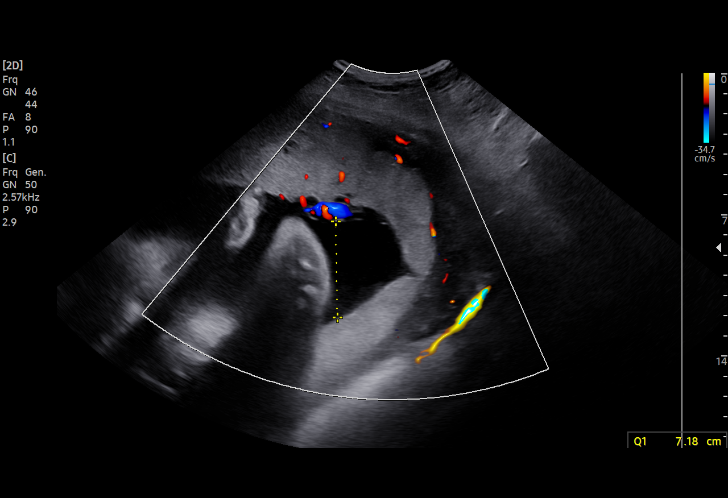
[im 8/31]
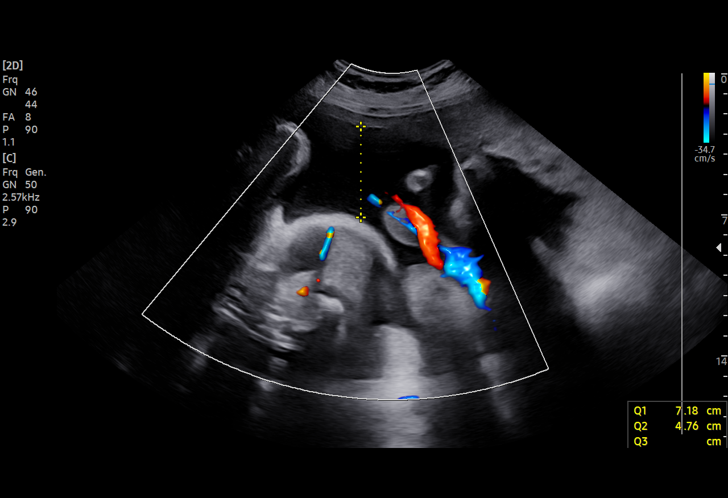
[im 11/31]
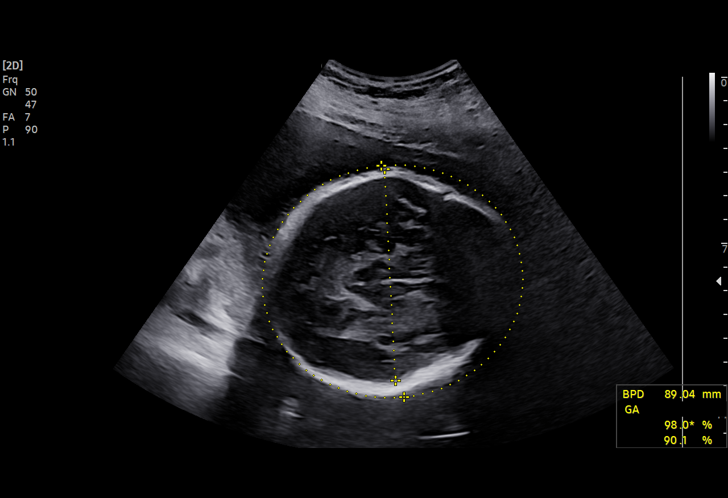
[im 13/31]
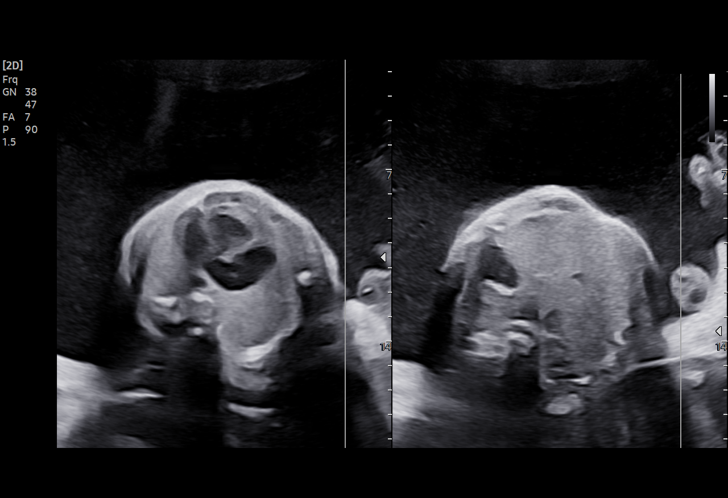
[im 16/31]
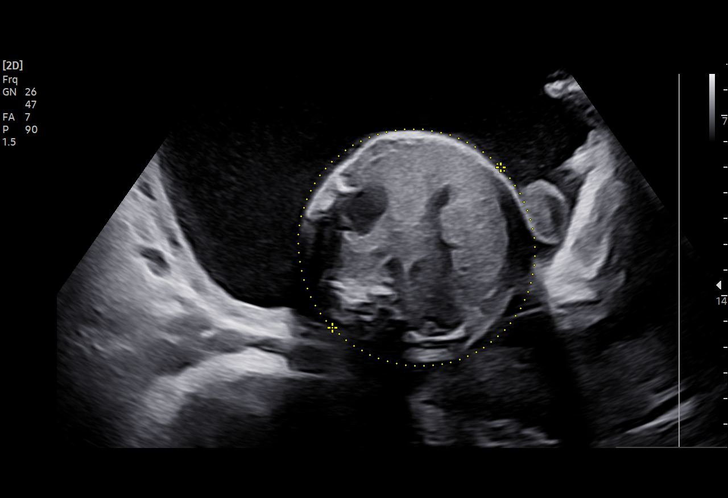
[im 18/31]
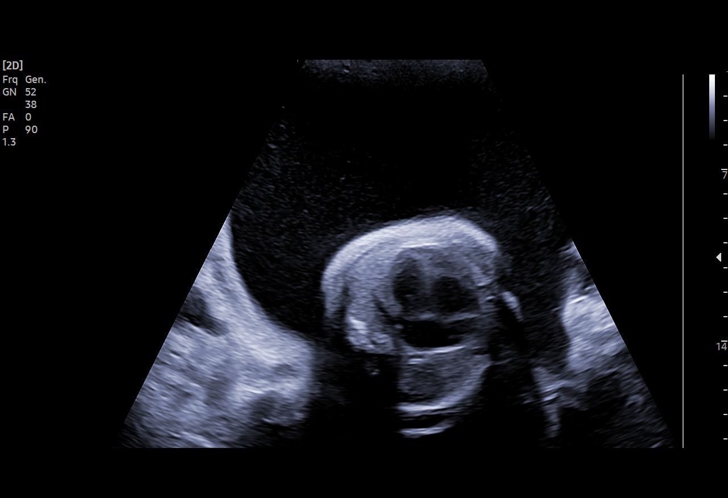
[im 21/31]
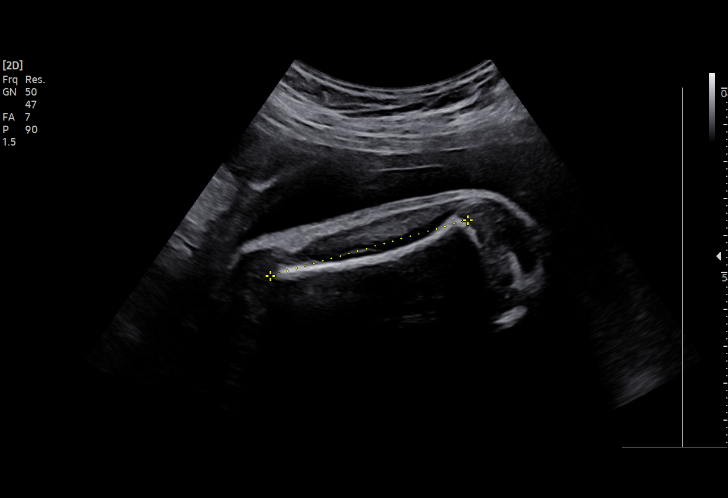
[im 23/31]
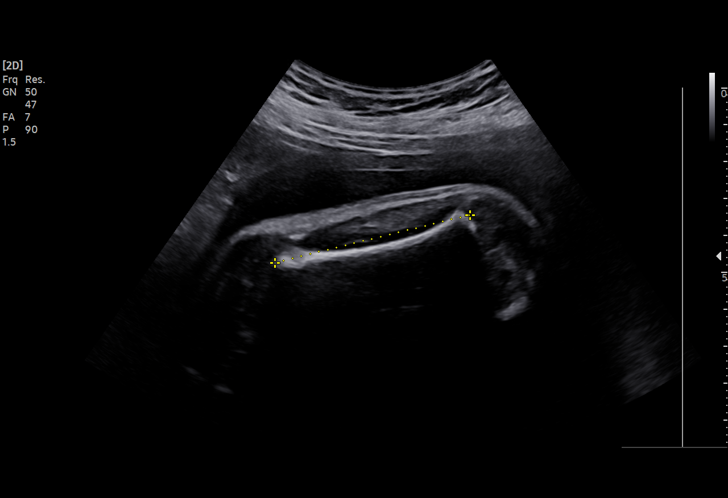
[im 25/31]
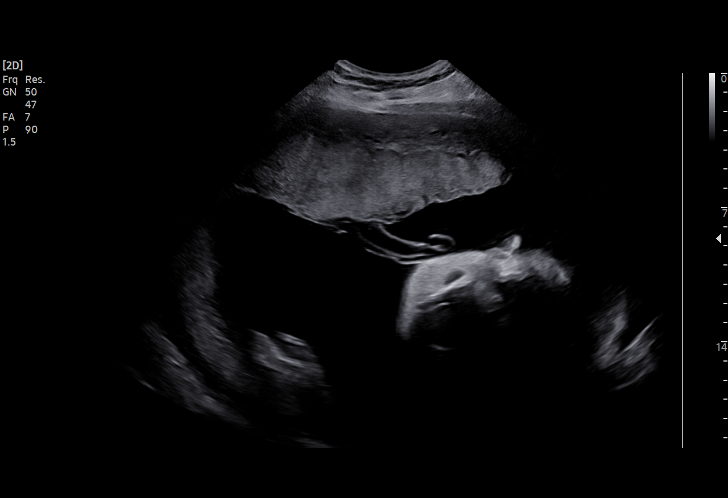
[im 27/31]
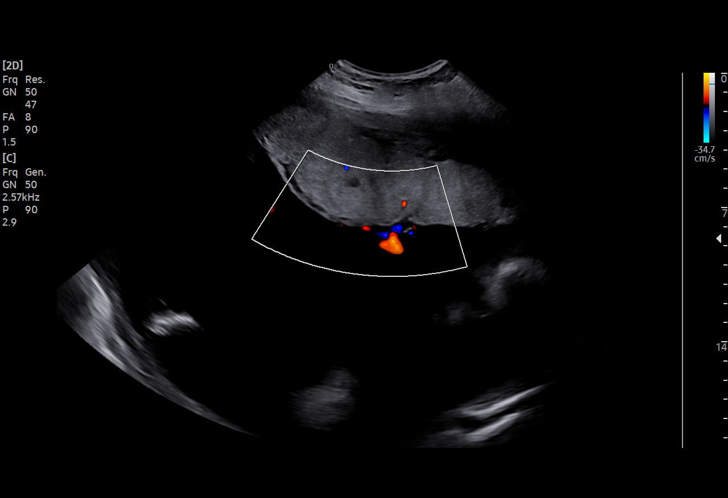
[im 29/31]
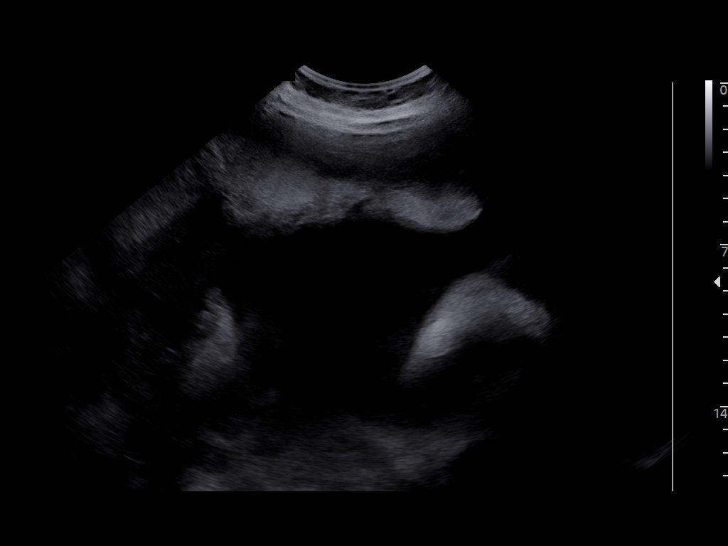

[13 of 28 positions shown; findings below may reference images not displayed]

W/NONSTRESS

Indications

 Polyhydramnios, third trimester, antepartum
 condition or complication, unspecified fetus
 Pregnancy complicated by previous gastric
 bypass, antepartum, third trimester
 Advanced maternal age multigravida 35+,
 third trimester
 Obesity complicating pregnancy, third
 trimester (BMI 32)
 History of fetal abnormality in previous
 pregnancy, currently pregnant (trisomy 16)
 Genetic carrier (SMA), FOB: not a carrier
 Rh negative state in antepartum
 Abnormal fetal ultrasound (shortened long
 bones)
 Previous cesarean delivery, antepartum
 Encounter for other antenatal screening
 follow-up
 33 weeks gestation of pregnancy
 L3tYY: Neg
Fetal Evaluation

 Num Of Fetuses:         1
 Fetal Heart Rate(bpm):  137
 Cardiac Activity:       Observed
 Presentation:           Cephalic
 Placenta:               Anterior
 P. Cord Insertion:      Previously Visualized

 Amniotic Fluid
 AFI FV:      Polyhydramnios

 AFI Sum(cm)     %Tile       Largest Pocket(cm)
 27.92           > 97

 RUQ(cm)       RLQ(cm)       LUQ(cm)        LLQ(cm)

Biophysical Evaluation

 Amniotic F.V:   Polyhydramnios             F. Tone:        Observed
 F. Movement:    Observed                   N.S.T:          Reactive
 F. Breathing:   Not Observed               Score:          [DATE]
Biometry

 BPD:     90.37  mm     G. Age:  36w 4d       > 99  %    CI:        76.81   %    70 - 86
                                                         FL/HC:      16.9   %    19.9 -
 HC:      326.6  mm     G. Age:  37w 0d         96  %    HC/AC:      1.14        0.96 -
 AC:    287.42   mm     G. Age:  32w 5d         40  %    FL/BPD:     61.1   %    71 - 87
 FL:      55.26  mm     G. Age:  29w 1d        < 1  %    FL/AC:      19.2   %    20 - 24

 Est. FW:    9646  gm      4 lb 5 oz     21  %
OB History

 Gravidity:    3         Term:   1         SAB:   1
 Living:       1
Gestational Age

 LMP:           33w 1d        Date:  02/25/21                 EDD:   12/02/21
 U/S Today:     33w 6d                                        EDD:   11/27/21
 Best:          33w 1d     Det. By:  LMP  (02/25/21)          EDD:   12/02/21
Anatomy

 Cranium:               Brachycephaly          Aortic Arch:            Previously seen
 Cavum:                 Previously seen        Ductal Arch:            Previously seen
 Ventricles:            Previously seen        Diaphragm:              Previously seen
 Choroid Plexus:        Previously seen        Stomach:                Appears normal, left
                                                                       sided
 Cerebellum:            Previously seen        Abdomen:                Appears normal
 Posterior Fossa:       Previously seen        Abdominal Wall:         Previously seen
 Nuchal Fold:           Not applicable (>20    Cord Vessels:           Previously seen
                        wks GA)
 Face:                  Orbits and profile     Kidneys:                Previously seen
                        previously seen
 Lips:                  Previously seen        Bladder:                Previously seen
 Thoracic:              Appears normal         Spine:                  Previously seen
 Heart:                 Appears normal         Upper Extremities:      Abnormal, see
                        (4CH, axis, and
                        situs)
                                                                       comments

 RVOT:                  Previously seen        Lower Extremities:      Previously seen
 LVOT:                  Previously seen
 Other:  Fetus appears to be a male. Nasal bone, hands and feet previously
         visualized.
Impression

 Follow up growth due to suspected skeletal dysplasia (
 Genetic testing incluiding microarray returned normal).
 Normal interval growth with measurements consistent with
 dates
 Good fetal movement and mild polyhydramnios.
 Biophysical profile [DATE]

 Previous FLAC was > 0.16 which suggest low risk for skeletal
 dysplasia.
Recommendations

 Continue weekly testing as previously recommended.
 Repeat growth in 4 weeks.

## 2022-11-04 ENCOUNTER — Ambulatory Visit (LOCAL_COMMUNITY_HEALTH_CENTER): Payer: Self-pay

## 2022-11-04 DIAGNOSIS — Z111 Encounter for screening for respiratory tuberculosis: Secondary | ICD-10-CM

## 2022-11-07 ENCOUNTER — Ambulatory Visit (LOCAL_COMMUNITY_HEALTH_CENTER): Payer: Self-pay

## 2022-11-07 DIAGNOSIS — Z111 Encounter for screening for respiratory tuberculosis: Secondary | ICD-10-CM

## 2022-11-07 LAB — TB SKIN TEST
Induration: 0 mm
TB Skin Test: NEGATIVE

## 2022-11-10 HISTORY — PX: CHOLECYSTECTOMY: SHX55

## 2022-11-27 ENCOUNTER — Encounter: Payer: Self-pay | Admitting: Oncology

## 2022-12-03 ENCOUNTER — Other Ambulatory Visit: Payer: Self-pay | Admitting: *Deleted

## 2022-12-03 DIAGNOSIS — D509 Iron deficiency anemia, unspecified: Secondary | ICD-10-CM

## 2022-12-05 ENCOUNTER — Other Ambulatory Visit: Payer: Medicaid Other

## 2022-12-05 ENCOUNTER — Ambulatory Visit: Payer: Medicaid Other | Admitting: Oncology

## 2022-12-08 ENCOUNTER — Inpatient Hospital Stay: Payer: Medicaid Other | Attending: Oncology

## 2022-12-08 ENCOUNTER — Encounter: Payer: Self-pay | Admitting: Oncology

## 2022-12-08 ENCOUNTER — Inpatient Hospital Stay (HOSPITAL_BASED_OUTPATIENT_CLINIC_OR_DEPARTMENT_OTHER): Payer: Medicaid Other | Admitting: Oncology

## 2022-12-08 VITALS — BP 126/83 | HR 68 | Temp 95.9°F | Resp 18 | Ht 67.0 in | Wt 200.9 lb

## 2022-12-08 DIAGNOSIS — D509 Iron deficiency anemia, unspecified: Secondary | ICD-10-CM

## 2022-12-08 LAB — IRON AND TIBC
Iron: 81 ug/dL (ref 28–170)
Saturation Ratios: 18 % (ref 10.4–31.8)
TIBC: 455 ug/dL — ABNORMAL HIGH (ref 250–450)
UIBC: 374 ug/dL

## 2022-12-08 LAB — CBC WITH DIFFERENTIAL (CANCER CENTER ONLY)
Abs Immature Granulocytes: 0.01 10*3/uL (ref 0.00–0.07)
Basophils Absolute: 0 10*3/uL (ref 0.0–0.1)
Basophils Relative: 1 %
Eosinophils Absolute: 0.1 10*3/uL (ref 0.0–0.5)
Eosinophils Relative: 1 %
HCT: 39.6 % (ref 36.0–46.0)
Hemoglobin: 12.6 g/dL (ref 12.0–15.0)
Immature Granulocytes: 0 %
Lymphocytes Relative: 38 %
Lymphs Abs: 2.7 10*3/uL (ref 0.7–4.0)
MCH: 28.8 pg (ref 26.0–34.0)
MCHC: 31.8 g/dL (ref 30.0–36.0)
MCV: 90.6 fL (ref 80.0–100.0)
Monocytes Absolute: 0.4 10*3/uL (ref 0.1–1.0)
Monocytes Relative: 6 %
Neutro Abs: 3.9 10*3/uL (ref 1.7–7.7)
Neutrophils Relative %: 54 %
Platelet Count: 268 10*3/uL (ref 150–400)
RBC: 4.37 MIL/uL (ref 3.87–5.11)
RDW: 12.2 % (ref 11.5–15.5)
WBC Count: 7.2 10*3/uL (ref 4.0–10.5)
nRBC: 0 % (ref 0.0–0.2)

## 2022-12-08 LAB — VITAMIN B12: Vitamin B-12: 275 pg/mL (ref 180–914)

## 2022-12-08 LAB — FERRITIN: Ferritin: 26 ng/mL (ref 11–307)

## 2022-12-08 NOTE — Telephone Encounter (Signed)
Patient was seen on 12/08/22

## 2022-12-08 NOTE — Progress Notes (Signed)
Hematology/Oncology Consult note [facility]  [phone] [phone]  Patient Care Team: Patient, No Pcp Per as PCP - General (General Practice) Hermina Staggers, MD as PCP - OBGYN (Obstetrics and Gynecology) Creig Hines, MD as Consulting Physician (Hematology and Oncology) Natale Milch, MD as Consulting Physician (Obstetrics and Gynecology)   Name of the patient: Haley Kramer  [id number]  1986-02-25   Date of visit: 12/08/22  Diagnosis-iron deficiency anemia  Chief complaint/ Reason for visit-routine follow-up of iron deficiency anemia  Heme/Onc history: Patient is a 37 year old female who was seen for iron deficiency anemia during pregnancy and required IV iron.  She delivered in July 2023.   Interval history-presently patient reports some ongoing fatigue.  She is unable to tolerate oral iron.  Her menstrual cycles are heavy and she is trying to get a Mirena placed soon  ECOG PS- 0 Pain scale- 0   Review of systems- Review of Systems  Constitutional:  Positive for malaise/fatigue.      No Known Allergies   Past Medical History:  Diagnosis Date   IDA (iron deficiency anemia)    Incomplete abortion 01/14/2021   Iron deficiency anemia, unspecified iron deficiency anemia type    Obesity    PONV (postoperative nausea and vomiting)    Vitamin D deficiency      Past Surgical History:  Procedure Laterality Date   CESAREAN SECTION  03/23/2011   DILATION AND EVACUATION N/A 01/22/2021   Procedure: DILATATION AND EVACUATION;  Surgeon: Natale Milch, MD;  Location: [facility] ORS;  Service: Gynecology;  Laterality: N/A;   ESOPHAGOGASTRODUODENOSCOPY  2016   GASTRIC BYPASS  2016   PERINEAL LACERATION REPAIR N/A 11/12/2021   Procedure: SUTURE REPAIR PERINEAL LACERATION;  Surgeon: Warden Fillers, MD;  [facility]: [facility];  Service: Obstetrics;  Laterality: N/A;    Social History   Socioeconomic History    Marital status: Single    Spouse name: aaron   Number of children: 1   Years of education: Not on file   Highest education level: Not on file  Occupational History   Not on file  Tobacco Use   Smoking status: Never   Smokeless tobacco: Never  Vaping Use   Vaping status: Never Used  Substance and Sexual Activity   Alcohol use: No   Drug use: Never   Sexual activity: Yes    Partners: Male    Birth control/protection: None    Comment: hx mirena x 10 yr/removed 05/11/2020  Other Topics Concern   Not on file  Social History Narrative   Lives with mother and child   Social Determinants of Health   Financial Resource Strain: Not on file  Food Insecurity: No Food Insecurity (11/15/2021)   [facility] to [facility]12/[facility]   Hunger Vital Sign    Worried About Running Out of Food in the Last Year: Never true    Ran Out of Food in the Last Year: Never true  Transportation Needs: No Transportation Needs (09/16/2021)   PRAPARE - Administrator, Civil Service (Medical): No    Lack of Transportation (Non-Medical): No  Physical Activity: Not on file  Stress: Not on file  Social Connections: Not on file  Intimate Partner Violence: Not At Risk (11/15/2021)   [facility] prior to [facility]/12/[facility] visits prior to 07/12/2022.   Humiliation, Afraid, Rape, and Kick questionnaire    Fear of Current or Ex-Partner: No    Emotionally Abused: No    Physically Abused: No    Sexually Abused: No    Family History  Problem Relation Age of Onset   Hypertension Mother    Diabetes Mother    Hypertension Father    Diabetes Father    Bipolar disorder Sister    Schizophrenia Sister    Narcolepsy Sister    Healthy Sister    Hypertension Maternal Grandmother    Diabetes Maternal Grandmother     Hypertension Maternal Grandfather    Diabetes Maternal Grandfather    Hypertension Paternal Grandmother    Hypertension Paternal Grandfather      Current Outpatient Medications:    acetaminophen (TYLENOL) 500 MG tablet, Take 2 tablets (1,000 mg total) by mouth every 6 (six) hours as needed for moderate pain, fever, headache or mild pain., Disp: 100 tablet, Rfl: 0   cyclobenzaprine (FLEXERIL) 10 MG tablet, Take 1 tablet (10 mg total) by mouth 3 (three) times daily as needed for muscle spasms., Disp: 30 tablet, Rfl: 2   doxylamine, Sleep, (UNISOM) 25 MG tablet, Take 50 mg by mouth at bedtime as needed., Disp: , Rfl:    ferrous gluconate (FERGON) 324 MG tablet, TAKE 1 TABLET (324 MG TOTAL) BY MOUTH EVERY OTHER DAY, Disp: 90 tablet, Rfl: 1   ibuprofen (ADVIL) 100 MG/5ML suspension, Take 30 mLs (600 mg total) by mouth every 6 (six) hours as needed for mild pain or moderate pain., Disp: 473 mL, Rfl: 2   metoCLOPramide (REGLAN) 10 MG tablet, Take 1 tablet (10 mg total) by mouth 3 (three) times daily before meals. Take 3 times a day for 30 days,Then take 2 a day for 4 days, Then take 1 a day for 4 days, Then stop, Disp: 102 tablet, Rfl: 0   oxyCODONE (OXY IR/ROXICODONE) 5 MG immediate release tablet, Take 1 tablet (5 mg total) by mouth every 6 (six) hours as needed for severe pain or breakthrough pain., Disp: 30 tablet, Rfl: 0   Prenatal Multivit-Min-Fe-FA (PRE-NATAL PO), Take by mouth., Disp: , Rfl:    senna-docusate (SENOKOT-S) 8.6-50 MG tablet, Take 2 tablets by mouth at bedtime as needed for mild constipation or moderate constipation., Disp: 30 tablet, Rfl: 2   sertraline (ZOLOFT) 50 MG tablet, TAKE 1 TABLET BY MOUTH EVERY DAY, Disp: 90 tablet, Rfl: 1   zolpidem (AMBIEN) 5 MG tablet, Take 1 tablet (5 mg total) by mouth at bedtime as needed for sleep., Disp: 30 tablet, Rfl: 1  Physical exam:  Vitals:   12/08/22 1057  BP: 126/83  Pulse: 68  Resp: 18  Temp: (!) 95.9 F (35.5 C)  TempSrc:  Tympanic  SpO2: 100%  Weight: 200 lb 14.4 oz (91.1 kg)  Height: 5\' 7"  (1.702 m)   Physical Exam Cardiovascular:     Rate and Rhythm: Normal rate and regular rhythm.     Heart sounds: Normal heart sounds.  Pulmonary:     Effort: Pulmonary effort is normal.     Breath sounds: Normal breath sounds.  Skin:    General: Skin is warm and dry.  Neurological:     Mental Status: She is alert and oriented to person, place, and time.         Latest Ref Rng & Units 11/11/2021   12:50 AM  CMP  Glucose 70 - 99 mg/dL 77  BUN 6 - 20 mg/dL 6   Creatinine 2.95 - 6.21 mg/dL 3.08   Sodium 657 - 846 mmol/L 137   Potassium 3.5 - 5.1 mmol/L 3.6   Chloride 98 - 111 mmol/L 110   CO2 22 - 32 mmol/L 19   Calcium 8.9 - 10.3 mg/dL 8.5   Total Protein 6.5 - 8.1 g/dL 5.8   Total Bilirubin 0.3 - 1.2 mg/dL 1.0   Alkaline Phos 38 - 126 U/L 127   AST 15 - 41 U/L 25   ALT 0 - 44 U/L 14       Latest Ref Rng & Units 12/08/2022   10:49 AM  CBC  WBC 4.0 - 10.5 K/uL 7.2   Hemoglobin 12.0 - 15.0 g/dL 96.2   Hematocrit 95.2 - 46.0 % 39.6   Platelets 150 - 400 K/uL 268     No images are attached to the encounter.  No results found.   Assessment and plan- Patient is a 37 y.o. female here for routine follow-up of iron deficiency anemia  Based on today's labs patient is not anemic but is iron deficient.  She reports significant fatigue.  We will therefore proceed with Monoferric x 1 dose.  Discussed risks and benefits of IV iron including all but not limited to possible risk of infusion reaction.  Patient understands and agrees to proceed as planned.  Will proceed with CBC with Iron studies in 3 and 6 months and I will see her back in 6 months  Visit Diagnosis 1. Iron deficiency anemia, unspecified iron deficiency anemia type      Dr. Owens Shark, MD, MPH [facility] at [facility] [phone] 12/08/2022 1:51 PM

## 2022-12-08 NOTE — Telephone Encounter (Signed)
-----   Message from [place] sent at 11/26/2022  1:19 PM EDT ----- Regarding: new referral established patient This patient has a new referral to be seen for anemia. I see she is established with you. How would you like to proceed with scheduling?    Thanks! Britta Mccreedy

## 2022-12-15 ENCOUNTER — Inpatient Hospital Stay: Payer: Medicaid Other | Attending: Oncology

## 2022-12-15 VITALS — BP 105/63 | HR 65 | Temp 98.0°F | Resp 16

## 2022-12-15 DIAGNOSIS — D509 Iron deficiency anemia, unspecified: Secondary | ICD-10-CM | POA: Diagnosis present

## 2022-12-15 DIAGNOSIS — O99019 Anemia complicating pregnancy, unspecified trimester: Secondary | ICD-10-CM

## 2022-12-15 MED ORDER — SODIUM CHLORIDE 0.9 % IV SOLN
Freq: Once | INTRAVENOUS | Status: AC
  Filled 2022-12-15: qty 250

## 2022-12-15 MED ORDER — SODIUM CHLORIDE 0.9 % IV SOLN
1000.0000 mg | Freq: Once | INTRAVENOUS | Status: AC
  Administered 2022-12-15: 1000 mg via INTRAVENOUS
  Filled 2022-12-15: qty 1000

## 2022-12-15 NOTE — Patient Instructions (Signed)

## 2023-03-10 ENCOUNTER — Inpatient Hospital Stay: Payer: Medicaid Other | Attending: Oncology

## 2023-03-10 ENCOUNTER — Inpatient Hospital Stay: Payer: Medicaid Other

## 2023-03-10 DIAGNOSIS — D509 Iron deficiency anemia, unspecified: Secondary | ICD-10-CM | POA: Diagnosis present

## 2023-03-10 LAB — CBC
HCT: 40.7 % (ref 36.0–46.0)
Hemoglobin: 13.2 g/dL (ref 12.0–15.0)
MCH: 29.5 pg (ref 26.0–34.0)
MCHC: 32.4 g/dL (ref 30.0–36.0)
MCV: 91.1 fL (ref 80.0–100.0)
Platelets: 211 10*3/uL (ref 150–400)
RBC: 4.47 MIL/uL (ref 3.87–5.11)
RDW: 12.7 % (ref 11.5–15.5)
WBC: 8.9 10*3/uL (ref 4.0–10.5)
nRBC: 0 % (ref 0.0–0.2)

## 2023-03-10 LAB — FERRITIN: Ferritin: 92 ng/mL (ref 11–307)

## 2023-03-10 LAB — IRON AND TIBC
Iron: 90 ug/dL (ref 28–170)
Saturation Ratios: 24 % (ref 10.4–31.8)
TIBC: 375 ug/dL (ref 250–450)
UIBC: 285 ug/dL

## 2023-05-13 NOTE — L&D Delivery Note (Signed)
 OB/GYN Faculty Practice Delivery Note  Haley Kramer is a 38 y.o. [id number] s/p SVD at [redacted]w[redacted]d. She was admitted for IOL for IUFD.   ROM: 0h 88m with clear fluid GBS Status:  N/A Maximum Maternal Temperature: Temp (24hrs), Avg:98.2 F (36.8 C), Min:97.4 F (36.3 C), Max:99.2 F (37.3 C)   Labor Progress: Initial SVE: closed. Received cytotec  x3.  Delivery Date/Time: 09/14/23 0506 Delivery: Patient in bathroom and water broke. Feet quickly delivered. After moving to bed, with maternal effort, rest of body delivered footling breech. Dad cut cord. Placenta did not deliver with gentle traction and maternal effort. Additional dose cytotec  400 mcg given vaginally as patient felt nauseated from buccal dose. Baby Weight: pending  Placenta: Will send to pathology Complications: IUFD Lacerations: None EBL: 300 mL Anesthesia: none  Infant: baby boy Hosea Mac, MD [facility] Family Medicine Fellow, [facility], [facility] 09/14/2023, 6:11 AM

## 2023-05-21 ENCOUNTER — Inpatient Hospital Stay (HOSPITAL_COMMUNITY): Payer: Medicaid Other

## 2023-05-21 ENCOUNTER — Inpatient Hospital Stay (HOSPITAL_COMMUNITY)
Admission: AD | Admit: 2023-05-21 | Discharge: 2023-05-21 | Disposition: A | Discharge status: Home / Self Care | Payer: Medicaid Other | Attending: Obstetrics and Gynecology | Admitting: Obstetrics and Gynecology

## 2023-05-21 ENCOUNTER — Encounter (HOSPITAL_COMMUNITY): Payer: Self-pay

## 2023-05-21 DIAGNOSIS — Z23 Encounter for immunization: Secondary | ICD-10-CM | POA: Insufficient documentation

## 2023-05-21 DIAGNOSIS — O3481 Maternal care for other abnormalities of pelvic organs, first trimester: Secondary | ICD-10-CM | POA: Insufficient documentation

## 2023-05-21 DIAGNOSIS — O09521 Supervision of elderly multigravida, first trimester: Secondary | ICD-10-CM | POA: Insufficient documentation

## 2023-05-21 DIAGNOSIS — Z6791 Unspecified blood type, Rh negative: Secondary | ICD-10-CM

## 2023-05-21 DIAGNOSIS — O26851 Spotting complicating pregnancy, first trimester: Secondary | ICD-10-CM | POA: Diagnosis present

## 2023-05-21 DIAGNOSIS — Z3A01 Less than 8 weeks gestation of pregnancy: Secondary | ICD-10-CM | POA: Diagnosis not present

## 2023-05-21 DIAGNOSIS — N83202 Unspecified ovarian cyst, left side: Secondary | ICD-10-CM | POA: Diagnosis not present

## 2023-05-21 DIAGNOSIS — O26891 Other specified pregnancy related conditions, first trimester: Secondary | ICD-10-CM | POA: Insufficient documentation

## 2023-05-21 DIAGNOSIS — R102 Pelvic and perineal pain: Secondary | ICD-10-CM | POA: Insufficient documentation

## 2023-05-21 DIAGNOSIS — O3680X0 Pregnancy with inconclusive fetal viability, not applicable or unspecified: Secondary | ICD-10-CM | POA: Insufficient documentation

## 2023-05-21 LAB — WET PREP, GENITAL
Clue Cells Wet Prep HPF POC: NONE SEEN
Sperm: NONE SEEN
Trich, Wet Prep: NONE SEEN
WBC, Wet Prep HPF POC: >=10 — AB (ref <10)
Yeast Wet Prep HPF POC: NONE SEEN

## 2023-05-21 LAB — CBC
HCT: 37.6 % (ref 36.0–46.0)
Hemoglobin: 12.4 g/dL (ref 12.0–15.0)
MCH: 30 pg (ref 26.0–34.0)
MCHC: 33 g/dL (ref 30.0–36.0)
MCV: 90.8 fL (ref 80.0–100.0)
Platelets: 245 10*3/uL (ref 150–400)
RBC: 4.14 MIL/uL (ref 3.87–5.11)
RDW: 12.6 % (ref 11.5–15.5)
WBC: 8.6 10*3/uL (ref 4.0–10.5)
nRBC: 0 % (ref 0.0–0.2)

## 2023-05-21 LAB — URINALYSIS, ROUTINE W REFLEX MICROSCOPIC
Bilirubin Urine: NEGATIVE
Glucose, UA: NEGATIVE mg/dL
Ketones, ur: NEGATIVE mg/dL
Nitrite: NEGATIVE
Protein, ur: NEGATIVE mg/dL
Specific Gravity, Urine: 1.015 (ref 1.005–1.030)
pH: 6 (ref 5.0–8.0)

## 2023-05-21 LAB — ABO/RH
ABO/RH(D): O NEG
Antibody Screen: NEGATIVE

## 2023-05-21 LAB — HIV ANTIBODY (ROUTINE TESTING W REFLEX): HIV Screen 4th Generation wRfx: NONREACTIVE

## 2023-05-21 LAB — HCG, QUANTITATIVE, PREGNANCY: hCG, Beta Chain, Quant, S: 1441 m[IU]/mL — ABNORMAL HIGH (ref <5)

## 2023-05-21 LAB — POCT PREGNANCY, URINE: Preg Test, Ur: POSITIVE — AB

## 2023-05-21 MED ORDER — RHO D IMMUNE GLOBULIN 1500 UNIT/2ML IJ SOSY
300.0000 ug | PREFILLED_SYRINGE | Freq: Once | INTRAMUSCULAR | Status: AC
  Administered 2023-05-21: 300 ug via INTRAMUSCULAR
  Filled 2023-05-21: qty 2

## 2023-05-21 NOTE — MAU Note (Signed)
...  Haley Kramer is a 38 y.o. at Unknown here in MAU reporting: Umbilical pain and vaginal bleeding. She reports both of her sx's began this past Monday. She reports she did not have any blood Tuesday but reports today her vaginal bleeding returned and is light and pink. She only sees it when she wipes. Denies recent IC. Denies vaginal itching and vaginal odors.   Had her gallbladder removed over the summer. Has been lifting her child who is 22 lbs. Has an incision at her umbilicus.  Concerned about Progesterone  as she took it in her last pregnancy. RH Neg.  LMP: 04/15/2024 Onset of complaint: This past Monday  Pain score: 6/10 umbilicus  Vitals:   05/21/23 1420  Pulse: 76  Resp: 18  Temp: 98.2 F (36.8 C)  SpO2: 100%      FHT: n/a Lab orders placed from triage: POCT Preg

## 2023-05-21 NOTE — MAU Provider Note (Signed)
 History     CSN: [id number]  Arrival date and time: 05/21/23 1409   Event Date/Time   First Provider Initiated Contact with Patient 05/21/23 1746      Chief Complaint  Patient presents with   Abdominal Pain   Vaginal Bleeding   HPI Ms. Haley Kramer is a 38 y.o. year old G25P2012 female at [redacted]w[redacted]d weeks gestation who presents to MAU reporting pain around her umbilicus; rated 6/10. She reports VB since Monday 05/18/2023. She reports the VB stopped on Tuesday 05/19/2023. She started having VB today; light pink with wiping only. She denies any recent SI. She denies abnormal vaginal sx's. She had her gallbladder removed this summer and is also concerned that she will need to take progesterone  like she did with her last pregnancy. She plans to receive [facility]; next appt is 06/11/2023.    OB History     Gravida  4   Para  2   Term  2   Preterm  0   AB  1   Living  2      SAB  1   IAB  0   Ectopic  0   Multiple  0   Live Births  2           Past Medical History:  Diagnosis Date   IDA (iron  deficiency anemia)    Incomplete abortion 01/14/2021   Iron  deficiency anemia, unspecified iron  deficiency anemia type    Obesity    PONV (postoperative nausea and vomiting)    Vitamin D  deficiency     Past Surgical History:  Procedure Laterality Date   CESAREAN SECTION  03/23/2011   DILATION AND EVACUATION N/A 01/22/2021   Procedure: DILATATION AND EVACUATION;  Surgeon: Victor Claudell JONELLE, MD;  Location: [facility] ORS;  Service: Gynecology;  Laterality: N/A;   ESOPHAGOGASTRODUODENOSCOPY  2016   GASTRIC BYPASS  2016   PERINEAL LACERATION REPAIR N/A 11/12/2021   Procedure: SUTURE REPAIR PERINEAL LACERATION;  Surgeon: Zina Jerilynn LABOR, MD;  Location: [facility]S;  Service: Obstetrics;  Laterality: N/A;    Family History  Problem Relation Age of Onset   Hypertension Mother    Diabetes Mother    Hypertension Father    Diabetes Father    Bipolar disorder Sister     Schizophrenia Sister    Narcolepsy Sister    Healthy Sister    Hypertension Maternal Grandmother    Diabetes Maternal Grandmother    Hypertension Maternal Grandfather    Diabetes Maternal Grandfather    Hypertension Paternal Grandmother    Hypertension Paternal Grandfather     Social History   Tobacco Use   Smoking status: Never   Smokeless tobacco: Never  Vaping Use   Vaping status: Never Used  Substance Use Topics   Alcohol use: No   Drug use: Never    Allergies: No Known Allergies  Medications Prior to Admission  Medication Sig Dispense Refill Last Dose/Taking   Prenatal Multivit-Min-Fe-FA (PRE-NATAL PO) Take by mouth.   05/20/2023   acetaminophen  (TYLENOL ) 500 MG tablet Take 2 tablets (1,000 mg total) by mouth every 6 (six) hours as needed for moderate pain, fever, headache or mild pain. 100 tablet 0    cyclobenzaprine  (FLEXERIL ) 10 MG tablet Take 1 tablet (10 mg total) by mouth 3 (three) times daily as needed for muscle spasms. 30 tablet 2    doxylamine , Sleep, (UNISOM ) 25 MG tablet Take 50 mg by mouth at bedtime as needed.  ferrous gluconate  (FERGON) 324 MG tablet TAKE 1 TABLET (324 MG TOTAL) BY MOUTH EVERY OTHER DAY 90 tablet 1    ibuprofen  (ADVIL ) 100 MG/5ML suspension Take 30 mLs (600 mg total) by mouth every 6 (six) hours as needed for mild pain or moderate pain. 473 mL 2    metoCLOPramide  (REGLAN ) 10 MG tablet Take 1 tablet (10 mg total) by mouth 3 (three) times daily before meals. Take 3 times a day for 30 days,Then take 2 a day for 4 days, Then take 1 a day for 4 days, Then stop 102 tablet 0    oxyCODONE  (OXY IR/ROXICODONE ) 5 MG immediate release tablet Take 1 tablet (5 mg total) by mouth every 6 (six) hours as needed for severe pain or breakthrough pain. 30 tablet 0    senna-docusate (SENOKOT-S) 8.6-50 MG tablet Take 2 tablets by mouth at bedtime as needed for mild constipation or moderate constipation. 30 tablet 2    sertraline  (ZOLOFT ) 50 MG tablet TAKE 1 TABLET  BY MOUTH EVERY DAY 90 tablet 1    zolpidem  (AMBIEN ) 5 MG tablet Take 1 tablet (5 mg total) by mouth at bedtime as needed for sleep. 30 tablet 1     Review of Systems  Constitutional: Negative.   HENT: Negative.    Eyes: Negative.   Respiratory: Negative.    Cardiovascular: Negative.   Gastrointestinal: Negative.   Endocrine: Negative.   Genitourinary:  Positive for pelvic pain (cramping) and vaginal bleeding (pink spotting with wiping).  Musculoskeletal: Negative.   Skin: Negative.   Allergic/Immunologic: Negative.   Neurological: Negative.   Hematological: Negative.   Psychiatric/Behavioral: Negative.     Physical Exam   Blood pressure 124/73, pulse 76, temperature 98.2 F (36.8 C), temperature source Oral, resp. rate 18, height 5' 7 (1.702 m), weight 90.6 kg, last menstrual period 04/16/2023, SpO2 100%, unknown if currently breastfeeding.  Physical Exam Vitals and nursing note reviewed.  Constitutional:      Appearance: Normal appearance. She is obese.  Cardiovascular:     Rate and Rhythm: Normal rate.  Pulmonary:     Effort: Pulmonary effort is normal.  Genitourinary:    Comments: Swabs collected by patient using blind swab technique  Musculoskeletal:        General: Normal range of motion.  Neurological:     Mental Status: She is alert and oriented to person, place, and time.  Psychiatric:        Mood and Affect: Mood normal.        Behavior: Behavior normal.        Thought Content: Thought content normal.        Judgment: Judgment normal.    MAU Course  Procedures  MDM CCUA UPT CBC ABO/Rh HCG Wet Prep GC/CT -- Results pending  RPR -- Results pending  OB U/S < 14 wks TVUS  Results for orders placed or performed during the hospital encounter of 05/21/23 (from the past 24 hours)  Urinalysis, Routine w reflex microscopic -Urine, Clean Catch     Status: Abnormal   Collection Time: 05/21/23  2:37 PM  Result Value Ref Range   Color, Urine YELLOW YELLOW    APPearance CLEAR CLEAR   Specific Gravity, Urine 1.015 1.005 - 1.030   pH 6.0 5.0 - 8.0   Glucose, UA NEGATIVE NEGATIVE mg/dL   Hgb urine dipstick SMALL (A) NEGATIVE   Bilirubin Urine NEGATIVE NEGATIVE   Ketones, ur NEGATIVE NEGATIVE mg/dL   Protein, ur NEGATIVE NEGATIVE mg/dL  Nitrite NEGATIVE NEGATIVE   Leukocytes,Ua TRACE (A) NEGATIVE   RBC / HPF 0-5 0 - 5 RBC/hpf   WBC, UA 0-5 0 - 5 WBC/hpf   Bacteria, UA RARE (A) NONE SEEN   Squamous Epithelial / HPF 0-5 0 - 5 /HPF   Mucus PRESENT   Pregnancy, urine POC     Status: Abnormal   Collection Time: 05/21/23  2:38 PM  Result Value Ref Range   Preg Test, Ur POSITIVE (A) NEGATIVE  Wet prep, genital     Status: Abnormal   Collection Time: 05/21/23  3:07 PM   Specimen: PATH Cytology Cervicovaginal Ancillary Only  Result Value Ref Range   Yeast Wet Prep HPF POC NONE SEEN NONE SEEN   Trich, Wet Prep NONE SEEN NONE SEEN   Clue Cells Wet Prep HPF POC NONE SEEN NONE SEEN   WBC, Wet Prep HPF POC >=10 (A) <10   Sperm NONE SEEN   CBC     Status: None   Collection Time: 05/21/23  3:28 PM  Result Value Ref Range   WBC 8.6 4.0 - 10.5 K/uL   RBC 4.14 3.87 - 5.11 MIL/uL   Hemoglobin 12.4 12.0 - 15.0 g/dL   HCT 62.3 63.9 - 53.9 %   MCV 90.8 80.0 - 100.0 fL   MCH 30.0 26.0 - 34.0 pg   MCHC 33.0 30.0 - 36.0 g/dL   RDW 87.3 88.4 - 84.4 %   Platelets 245 150 - 400 K/uL   nRBC 0.0 0.0 - 0.2 %  ABO/Rh     Status: None   Collection Time: 05/21/23  3:28 PM  Result Value Ref Range   ABO/RH(D) O NEG    Antibody Screen      NEG Performed at [facility], [address], [place], [place] [address]   hCG, quantitative, pregnancy     Status: Abnormal   Collection Time: 05/21/23  3:28 PM  Result Value Ref Range   hCG, Beta Chain, Quant, S 1,441 (H) <5 mIU/mL  Rh IG workup (includes ABO/Rh)     Status: None   Collection Time: 05/21/23  5:39 PM  Result Value Ref Range   Gestational Age(Wks)      5 Performed at [facility],  [address], [place], [place] [address]     US  OB LESS THAN 14 WEEKS WITH OB TRANSVAGINAL Result Date: 05/21/2023 CLINICAL DATA:  Vaginal bleeding and umbilical pain. Beta hCG is not available at the time of interpretation. EXAM: OBSTETRIC <14 WK US  AND TRANSVAGINAL OB US  TECHNIQUE: Both transabdominal and transvaginal ultrasound examinations were performed for complete evaluation of the gestation as well as the maternal uterus, adnexal regions, and pelvic cul-de-sac. Transvaginal technique was performed to assess early pregnancy. COMPARISON:  None Available. FINDINGS: Intrauterine gestational sac: Single Yolk sac:  Not Visualized. Embryo:  Not Visualized. MSD: 3.7 mm   5 w   1 d Subchorionic hemorrhage: Small subchorionic hemorrhage measures 6 x 4 x 3 mm. Maternal uterus/adnexae: Normal ovaries. Left ovarian cyst measuring 2.0 cm contains avascular internal septation, likely hemorrhagic. Right ovarian corpus luteum. IMPRESSION: Probable early intrauterine gestational sac, but no yolk sac, fetal pole, or cardiac activity yet visualized. Recommend follow-up quantitative B-HCG levels and follow-up US  in 14 days to assess viability. This recommendation follows SRU consensus guidelines: Diagnostic Criteria for Nonviable Pregnancy Early in the First Trimester. LOISE Diedra PARAS Med 2013; 630:8556-48. Electronically Signed   By: Limin  Xu M.D.   On:  05/21/2023 16:34    Assessment and Plan  1. Pregnancy with uncertain fetal viability, single or unspecified fetus (Primary) - Repeat U/S in 2 wks - Note to be OOW until Monday 05/25/2023  2. Spotting affecting pregnancy in first trimester - Information provided on VB in pregnancy   3. Rh negative status during pregnancy in first trimester - Received Rhophylac  injection  4. [redacted] weeks gestation of pregnancy   - Discharge patient - Message sent to [facility] to get patient scheduled for viability U/S the week of 06/04/2023 - Patient verbalized an understanding of the  plan of care and agrees.  Ala Cart, CNM 05/21/2023, 5:46 PM

## 2023-05-21 NOTE — Discharge Instructions (Signed)
 Return to MAU: If you have heavier bleeding that soaks through more that 2 pads per hour for an hour or more If you bleed so much that you feel like you might pass out or you do pass out If you have significant abdominal pain that is not improved with Tylenol 1000 mg every 8 hours as needed for pain If you develop a fever > 100.5

## 2023-05-22 LAB — RH IG WORKUP (INCLUDES ABO/RH)
Gestational Age(Wks): 5
Unit division: 0

## 2023-05-22 LAB — GC/CHLAMYDIA PROBE AMP (~~LOC~~) NOT AT ARMC
Chlamydia: NEGATIVE
Comment: NEGATIVE
Comment: NORMAL
Neisseria Gonorrhea: NEGATIVE

## 2023-05-22 LAB — RPR: RPR Ser Ql: NONREACTIVE

## 2023-05-30 ENCOUNTER — Telehealth: Payer: Medicaid Other

## 2023-05-30 DIAGNOSIS — Z20828 Contact with and (suspected) exposure to other viral communicable diseases: Secondary | ICD-10-CM

## 2023-05-31 MED ORDER — OSELTAMIVIR PHOSPHATE 75 MG PO CAPS: 75.0000 mg | ORAL_CAPSULE | Freq: Every day | ORAL | 0 refills | Status: AC

## 2023-05-31 NOTE — Progress Notes (Signed)
I have spent 5 minutes in review of e-visit questionnaire, review and updating patient chart, medical decision making and response to patient.   Laure Kidney, PA-C

## 2023-05-31 NOTE — Progress Notes (Signed)

## 2023-06-04 ENCOUNTER — Other Ambulatory Visit (INDEPENDENT_AMBULATORY_CARE_PROVIDER_SITE_OTHER): Payer: Medicaid Other

## 2023-06-04 ENCOUNTER — Ambulatory Visit: Payer: Medicaid Other | Admitting: *Deleted

## 2023-06-04 VITALS — BP 119/74 | HR 70 | Ht 68.0 in | Wt 202.2 lb

## 2023-06-04 DIAGNOSIS — O0991 Supervision of high risk pregnancy, unspecified, first trimester: Secondary | ICD-10-CM

## 2023-06-04 DIAGNOSIS — Z3A01 Less than 8 weeks gestation of pregnancy: Secondary | ICD-10-CM

## 2023-06-04 DIAGNOSIS — O0990 Supervision of high risk pregnancy, unspecified, unspecified trimester: Secondary | ICD-10-CM

## 2023-06-04 DIAGNOSIS — Z1339 Encounter for screening examination for other mental health and behavioral disorders: Secondary | ICD-10-CM | POA: Diagnosis not present

## 2023-06-04 DIAGNOSIS — O3680X0 Pregnancy with inconclusive fetal viability, not applicable or unspecified: Secondary | ICD-10-CM

## 2023-06-04 MED ORDER — PRENATE PIXIE 10-0.6-0.4-200 MG PO CAPS: 1.0000 | ORAL_CAPSULE | Freq: Every day | ORAL | 12 refills | Status: DC

## 2023-06-04 MED ORDER — DOXYLAMINE-PYRIDOXINE 10-10 MG PO TBEC: 2.0000 | DELAYED_RELEASE_TABLET | Freq: Every day | ORAL | 5 refills | Status: DC

## 2023-06-04 MED ORDER — PROMETHAZINE HCL 25 MG PO TABS: 25.0000 mg | ORAL_TABLET | Freq: Four times a day (QID) | ORAL | 1 refills | Status: DC | PRN

## 2023-06-04 NOTE — Patient Instructions (Signed)

## 2023-06-04 NOTE — Progress Notes (Cosign Needed Addendum)
New OB Intake  I connected with Haley Kramer  on 06/04/23 at  8:15 AM EST by In Person Visit and verified that I am speaking with the correct person using two identifiers. Nurse is located at [facility] and pt is located at [place].  I discussed the limitations, risks, security and privacy concerns of performing an evaluation and management service by telephone and the availability of in person appointments. I also discussed with the patient that there may be a patient responsible charge related to this service. The patient expressed understanding and agreed to proceed.  I explained I am completing New OB Intake today. We discussed EDD of 01/21/2024, by Last Menstrual Period. Pt is [name]. I reviewed her allergies, medications and Medical/Surgical/OB history.    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 Discussed the nature of our practice with multiple providers including residents and students. Due to the size of the practice, the delivering provider may not be the same as those providing prenatal care.   Patient is not interested in water birth. Offered upcoming OB visit with CNM to discuss further.  MyChart/Babyscripts MyChart access verified. I explained pt will have some visits in office and some virtually. Babyscripts instructions given  and order placed. Patient verifies receipt of registration text/e-mail. Account successfully created and app downloaded. If patient is a candidate for Optimized scheduling, add to sticky note.   Blood Pressure Cuff/Weight Scale Pt has BP cuff at home Explained after first prenatal appt pt will check weekly and document in Babyscripts. Patient does not have weight scale; patient may purchase if they desire to track weight weekly in Babyscripts.  Anatomy US Explained first scheduled [place] will be around 19 weeks. Anatomy US scheduled for [name] at [name].  Interested in [name]? If yes, send referral and doula dot phrase.   Is patient a candidate for Babyscripts Optimization? No, due to Risk Factors   First visit review I reviewed new OB appt with patient. Explained pt will be seen by Haley Kramer, CNM at first visit. Discussed Haley Kramer genetic screening with patient. Requests Panorama. Routine prenatal labs  OB Urine only today. OB Panel, CMP, Hgb A1C, Natera, GC/CC deferred to New OB.    Last Pap No results found for: "DIAGPAP"  Harrel Lemon, RN 06/04/2023  8:31 AM

## 2023-06-06 LAB — CULTURE, OB URINE

## 2023-06-06 LAB — URINE CULTURE, OB REFLEX

## 2023-06-08 ENCOUNTER — Encounter: Payer: Self-pay | Admitting: Obstetrics and Gynecology

## 2023-06-08 ENCOUNTER — Other Ambulatory Visit: Payer: Self-pay | Admitting: *Deleted

## 2023-06-08 DIAGNOSIS — O0990 Supervision of high risk pregnancy, unspecified, unspecified trimester: Secondary | ICD-10-CM

## 2023-06-12 ENCOUNTER — Inpatient Hospital Stay: Payer: Medicaid Other | Admitting: Oncology

## 2023-06-12 ENCOUNTER — Inpatient Hospital Stay: Payer: Medicaid Other

## 2023-06-18 ENCOUNTER — Other Ambulatory Visit (HOSPITAL_COMMUNITY)
Admission: RE | Admit: 2023-06-18 | Discharge: 2023-06-18 | Disposition: A | Discharge status: Home / Self Care | Payer: Medicaid Other | Source: Ambulatory Visit

## 2023-06-18 ENCOUNTER — Ambulatory Visit (INDEPENDENT_AMBULATORY_CARE_PROVIDER_SITE_OTHER): Payer: Medicaid Other

## 2023-06-18 ENCOUNTER — Other Ambulatory Visit (INDEPENDENT_AMBULATORY_CARE_PROVIDER_SITE_OTHER): Payer: Self-pay

## 2023-06-18 VITALS — BP 111/64 | HR 82 | Wt 203.8 lb

## 2023-06-18 DIAGNOSIS — F32A Depression, unspecified: Secondary | ICD-10-CM

## 2023-06-18 DIAGNOSIS — Z3481 Encounter for supervision of other normal pregnancy, first trimester: Secondary | ICD-10-CM | POA: Diagnosis not present

## 2023-06-18 DIAGNOSIS — Z362 Encounter for other antenatal screening follow-up: Secondary | ICD-10-CM

## 2023-06-18 DIAGNOSIS — Z3A09 9 weeks gestation of pregnancy: Secondary | ICD-10-CM | POA: Diagnosis not present

## 2023-06-18 DIAGNOSIS — O368390 Maternal care for abnormalities of the fetal heart rate or rhythm, unspecified trimester, not applicable or unspecified: Secondary | ICD-10-CM

## 2023-06-18 DIAGNOSIS — O99341 Other mental disorders complicating pregnancy, first trimester: Secondary | ICD-10-CM

## 2023-06-18 DIAGNOSIS — O09511 Supervision of elderly primigravida, first trimester: Secondary | ICD-10-CM

## 2023-06-18 DIAGNOSIS — O0991 Supervision of high risk pregnancy, unspecified, first trimester: Secondary | ICD-10-CM

## 2023-06-18 DIAGNOSIS — O26891 Other specified pregnancy related conditions, first trimester: Secondary | ICD-10-CM | POA: Diagnosis not present

## 2023-06-18 DIAGNOSIS — Z98891 History of uterine scar from previous surgery: Secondary | ICD-10-CM

## 2023-06-18 DIAGNOSIS — O0990 Supervision of high risk pregnancy, unspecified, unspecified trimester: Secondary | ICD-10-CM | POA: Diagnosis present

## 2023-06-18 DIAGNOSIS — O09521 Supervision of elderly multigravida, first trimester: Secondary | ICD-10-CM

## 2023-06-18 DIAGNOSIS — Z9884 Bariatric surgery status: Secondary | ICD-10-CM

## 2023-06-18 DIAGNOSIS — O26899 Other specified pregnancy related conditions, unspecified trimester: Secondary | ICD-10-CM

## 2023-06-18 DIAGNOSIS — Z6791 Unspecified blood type, Rh negative: Secondary | ICD-10-CM

## 2023-06-18 NOTE — Progress Notes (Signed)
 Subjective:   Haley Kramer is a 38 y.o. [id number] at [redacted]w[redacted]d by Definite LMP of 04/16/2023 being seen today for her first obstetrical visit.  Patient states this was an unplanned pregnancy.  Patient reports she was on oral contraception prior to conception and recalls skipping some pills. She desires Mirena  after delivery  Gynecological/Obstetrical History: Patient reports no history of gynecological surgeries.  Patient denies history of abnormal pap smears. Last pap Normal/Normal 10/29/2020.  Pregnancy history fully reviewed. Patient does intend to breast feed. Patient obstetrical history is significant for  h/o C/S, h/o VBAC with vacuum extraction .  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    Sexual Activity and Vaginal Concerns: Patient is not currently sexually active.  She denies vaginal discharge, bleeding, irritation, or odor. Patient also denies pain or difficulty with urination.    Medical History/ROS: Patient denies medical history significant for cardiovascular, respiratory, gastrointestinal, or hematological disorders. Patient endorses history of depression and is not currently taking any medications or seeing a therapist.   Patient reports nausea/vomiting that is relieved with promethazine  and diclegis .  Patient denies constipation/diarrhea.  No new onset or recurrent headaches.    Social History: Patient denies history or current usage of tobacco, alcohol, vaping, or drugs.  Patient reports the FOB is Beverley who is involves and supportive.  Patient reports that she lives with Beverley and children and endorses safety at home.  Patient denies DV/A. 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  HISTORY: OB History  Gravida Para Term Preterm AB Living  5 2 2  0 2 2  SAB IAB Ectopic Multiple Live Births  2 0 0 0 2    # Outcome Date GA Lbr Len/2nd Weight Sex Type Anes PTL Lv  5 Current           4 SAB 12/2022     SAB     3  Term 11/12/21 [redacted]w[redacted]d 06:19 / 00:56 5 lb 11.7 oz (2.6 kg) M VBAC, Vacuum EPI  LIV     Name: HUNTER,REDDINGTON JON LEE     Apgar1: 2  Apgar5: 3  2 SAB 01/2021          1 Term 03/23/11    F CS-LTranv   LIV    Last pap smear was done June 2022 and was normal  Past Medical History:  Diagnosis Date   IDA (iron  deficiency anemia)    Incomplete abortion 01/14/2021   Iron  deficiency anemia, unspecified iron  deficiency anemia type    Obesity    PONV (postoperative nausea and vomiting)    Vitamin D  deficiency    Past Surgical History:  Procedure Laterality Date   CESAREAN SECTION  03/23/2011   CHOLECYSTECTOMY  11/2022   DILATION AND EVACUATION N/A 01/22/2021   Procedure: DILATATION AND EVACUATION;  Surgeon: Victor Claudell JONELLE, MD;  Location: [facility] ORS;  Service: Gynecology;  Laterality: N/A;   ESOPHAGOGASTRODUODENOSCOPY  2016   gallbladder     GASTRIC BYPASS  2016   PERINEAL LACERATION REPAIR N/A 11/12/2021   Procedure: SUTURE REPAIR PERINEAL LACERATION;  Surgeon: Zina Jerilynn LABOR, MD;  Location: [facility];  Service: Obstetrics;  Laterality: N/A;   Family History  Problem Relation Age of Onset   Hypertension Mother    Diabetes Mother    Hypertension Father    Diabetes Father    Bipolar disorder Sister    Schizophrenia Sister    Narcolepsy Sister    Healthy Sister  Hypertension Maternal Grandmother    Diabetes Maternal Grandmother    Hypertension Maternal Grandfather    Diabetes Maternal Grandfather    Hypertension Paternal Grandmother    Hypertension Paternal Grandfather    Social History   Tobacco Use   Smoking status: Never   Smokeless tobacco: Never  Vaping Use   Vaping status: Never Used  Substance Use Topics   Alcohol use: No   Drug use: Never   No Known Allergies Current Outpatient Medications on File Prior to Visit  Medication Sig Dispense Refill   acetaminophen  (TYLENOL ) 500 MG tablet Take 2 tablets (1,000 mg total) by mouth every 6 (six) hours as needed  for moderate pain, fever, headache or mild pain. 100 tablet 0   cyclobenzaprine  (FLEXERIL ) 10 MG tablet Take 1 tablet (10 mg total) by mouth 3 (three) times daily as needed for muscle spasms. 30 tablet 2   doxylamine , Sleep, (UNISOM ) 25 MG tablet Take 50 mg by mouth at bedtime as needed.     Doxylamine -Pyridoxine  (DICLEGIS ) 10-10 MG TBEC Take 2 tablets by mouth at bedtime. If symptoms persist, add one tablet in the morning and one in the afternoon 100 tablet 5   ferrous gluconate  (FERGON) 324 MG tablet TAKE 1 TABLET (324 MG TOTAL) BY MOUTH EVERY OTHER DAY 90 tablet 1   Prenat-FeAsp-Meth-FA-DHA w/o A (PRENATE PIXIE ) 10-0.6-0.4-200 MG CAPS Take 1 capsule by mouth daily. 30 capsule 12   Prenatal Multivit-Min-Fe-FA (PRE-NATAL PO) Take by mouth.     promethazine  (PHENERGAN ) 25 MG tablet Take 1 tablet (25 mg total) by mouth every 6 (six) hours as needed for nausea or vomiting. 30 tablet 1   sertraline  (ZOLOFT ) 50 MG tablet TAKE 1 TABLET BY MOUTH EVERY DAY 90 tablet 1   zolpidem  (AMBIEN ) 5 MG tablet Take 1 tablet (5 mg total) by mouth at bedtime as needed for sleep. 30 tablet 1   No current facility-administered medications on file prior to visit.    Review of Systems Pertinent items noted in HPI and remainder of comprehensive ROS otherwise negative.  Exam   Vitals:   06/18/23 0840  BP: 111/64  Pulse: 82  Weight: 203 lb 12.8 oz (92.4 kg)      Physical Exam Constitutional:      Appearance: Normal appearance.  HENT:     Head: Normocephalic and atraumatic.  Eyes:     Conjunctiva/sclera: Conjunctivae normal.  Cardiovascular:     Rate and Rhythm: Normal rate and regular rhythm.     Heart sounds: Normal heart sounds.  Pulmonary:     Effort: Pulmonary effort is normal.     Breath sounds: Normal breath sounds.  Musculoskeletal:        General: Normal range of motion.     Cervical back: Normal range of motion.  Neurological:     Mental Status: She is alert and oriented to person, place,  and time.  Skin:    General: Skin is warm and dry.  Psychiatric:        Mood and Affect: Mood normal.        Behavior: Behavior normal.  Vitals reviewed.     Assessment:   38 y.o. 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 Supervision of high risk pregnancy, antepartum -Congratulations given and patient welcomed to practice. -Discussed potential usage of Babyscripts and virtual visits as additional source of managing and completing PN visits.   -Reviewed prenatal visit schedule and platforms used for virtual visits.  -Anticipatory guidance for interventions during prenatal visits including labs, ultrasounds, and testing; Initial labs Ordered and drawn. -Genetic Screening discussed, First trimester screen, Quad screen, and NIPS: ordered. -Ultrasound discussed; fetal anatomic survey: scheduled for September 01, 2023 -Discussed estimated due date of Sept 11, 2025. -Continue prenatal vitamins  -Influenza offered and declined. -Encouraged to seek out care at office or emergency room for urgent and/or emergent concerns. -Educated on the nature of [facility] with multiple MDs and other Advanced Practice Providers was explained to patient; also emphasized that residents, students are part of our team. Informed of her right to refuse care as she deems appropriate.  -Encouraged to complete and utilize MyChart Registration for her ability to review  results, send requests, and have questions addressed.  -No questions or concerns.    2. History of VBAC -Desires repeat VBAC  3. Multigravida of advanced maternal age in first trimester -38 years old  4. Rh negative, antepartum -Plan to give rhogam 28-30 weeks -PP if needed  5. History of bariatric surgery -Has completed GTT without issues. -HgB A1C ordered  6. Encounter for repeat ultrasound for fetal heart rate -US  to confirm FHT considering early GA and body habitus. -Reports FHTs 170s   7. [redacted] weeks gestation of pregnancy -Will hold on panorama and plan to repeat at next visit.    8. Depression during pregnancy in first trimester -PHQ/GAD 12 -Referral for IBH placed.    Problem list reviewed and updated. Routine obstetric precautions reviewed.  No orders of the defined types were placed in this encounter.   No follow-ups on file.     Harlene LITTIE Duncans, CNM 06/18/2023 9:08 AM

## 2023-06-18 NOTE — Progress Notes (Signed)
 Pt presents for NOB. No questions or concerns.

## 2023-06-19 LAB — CBC/D/PLT+RPR+RH+ABO+RUBIGG...
Antibody Screen: NEGATIVE
Basophils Absolute: 0 10*3/uL (ref 0.0–0.2)
Basos: 0 %
EOS (ABSOLUTE): 0.1 10*3/uL (ref 0.0–0.4)
Eos: 1 %
HCV Ab: NONREACTIVE
HIV Screen 4th Generation wRfx: NONREACTIVE
Hematocrit: 38.6 % (ref 34.0–46.6)
Hemoglobin: 12.8 g/dL (ref 11.1–15.9)
Hepatitis B Surface Ag: NEGATIVE
Immature Grans (Abs): 0 10*3/uL (ref 0.0–0.1)
Immature Granulocytes: 0 %
Lymphocytes Absolute: 2.2 10*3/uL (ref 0.7–3.1)
Lymphs: 28 %
MCH: 30.6 pg (ref 26.6–33.0)
MCHC: 33.2 g/dL (ref 31.5–35.7)
MCV: 92 fL (ref 79–97)
Monocytes Absolute: 0.6 10*3/uL (ref 0.1–0.9)
Monocytes: 7 %
Neutrophils Absolute: 5.1 10*3/uL (ref 1.4–7.0)
Neutrophils: 64 %
Platelets: 217 10*3/uL (ref 150–450)
RBC: 4.18 x10E6/uL (ref 3.77–5.28)
RDW: 11.9 % (ref 11.7–15.4)
RPR Ser Ql: NONREACTIVE
Rh Factor: NEGATIVE
Rubella Antibodies, IGG: 4.93 {index} (ref >0.99)
WBC: 8 10*3/uL (ref 3.4–10.8)

## 2023-06-19 LAB — COMPREHENSIVE METABOLIC PANEL
ALT: 66 [IU]/L — ABNORMAL HIGH (ref 0–32)
AST: 60 [IU]/L — ABNORMAL HIGH (ref 0–40)
Albumin: 4.2 g/dL (ref 3.9–4.9)
Alkaline Phosphatase: 94 [IU]/L (ref 44–121)
BUN/Creatinine Ratio: 14 (ref 9–23)
BUN: 6 mg/dL (ref 6–20)
Bilirubin Total: 0.4 mg/dL (ref 0.0–1.2)
CO2: 20 mmol/L (ref 20–29)
Calcium: 8.8 mg/dL (ref 8.7–10.2)
Chloride: 105 mmol/L (ref 96–106)
Creatinine, Ser: 0.44 mg/dL — ABNORMAL LOW (ref 0.57–1.00)
Globulin, Total: 2.1 g/dL (ref 1.5–4.5)
Glucose: 76 mg/dL (ref 70–99)
Potassium: 3.7 mmol/L (ref 3.5–5.2)
Sodium: 138 mmol/L (ref 134–144)
Total Protein: 6.3 g/dL (ref 6.0–8.5)
eGFR: 128 mL/min/{1.73_m2} (ref >59)

## 2023-06-19 LAB — CERVICOVAGINAL ANCILLARY ONLY
Chlamydia: NEGATIVE
Comment: NEGATIVE
Comment: NEGATIVE
Comment: NORMAL
Neisseria Gonorrhea: NEGATIVE
Trichomonas: NEGATIVE

## 2023-06-19 LAB — HEMOGLOBIN A1C
Est. average glucose Bld gHb Est-mCnc: 85 mg/dL
Hgb A1c MFr Bld: 4.6 % — ABNORMAL LOW (ref 4.8–5.6)

## 2023-06-19 LAB — HCV INTERPRETATION

## 2023-06-23 ENCOUNTER — Ambulatory Visit: Payer: Medicaid Other | Admitting: Licensed Clinical Social Worker

## 2023-06-23 ENCOUNTER — Ambulatory Visit (HOSPITAL_COMMUNITY)
Admission: RE | Admit: 2023-06-23 | Discharge: 2023-06-23 | Disposition: A | Discharge status: Home / Self Care | Payer: Medicaid Other | Source: Ambulatory Visit

## 2023-06-23 DIAGNOSIS — O0990 Supervision of high risk pregnancy, unspecified, unspecified trimester: Secondary | ICD-10-CM | POA: Insufficient documentation

## 2023-06-23 DIAGNOSIS — O368390 Maternal care for abnormalities of the fetal heart rate or rhythm, unspecified trimester, not applicable or unspecified: Secondary | ICD-10-CM | POA: Insufficient documentation

## 2023-06-23 NOTE — BH Specialist Note (Signed)
Patient no showed for today's appointment. A virtual link was sent and a phone call was provided. A VM was left.

## 2023-06-25 ENCOUNTER — Encounter: Payer: Medicaid Other | Admitting: Obstetrics and Gynecology

## 2023-06-26 DIAGNOSIS — R7989 Other specified abnormal findings of blood chemistry: Secondary | ICD-10-CM | POA: Insufficient documentation

## 2023-07-02 ENCOUNTER — Other Ambulatory Visit: Payer: Medicaid Other

## 2023-07-06 ENCOUNTER — Inpatient Hospital Stay: Payer: Medicaid Other | Admitting: Oncology

## 2023-07-06 ENCOUNTER — Inpatient Hospital Stay: Payer: Medicaid Other

## 2023-07-07 ENCOUNTER — Inpatient Hospital Stay: Payer: Medicaid Other | Attending: Oncology

## 2023-07-07 DIAGNOSIS — Z3A12 12 weeks gestation of pregnancy: Secondary | ICD-10-CM | POA: Diagnosis not present

## 2023-07-07 DIAGNOSIS — D509 Iron deficiency anemia, unspecified: Secondary | ICD-10-CM | POA: Diagnosis present

## 2023-07-07 DIAGNOSIS — O99011 Anemia complicating pregnancy, first trimester: Secondary | ICD-10-CM | POA: Diagnosis present

## 2023-07-07 LAB — IRON AND TIBC
Iron: 127 ug/dL (ref 28–170)
Saturation Ratios: 30 % (ref 10.4–31.8)
TIBC: 426 ug/dL (ref 250–450)
UIBC: 299 ug/dL

## 2023-07-07 LAB — CBC
HCT: 37.2 % (ref 36.0–46.0)
Hemoglobin: 12.5 g/dL (ref 12.0–15.0)
MCH: 30.6 pg (ref 26.0–34.0)
MCHC: 33.6 g/dL (ref 30.0–36.0)
MCV: 91.2 fL (ref 80.0–100.0)
Platelets: 181 10*3/uL (ref 150–400)
RBC: 4.08 MIL/uL (ref 3.87–5.11)
RDW: 12.2 % (ref 11.5–15.5)
WBC: 8.4 10*3/uL (ref 4.0–10.5)
nRBC: 0 % (ref 0.0–0.2)

## 2023-07-07 LAB — FERRITIN: Ferritin: 53 ng/mL (ref 11–307)

## 2023-07-10 ENCOUNTER — Inpatient Hospital Stay (HOSPITAL_BASED_OUTPATIENT_CLINIC_OR_DEPARTMENT_OTHER): Payer: Medicaid Other | Admitting: Oncology

## 2023-07-10 DIAGNOSIS — D509 Iron deficiency anemia, unspecified: Secondary | ICD-10-CM | POA: Diagnosis not present

## 2023-07-10 NOTE — Progress Notes (Signed)
 I connected with Haley Kramer on 07/10/23 at  3:00 PM EST by video enabled telemedicine visit and verified that I am speaking with the correct person using two identifiers.   I discussed the limitations, risks, security and privacy concerns of performing an evaluation and management service by telemedicine and the availability of in-person appointments. I also discussed with the patient that there may be a patient responsible charge related to this service. The patient expressed understanding and agreed to proceed.  Other persons participating in the visit and their role in the encounter:  none  Patient's location:  home Provider's location:  work  Stage manager Complaint: Routine follow-up of iron deficiency anemia  History of present illness: Patient is a 38 year old female with history of iron deficiency anemia requiring intermittent IV iron.  She is G3, P3 and presently [redacted] weeks pregnant  Interval history patient reports having some ongoing nausea and fatigue   Review of Systems  Constitutional:  Positive for malaise/fatigue. Negative for chills, fever and weight loss.  HENT:  Negative for congestion, ear discharge and nosebleeds.   Eyes:  Negative for blurred vision.  Respiratory:  Negative for cough, hemoptysis, sputum production, shortness of breath and wheezing.   Cardiovascular:  Negative for chest pain, palpitations, orthopnea and claudication.  Gastrointestinal:  Positive for nausea. Negative for abdominal pain, blood in stool, constipation, diarrhea, heartburn, melena and vomiting.  Genitourinary:  Negative for dysuria, flank pain, frequency, hematuria and urgency.  Musculoskeletal:  Negative for back pain, joint pain and myalgias.  Skin:  Negative for rash.  Neurological:  Negative for dizziness, tingling, focal weakness, seizures, weakness and headaches.  Endo/Heme/Allergies:  Does not bruise/bleed easily.  Psychiatric/Behavioral:  Negative for depression and suicidal ideas. The  patient does not have insomnia.     No Known Allergies  Past Medical History:  Diagnosis Date   IDA (iron deficiency anemia)    Incomplete abortion 01/14/2021   Iron deficiency anemia, unspecified iron deficiency anemia type    Obesity    PONV (postoperative nausea and vomiting)    Vitamin D deficiency     Past Surgical History:  Procedure Laterality Date   CESAREAN SECTION  03/23/2011   CHOLECYSTECTOMY  11/2022   DILATION AND EVACUATION N/A 01/22/2021   Procedure: DILATATION AND EVACUATION;  Surgeon: Natale Milch, MD;  Location: [facility] ORS;  Service: Gynecology;  Laterality: N/A;   ESOPHAGOGASTRODUODENOSCOPY  2016   gallbladder     GASTRIC BYPASS  2016   PERINEAL LACERATION REPAIR N/A 11/12/2021   Procedure: SUTURE REPAIR PERINEAL LACERATION;  Surgeon: Warden Fillers, MD;  Location: [facility];  Service: Obstetrics;  Laterality: N/A;    Social History   Socioeconomic History   Marital status: Single    Spouse name: aaron   Number of children: 1   Years of education: Not on file   Highest education level: Not on file  Occupational History   Not on file  Tobacco Use   Smoking status: Never   Smokeless tobacco: Never  Vaping Use   Vaping status: Never Used  Substance and Sexual Activity   Alcohol use: No   Drug use: Never   Sexual activity: Yes    Partners: Male    Birth control/protection: None    Comment: hx mirena x 10 yr/removed 05/11/2020  Other Topics Concern   Not on file  Social History Narrative   Lives with mother and child   Social Drivers of Health   Financial Resource Strain: Not on  file  Food Insecurity: No Food Insecurity (11/15/2021)   [facility] from [facility] prior to 07/12/2022[facility] prior to 07/12/2022[facility]   Hunger Vital Sign    Worried About Running Out of Food in the Last Year: Never true    Ran Out of Food in the Last Year: Never true   Transportation Needs: No Transportation Needs (09/16/2021)   PRAPARE - Administrator, Civil Service (Medical): No    Lack of Transportation (Non-Medical): No  Physical Activity: Not on file  Stress: Not on file  Social Connections: Not on file  Intimate Partner Violence: Not At Risk (11/15/2021)   [facility] prior to 07/12/2022., [facility] visits prior to 07/12/2022.   Humiliation, Afraid, Rape, and Kick questionnaire    Fear of Current or Ex-Partner: No    Emotionally Abused: No    Physically Abused: No    Sexually Abused: No    Family History  Problem Relation Age of Onset   Hypertension Mother    Diabetes Mother    Hypertension Father    Diabetes Father    Bipolar disorder Sister    Schizophrenia Sister    Narcolepsy Sister    Healthy Sister    Hypertension Maternal Grandmother    Diabetes Maternal Grandmother    Hypertension Maternal Grandfather    Diabetes Maternal Grandfather    Hypertension Paternal Grandmother    Hypertension Paternal Grandfather      Current Outpatient Medications:    acetaminophen (TYLENOL) 500 MG tablet, Take 2 tablets (1,000 mg total) by mouth every 6 (six) hours as needed for moderate pain, fever, headache or mild pain., Disp: 100 tablet, Rfl: 0   cyclobenzaprine (FLEXERIL) 10 MG tablet, Take 1 tablet (10 mg total) by mouth 3 (three) times daily as needed for muscle spasms., Disp: 30 tablet, Rfl: 2   doxylamine, Sleep, (UNISOM) 25 MG tablet, Take 50 mg by mouth at bedtime as needed., Disp: , Rfl:    Doxylamine-Pyridoxine (DICLEGIS) 10-10 MG TBEC, Take 2 tablets by mouth at bedtime. If symptoms persist, add one tablet in the morning and one in the afternoon, Disp: 100 tablet, Rfl: 5   ferrous gluconate (FERGON) 324 MG tablet, TAKE 1 TABLET (324 MG TOTAL) BY MOUTH EVERY OTHER DAY, Disp: 90 tablet, Rfl: 1   Prenat-FeAsp-Meth-FA-DHA w/o A (PRENATE PIXIE) 10-0.6-0.4-200 MG CAPS,  Take 1 capsule by mouth daily., Disp: 30 capsule, Rfl: 12   Prenatal Multivit-Min-Fe-FA (PRE-NATAL PO), Take by mouth., Disp: , Rfl:    promethazine (PHENERGAN) 25 MG tablet, Take 1 tablet (25 mg total) by mouth every 6 (six) hours as needed for nausea or vomiting., Disp: 30 tablet, Rfl: 1   sertraline (ZOLOFT) 50 MG tablet, TAKE 1 TABLET BY MOUTH EVERY DAY, Disp: 90 tablet, Rfl: 1   zolpidem (AMBIEN) 5 MG tablet, Take 1 tablet (5 mg total) by mouth at bedtime as needed for sleep., Disp: 30 tablet, Rfl: 1  US OB Comp Less 14 Wks Result Date: 06/23/2023 CLINICAL DATA:  Unable to find heart tones. EXAM: OBSTETRIC <14 WK ULTRASOUND TECHNIQUE: Transabdominal ultrasound was performed for evaluation of the gestation as well as the maternal uterus and adnexal regions. COMPARISON:  Multiple ultrasounds including 06/18/2023 and older. Oldest ultrasound from this pregnancy 05/21/2023. FINDINGS: Intrauterine gestational sac: Single Yolk sac:  Visualized. Embryo:  Visualized. Cardiac Activity: Visualized. Heart Rate: 166 bpm  CRL: 25.9 mm 9 w 3 d [place] EDC by prior ultrasound: 9 weeks and 0 days. Normal interval growth Subchorionic hemorrhage:  No Maternal uterus/adnexae: Left ovary not seen. Right ovary measures 3.3 x 2.5 by 2.7 cm. Small cyst seen measuring 19 mm. IMPRESSION: Single live intra pregnancy of 9 weeks and 0 days by prior ultrasound. Normal interval growth. Positive fetal heart motion Electronically Signed   By: Karen Kays M.D.   On: 06/23/2023 10:30   US OB Limited Result Date: 06/22/2023 ----------------------------------------------------------------------  OBSTETRICS REPORT                       (Signed Final 06/22/2023 10:14 am) ---------------------------------------------------------------------- Patient Info  ID #:       [id number]                          D.O.B.:  11-14-1985 (37 yrs)(F)  Name:       Haley Kramer                Visit Date: 06/18/2023 12:24 pm  ---------------------------------------------------------------------- Performed By  Attending:        Catalina Antigua MD      Ref. Address:     [address]                                                             [address]                                                             [place]                                                             [address]  Performed By:     Montez Morita RN        [facility]                                                             [facility]                                                             [place]  Referred By:      [facility] ---------------------------------------------------------------------- Orders  #  Description                           Code        Ordered By  1  US OB LIMITED                         [id number]     JESSICA EMLY ----------------------------------------------------------------------  #  Order #                     Accession #                Episode #  1  [id number]                   [id number]                 [id number] ---------------------------------------------------------------------- Indications  [redacted] weeks gestation of pregnancy                 Z3A.09 ---------------------------------------------------------------------- Fetal Evaluation  Num Of Fetuses:         1  Preg. Location:         Intrauterine  Gest. Sac:              Intrauterine  Yolk Sac:               Visualized  Fetal Pole:             Visualized  Fetal Heart Rate(bpm):  174  Cardiac Activity:       Observed ---------------------------------------------------------------------- Biometry  CRL:      20.1  mm     G. Age:  8w 3d                   EDD:   01/25/24 ---------------------------------------------------------------------- OB History  Gravidity:    5         Term:   2        Prem:   0        SAB:   2  TOP:          0        Ectopic:  0        Living: 2 ---------------------------------------------------------------------- Gestational Age  LMP:           9w 0d         Date:  04/16/23                  EDD:   01/21/24  Best:          Grace Isaac 0d      Det. By:  LMP  (04/16/23)          EDD:   01/21/24 ---------------------------------------------------------------------- Comments  OB limited US for fetal heart rate. Provider unable to get  FHTs with hand held doppler. Cardiac activity observed. EDD  previously established. Fetal appearance consistent with  expected GA. ---------------------------------------------------------------------- Impression  [place] intrauterine pregnancy ----------------------------------------------------------------------  Recommendations  Routine prenatal care ----------------------------------------------------------------------                Catalina Antigua, MD Electronically Signed Final Report   06/22/2023 10:14 am ----------------------------------------------------------------------    No images are attached to the encounter.      Latest Ref Rng & Units 06/18/2023   10:57 AM  CMP  Glucose 70 - 99 mg/dL 76   BUN 6 - 20 mg/dL 6   Creatinine 1.61 - 0.96 mg/dL 0.45   Sodium 409 - 811 mmol/L 138   Potassium 3.5 - 5.2 mmol/L 3.7   Chloride 96 - 106 mmol/L 105   CO2 20 - 29 mmol/L 20   Calcium 8.7 - 10.2 mg/dL 8.8   Total Protein 6.0 - 8.5 g/dL 6.3   Total Bilirubin 0.0 - 1.2 mg/dL 0.4   Alkaline Phos 44 - 121 IU/L 94   AST 0 - 40 IU/L 60   ALT 0 - 32 IU/L 66       Latest Ref Rng & Units 07/07/2023    9:20 AM  CBC  WBC 4.0 - 10.5 K/uL 8.4   Hemoglobin 12.0 - 15.0 g/dL 91.4   Hematocrit 78.2 - 46.0 % 37.2   Platelets 150 - 400 K/uL 181      Observation/objective: Appears in no acute distress over video visit today.  Breathing is nonlabored  Assessment and plan: Patient is a 38 year old female and this is a routine follow-up visit for iron deficiency anemia  Patient is currently in her  first trimester of pregnancy.  She is not presently anemic and iron studies are within normal limits.  I will see her back in 4 months with CBC ferritin and iron studies and if there is evidence of significant anemia we will consider giving her IV iron  Follow-up instructions: As above  I discussed the assessment and treatment plan with the patient. The patient was provided an opportunity to ask questions and all were answered. The patient agreed with the plan and demonstrated an understanding of the instructions.   The patient was advised to call back or seek an in-person evaluation if the symptoms worsen or if the condition fails to improve as anticipated.  I provided 9 minutes of face-to-face video visit time during this encounter, and > 50% was spent counseling as documented under my assessment & plan.  Visit Diagnosis: 1. Iron deficiency anemia, unspecified iron deficiency anemia type     Dr. Owens Shark, MD, MPH [facility] at [facility] Tel- [phone] 07/10/2023 3:17 PM

## 2023-07-15 ENCOUNTER — Other Ambulatory Visit

## 2023-07-15 DIAGNOSIS — Z3482 Encounter for supervision of other normal pregnancy, second trimester: Secondary | ICD-10-CM

## 2023-07-21 LAB — PANORAMA PRENATAL TEST FULL PANEL:PANORAMA TEST PLUS 5 ADDITIONAL MICRODELETIONS: FETAL FRACTION: 6.2

## 2023-07-27 LAB — HORIZON CUSTOM: REPORT SUMMARY: POSITIVE — AB

## 2023-07-28 ENCOUNTER — Other Ambulatory Visit: Payer: Self-pay | Admitting: *Deleted

## 2023-07-28 DIAGNOSIS — R7989 Other specified abnormal findings of blood chemistry: Secondary | ICD-10-CM

## 2023-07-28 DIAGNOSIS — Z98891 History of uterine scar from previous surgery: Secondary | ICD-10-CM

## 2023-07-28 DIAGNOSIS — O0990 Supervision of high risk pregnancy, unspecified, unspecified trimester: Secondary | ICD-10-CM

## 2023-07-28 DIAGNOSIS — O09299 Supervision of pregnancy with other poor reproductive or obstetric history, unspecified trimester: Secondary | ICD-10-CM

## 2023-07-28 NOTE — Progress Notes (Signed)
 Notified. See note with MFM genetic counseling referral.

## 2023-07-28 NOTE — Progress Notes (Signed)
 Pt RTC regarding [employer] results. 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 She would still like the referral to the MFM genetic counselors. Pt concerned that more antenatal surveillance and USs are not being done in this pregnancy. General antenatal testing and guidelines discussed. Reviewed reasons early USs  are typically done and the GA at which a baby can survive following delivery. Reviewed the role of MFM in surveillance and guiding care. Pt encouraged to ask any remaining questions at upcoming prenatal visit. Pt verbalized understanding.

## 2023-07-29 DIAGNOSIS — O09299 Supervision of pregnancy with other poor reproductive or obstetric history, unspecified trimester: Secondary | ICD-10-CM | POA: Insufficient documentation

## 2023-07-29 NOTE — Progress Notes (Unsigned)
   PRENATAL VISIT NOTE  Subjective:  Haley Kramer is a 38 y.o. L2G4010 at [redacted]w[redacted]d being seen today for ongoing prenatal care.  She is currently monitored for the following issues for this {Blank single:19197::"high-risk","low-risk"} pregnancy and has Benign hypertension; Depression; Anemia in pregnancy; AMA (advanced maternal age) multigravida 35+; History of C-section; Rh negative status during pregnancy; Achondroplasia syndrome; History of bariatric surgery; History of delivery by vacuum extraction, currently pregnant; History of VBAC; Supervision of high risk pregnancy, antepartum; and Elevated liver function tests on their problem list.  Patient reports {sx:14538}.   .  .   . Denies leaking of fluid.   The following portions of the patient's history were reviewed and updated as appropriate: allergies, current medications, past family history, past medical history, past social history, past surgical history and problem list.   Objective:  There were no vitals filed for this visit.  Fetal Status:           General:  Alert, oriented and cooperative. Patient is in no acute distress.  Skin: Skin is warm and dry. No rash noted.   Cardiovascular: Normal heart rate noted  Respiratory: Normal respiratory effort, no problems with respiration noted  Abdomen: Soft, gravid, appropriate for gestational age.         Assessment and Plan:  Pregnancy: [id number] at [redacted]w[redacted]d 1. Supervision of high risk pregnancy, antepartum (Primary) 2. [redacted] weeks gestation of pregnancy Discussed AFP***  3. History of VBAC 4. History of delivery by vacuum extraction, currently pregnant 5. History of C-section G1 (2012) pLTCS for *** G3 (2023) VBAC, VAVD for fetal bradycardia at +3 station, infant weight 2600g  6. History of bariatric surgery ***type?? Micronutrient labs?  7. Elevated liver function tests AST 60, ASLT 66 on baseline preeclampsia labs Repeat today***  8. Rh negative status during pregnancy in third  trimester Rhogam at 28w  10. Benign hypertension *** ldASA pending bypass type  11. Other depression ***On zoloft?  12. 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 Multigravida of advanced maternal age in third trimester   {Routine Plan:28585}  {Blank single:19197::"Term","Preterm"} labor symptoms and general obstetric precautions including but not limited to vaginal bleeding, contractions, leaking of fluid and fetal movement were reviewed in detail with the patient. Please refer to After Visit Summary for other counseling recommendations.   No follow-ups on file.  Future Appointments  Date Time Provider Department Center  07/30/2023  8:35 AM Lennart Pall, MD [facility] None  09/01/2023  9:15 AM [facility] NURSE [facility]  09/01/2023  9:30 AM [facility] US2 [facility]  11/09/2023  3:00 PM [facility] None  11/09/2023  3:15 PM Creig Hines, MD [facility] None    Lennart Pall, MD

## 2023-07-30 ENCOUNTER — Encounter: Payer: Self-pay | Admitting: Obstetrics and Gynecology

## 2023-07-30 ENCOUNTER — Ambulatory Visit (INDEPENDENT_AMBULATORY_CARE_PROVIDER_SITE_OTHER): Payer: Medicaid Other | Admitting: Obstetrics and Gynecology

## 2023-07-30 VITALS — BP 105/64 | HR 78 | Wt 208.0 lb

## 2023-07-30 DIAGNOSIS — O0990 Supervision of high risk pregnancy, unspecified, unspecified trimester: Secondary | ICD-10-CM

## 2023-07-30 DIAGNOSIS — O26893 Other specified pregnancy related conditions, third trimester: Secondary | ICD-10-CM

## 2023-07-30 DIAGNOSIS — O09299 Supervision of pregnancy with other poor reproductive or obstetric history, unspecified trimester: Secondary | ICD-10-CM

## 2023-07-30 DIAGNOSIS — F3289 Other specified depressive episodes: Secondary | ICD-10-CM

## 2023-07-30 DIAGNOSIS — Z3A15 15 weeks gestation of pregnancy: Secondary | ICD-10-CM | POA: Diagnosis not present

## 2023-07-30 DIAGNOSIS — Z98891 History of uterine scar from previous surgery: Secondary | ICD-10-CM | POA: Diagnosis not present

## 2023-07-30 DIAGNOSIS — Z6791 Unspecified blood type, Rh negative: Secondary | ICD-10-CM

## 2023-07-30 DIAGNOSIS — O09523 Supervision of elderly multigravida, third trimester: Secondary | ICD-10-CM

## 2023-07-30 DIAGNOSIS — I10 Essential (primary) hypertension: Secondary | ICD-10-CM

## 2023-07-30 DIAGNOSIS — R7989 Other specified abnormal findings of blood chemistry: Secondary | ICD-10-CM

## 2023-07-30 DIAGNOSIS — Z9884 Bariatric surgery status: Secondary | ICD-10-CM

## 2023-07-30 MED ORDER — ESCITALOPRAM OXALATE 10 MG PO TABS: ORAL_TABLET | ORAL | 11 refills | Status: DC

## 2023-07-30 MED ORDER — HYDROXYZINE PAMOATE 25 MG PO CAPS: 25.0000 mg | ORAL_CAPSULE | Freq: Four times a day (QID) | ORAL | 2 refills | Status: DC | PRN

## 2023-07-30 NOTE — Progress Notes (Signed)
 Pt would like to discuss vitamin, what she can take. Pt also complains of panic attacks/anxiety - pt has not had this problem in past.  Pt states she feels she may have an umbilical hernia.    GAD score 19 PHQ9 score 18 Pt would like to reschedule appt with counselor

## 2023-07-30 NOTE — Patient Instructions (Addendum)
 It was nice meeting you today! You will see your results in the MyChart app within 1 week.  Kirke Corin is a virtual mental health platform available to our patients  You can refer yourself to this online platform using the link below  [email or web address]  [facility]  [address] [place], [place] [address] [phone] or [phone]  [facility] 24/7 FOR ANYONE  [address], [place], [place]  [phone]  Fax: [phone] [email or web address]  *Interpreters available  *Accepts all insurance and uninsured for Urgent Care needs  *Accepts Medicaid and uninsured for outpatient treatment (below)    ONLY FOR [facility]  Below:   Outpatient New Patient Assessment/Therapy Walk-ins:        Monday -Thursday 8am until slots are full.        Every Friday 1pm-4pm  (first come, first served)                    New Patient Psychiatry/Medication Management         Monday-Friday 8am-11am (first come, first served)              For all walk-ins we ask that you arrive by 7:15am, because patients will be seen in the order of arrival.

## 2023-07-31 ENCOUNTER — Encounter: Payer: Self-pay | Admitting: Obstetrics and Gynecology

## 2023-07-31 LAB — COMPREHENSIVE METABOLIC PANEL
ALT: 48 IU/L — ABNORMAL HIGH (ref 0–32)
AST: 39 IU/L (ref 0–40)
Albumin: 4 g/dL (ref 3.9–4.9)
Alkaline Phosphatase: 131 IU/L — ABNORMAL HIGH (ref 44–121)
BUN/Creatinine Ratio: 16 (ref 9–23)
BUN: 7 mg/dL (ref 6–20)
Bilirubin Total: 0.5 mg/dL (ref 0.0–1.2)
CO2: 18 mmol/L — ABNORMAL LOW (ref 20–29)
Calcium: 8.8 mg/dL (ref 8.7–10.2)
Chloride: 104 mmol/L (ref 96–106)
Creatinine, Ser: 0.43 mg/dL — ABNORMAL LOW (ref 0.57–1.00)
Globulin, Total: 2.1 g/dL (ref 1.5–4.5)
Glucose: 73 mg/dL (ref 70–99)
Potassium: 3.7 mmol/L (ref 3.5–5.2)
Sodium: 135 mmol/L (ref 134–144)
Total Protein: 6.1 g/dL (ref 6.0–8.5)
eGFR: 128 mL/min/{1.73_m2} (ref >59)

## 2023-07-31 LAB — FOLATE: Folate: 6 ng/mL (ref >3.0)

## 2023-07-31 LAB — FERRITIN: Ferritin: 135 ng/mL (ref 15–150)

## 2023-07-31 LAB — VITAMIN B12: Vitamin B-12: 369 pg/mL (ref 232–1245)

## 2023-07-31 LAB — VITAMIN D 25 HYDROXY (VIT D DEFICIENCY, FRACTURES): Vit D, 25-Hydroxy: 6.3 ng/mL — ABNORMAL LOW (ref 30.0–100.0)

## 2023-08-21 ENCOUNTER — Other Ambulatory Visit: Payer: Self-pay | Admitting: Obstetrics and Gynecology

## 2023-08-26 NOTE — Progress Notes (Unsigned)
   PRENATAL VISIT NOTE  Subjective:  Haley Kramer is a 38 y.o. H0Q6578 at [redacted]w[redacted]d being seen today for ongoing prenatal care.  She is currently monitored for the following issues for this {Blank single:19197::"high-risk","low-risk"} pregnancy and has Benign hypertension; Depression; Anemia in pregnancy; Vitamin D deficiency; AMA (advanced maternal age) multigravida 35+; History of C-section; Rh negative status during pregnancy; History of bariatric surgery; History of delivery by vacuum extraction, currently pregnant; Supervision of high risk pregnancy, antepartum; Elevated liver function tests; and History of fetal abnormality in previous pregnancy, currently pregnant on their problem list.  Patient reports {sx:14538}.   .  .   . Denies leaking of fluid.   The following portions of the patient's history were reviewed and updated as appropriate: allergies, current medications, past family history, past medical history, past social history, past surgical history and problem list.   Objective:  There were no vitals filed for this visit.  Fetal Status:           General:  Alert, oriented and cooperative. Patient is in no acute distress.  Skin: Skin is warm and dry. No rash noted.   Cardiovascular: Normal heart rate noted  Respiratory: Normal respiratory effort, no problems with respiration noted  Abdomen: Soft, gravid, appropriate for gestational age.        Pelvic: {Blank single:19197::"Cervical exam performed in the presence of a chaperone","Cervical exam deferred"}        Extremities: Normal range of motion.     Mental Status: Normal mood and affect. Normal behavior. Normal judgment and thought content.   Assessment and Plan:  Pregnancy: [id number] at [redacted]w[redacted]d 1. Supervision of high risk pregnancy, antepartum (Primary) Patient is doing well, feeling regular fetal movement BP, FHR, FH appropriate   2. [redacted] weeks gestation of pregnancy Anticipatory guidance about next visits/weeks of pregnancy  given.  Anatomy scan 09/01/23  3. Chronic hypertension Normotensive, no current meds Delivery at 39 weeks Ssxs preE reviewed  Continue ASA  {Blank single:19197::"Term","Preterm"} labor symptoms and general obstetric precautions including but not limited to vaginal bleeding, contractions, leaking of fluid and fetal movement were reviewed in detail with the patient. Please refer to After Visit Summary for other counseling recommendations.   No follow-ups on file.  Future Appointments  Date Time Provider Department Center  08/27/2023  8:00 AM [facility] NURSE INTAKE [facility]  08/27/2023  8:35 AM Leaman Abe E, PA-C [facility] None  09/01/2023  9:00 AM [facility] PROVIDER 1 [facility]  09/01/2023  9:30 AM [facility] US2 [facility]  11/09/2023  3:00 PM [facility] None  11/09/2023  3:15 PM Avonne Boettcher, MD [facility] None    Luevenia Saha, PA-C

## 2023-08-27 ENCOUNTER — Ambulatory Visit: Attending: Obstetrics and Gynecology

## 2023-08-27 ENCOUNTER — Ambulatory Visit: Admitting: Physician Assistant

## 2023-08-27 VITALS — BP 114/67 | HR 78 | Wt 215.0 lb

## 2023-08-27 DIAGNOSIS — I10 Essential (primary) hypertension: Secondary | ICD-10-CM

## 2023-08-27 DIAGNOSIS — Z3A19 19 weeks gestation of pregnancy: Secondary | ICD-10-CM | POA: Diagnosis not present

## 2023-08-27 DIAGNOSIS — O09523 Supervision of elderly multigravida, third trimester: Secondary | ICD-10-CM

## 2023-08-27 DIAGNOSIS — O0990 Supervision of high risk pregnancy, unspecified, unspecified trimester: Secondary | ICD-10-CM

## 2023-08-27 DIAGNOSIS — O09299 Supervision of pregnancy with other poor reproductive or obstetric history, unspecified trimester: Secondary | ICD-10-CM

## 2023-08-27 NOTE — Progress Notes (Signed)
 Left side swelling and uncomfortable. Thinks from wt loss surgery but wants checked.

## 2023-08-27 NOTE — Progress Notes (Signed)
 New OB Intake  I connected with [name]  on 08/27/23 at 10:25 AM by phone and verified that I am speaking with the correct person using two identifiers. Nurse is located at [facility] and pt is located in her vehicle.   I explained I am completing New Patient Intake today. We discussed EDD of 01/21/2024, by Last Menstrual Period. Pt is [id number]. I reviewed her allergies, medications and Medical/Surgical/OB history.    Problem List There are no diagnoses linked to this encounter.  OB History  Gravida Para Term Preterm AB Living  5 2 2  0 2 2  SAB IAB Ectopic Multiple Live Births  2 0 0 0 2    # Outcome Date GA Lbr Len/2nd Weight Sex Type Anes PTL Lv  5 Current           4 SAB 12/2022     SAB     3 Term 11/12/21 106w1d 06:19 / 00:56 5 lb 11.7 oz (2.6 kg) M VBAC, Vacuum EPI  LIV  2 SAB 01/2021          1 Term 03/23/11   6 lb 13 oz (3.09 kg) F CS-LTranv   LIV     Past Medical History:  Diagnosis Date   IDA (iron deficiency anemia)    Incomplete abortion 01/14/2021   Iron deficiency anemia, unspecified iron deficiency anemia type    Obesity    PONV (postoperative nausea and vomiting)    Vitamin D deficiency        Past Surgical History:  Procedure Laterality Date   CESAREAN SECTION  03/23/2011   CHOLECYSTECTOMY  11/2022   DILATION AND EVACUATION N/A 01/22/2021   Procedure: DILATATION AND EVACUATION;  Surgeon: Natale Milch, MD;  Location: [facility] ORS;  Service: Gynecology;  Laterality: N/A;   ESOPHAGOGASTRODUODENOSCOPY  2016   gallbladder     GASTRIC BYPASS  2016   PERINEAL LACERATION REPAIR N/A 11/12/2021   Procedure: SUTURE REPAIR PERINEAL LACERATION;  Surgeon: Warden Fillers, MD;  Location: [facility];  Service: Obstetrics;  Laterality: N/A;     Current Outpatient Medications on File Prior to Visit  Medication Sig Dispense Refill   acetaminophen (TYLENOL) 500 MG tablet Take 2 tablets (1,000 mg total) by mouth every 6 (six) hours as needed for  moderate pain, fever, headache or mild pain. 100 tablet 0   doxylamine, Sleep, (UNISOM) 25 MG tablet Take 50 mg by mouth at bedtime as needed.     Doxylamine-Pyridoxine (DICLEGIS) 10-10 MG TBEC Take 2 tablets by mouth at bedtime. If symptoms persist, add one tablet in the morning and one in the afternoon 100 tablet 5   escitalopram (LEXAPRO) 10 MG tablet TAKE 0.5 TABLETS (5 MG TOTAL) BY MOUTH DAILY FOR 8 DAYS, THEN 1 TABLET (10 MG TOTAL) DAILY. 78 tablet 5   ferrous gluconate (FERGON) 324 MG tablet TAKE 1 TABLET (324 MG TOTAL) BY MOUTH EVERY OTHER DAY (Patient not taking: Reported on 08/27/2023) 90 tablet 1   hydrOXYzine (VISTARIL) 25 MG capsule Take 1 capsule (25 mg total) by mouth every 6 (six) hours as needed for anxiety. 30 capsule 2   Prenat-FeAsp-Meth-FA-DHA w/o A (PRENATE PIXIE) 10-0.6-0.4-200 MG CAPS Take 1 capsule by mouth daily. 30 capsule 12   Prenatal Multivit-Min-Fe-FA (PRE-NATAL PO) Take by mouth. (Patient not taking: Reported on 08/27/2023)     promethazine (PHENERGAN) 25 MG tablet Take 1 tablet (25 mg total) by mouth every 6 (six) hours as needed for nausea  or vomiting. 30 tablet 1   No current facility-administered medications on file prior to visit.      No Known Allergies   Patient advised of first appointment in our office and when to arrive.   All questions were answered.

## 2023-08-29 LAB — AFP, SERUM, OPEN SPINA BIFIDA
AFP MoM: 1.29
AFP Value: 56.2 ng/mL
Gest. Age on Collection Date: 19 wk
Maternal Age At EDD: 38.5 a
OSBR Risk 1 IN: 9894
Test Results:: NEGATIVE
Weight: 215 [lb_av]

## 2023-09-01 ENCOUNTER — Ambulatory Visit: Payer: Medicaid Other

## 2023-09-01 ENCOUNTER — Ambulatory Visit: Admitting: Obstetrics and Gynecology

## 2023-09-01 ENCOUNTER — Ambulatory Visit: Payer: Medicaid Other | Attending: Obstetrics and Gynecology

## 2023-09-01 ENCOUNTER — Other Ambulatory Visit: Payer: Self-pay | Admitting: *Deleted

## 2023-09-01 VITALS — BP 106/55 | HR 81

## 2023-09-01 DIAGNOSIS — O09299 Supervision of pregnancy with other poor reproductive or obstetric history, unspecified trimester: Secondary | ICD-10-CM

## 2023-09-01 DIAGNOSIS — R7989 Other specified abnormal findings of blood chemistry: Secondary | ICD-10-CM | POA: Diagnosis present

## 2023-09-01 DIAGNOSIS — O99842 Bariatric surgery status complicating pregnancy, second trimester: Secondary | ICD-10-CM | POA: Diagnosis not present

## 2023-09-01 DIAGNOSIS — O0990 Supervision of high risk pregnancy, unspecified, unspecified trimester: Secondary | ICD-10-CM | POA: Diagnosis present

## 2023-09-01 DIAGNOSIS — O09522 Supervision of elderly multigravida, second trimester: Secondary | ICD-10-CM

## 2023-09-01 DIAGNOSIS — O10912 Unspecified pre-existing hypertension complicating pregnancy, second trimester: Secondary | ICD-10-CM

## 2023-09-01 DIAGNOSIS — O34219 Maternal care for unspecified type scar from previous cesarean delivery: Secondary | ICD-10-CM | POA: Diagnosis present

## 2023-09-01 DIAGNOSIS — Z3A19 19 weeks gestation of pregnancy: Secondary | ICD-10-CM | POA: Diagnosis present

## 2023-09-01 DIAGNOSIS — O3501X0 Maternal care for (suspected) central nervous system malformation or damage in fetus, agenesis of the corpus callosum, not applicable or unspecified: Secondary | ICD-10-CM | POA: Diagnosis not present

## 2023-09-01 DIAGNOSIS — O289 Unspecified abnormal findings on antenatal screening of mother: Secondary | ICD-10-CM | POA: Diagnosis present

## 2023-09-01 DIAGNOSIS — O99212 Obesity complicating pregnancy, second trimester: Secondary | ICD-10-CM

## 2023-09-01 DIAGNOSIS — E669 Obesity, unspecified: Secondary | ICD-10-CM

## 2023-09-01 NOTE — Progress Notes (Signed)
 Maternal-Fetal Medicine Consultation Name: Synai Prettyman MRN: [id number]  G5 G9562 at 19w 5d gestation. Advanced maternal age.  On cell-free fetal DNA screening, the risks of fetal aneuploidies are not increased.  MSAFP screening showed low risk for open neural tube defects.  Obstetric history significant for a term cesarean delivery in 2012 of a female infant weighing 6 pounds and 13 ounces at birth.  Her daughter is in good health. 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  Subsequently, patient had amniocentesis and FGFR3 mutation was negative.  Karyotype and microarray did not show abnormalities.  At [redacted] weeks gestation, bilateral ventriculomegaly (15 mm) were seen.  Severe polyhydramnios (40 cm) was seen.  Patient underwent induction of labor at [redacted] weeks gestation and delivered a female infant weighing 5 pounds 11 ounces at birth.  Infant developed respiratory distress and was later transferred to [facility].  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  Whole exome sequence (WES) did not show inherited X-linked disorder leading to hydrocephalus (L1 syndrome).  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  Patient has a diagnosis of chronic hypertension that is reportedly well-controlled without antihypertensives.  Blood pressure today at our office is 106/55 mmHg. 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  Ultrasound We performed a fetal anatomical survey. Amniotic fluid is normal and good fetal activity seen. Fetal biometry is consistent with the previously established dates. The  following findings are seen: - The CSP is absent. - Bilateral ventriculomegaly are seen. The lateral ventricles measure 11 millimeters each. Anterior horn dilations are seen with possible confluence. - On coronal and sagittal views, I could not identify corpus callosum. Pericallosal artery is not complete on color Doppler flow. - Posterior fossa and midline appear normal. Cardiac anatomy appears normal. No other obvious fetal structural defects are seen. Fetal sex is female.  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 Absent CSP is one of the signs of partial or complete agenesis of corpus callosum. It is difficult to diagnose partial agenesis in many cases.  She needs follow-up ultrasound to confirm our findings.  In isolated absent CSP, the prognosis is good in more than 80% of the cases. In about 20% of cases, it can be associated with septo-optic dysplasia and prenatal diagnosis is challenging both by ultrasound and MRI.  I counseled the patient on agenesis of corpus callosum (ACC).  ACC associated with an increased risk of fetal chromosomal anomalies (up to 20%) and some genetic syndromes.  About 80% will have additional brain anomalies (not seen on today's ultrasound) and many with extracranial anomalies (fetal anatomy appears normal). Isolated ACC have good neurodevelopmental outcomes (70% of cases).  I recommended amniocentesis for fetal karyotype and microarray. Whole genome sequencing can be performed to identify genetic conditions. I explained amniocentesis procedure and possible complication of miscarriage (1 and 500 procedures). MRI is likely to give additional information on structural brain anomalies. It is best deferred to 26 to [redacted] weeks gestation. If patient needs confirmation to decide on termination of pregnancy, we can set up an  appointment for MRI now.   Patient would like to return  tomorrow for amniocentesis.  I discussed other causes of ventriculomegaly including perinatal infections (cytomegalovirus and toxoplasmosis).  If patient opts for amniocentesis, we will order PCR for CMV and toxoplasmosis.   Patient informed that she is considering termination of pregnancy given that she has a child that requires constant medical attention.  Recommendations -Amniocentesis tomorrow. -Amniotic fluid to be sent for AFP AFP, karyotype, microarray and whole genome sequence (if patient prefers). -Fluid to be sent for toxoplasmosis and CMV PCR. Consultation including face-to-face (more than 50%) counseling 45 minutes.

## 2023-09-02 ENCOUNTER — Other Ambulatory Visit: Payer: Self-pay | Admitting: Obstetrics and Gynecology

## 2023-09-02 ENCOUNTER — Ambulatory Visit

## 2023-09-02 ENCOUNTER — Ambulatory Visit (HOSPITAL_BASED_OUTPATIENT_CLINIC_OR_DEPARTMENT_OTHER): Admitting: Obstetrics and Gynecology

## 2023-09-02 ENCOUNTER — Ambulatory Visit: Attending: Obstetrics and Gynecology

## 2023-09-02 VITALS — BP 110/60

## 2023-09-02 DIAGNOSIS — O99842 Bariatric surgery status complicating pregnancy, second trimester: Secondary | ICD-10-CM | POA: Insufficient documentation

## 2023-09-02 DIAGNOSIS — O0990 Supervision of high risk pregnancy, unspecified, unspecified trimester: Secondary | ICD-10-CM

## 2023-09-02 DIAGNOSIS — O09522 Supervision of elderly multigravida, second trimester: Secondary | ICD-10-CM

## 2023-09-02 DIAGNOSIS — O3506X0 Maternal care for (suspected) central nervous system malformation or damage in fetus, hydrocephaly, not applicable or unspecified: Secondary | ICD-10-CM

## 2023-09-02 DIAGNOSIS — R7989 Other specified abnormal findings of blood chemistry: Secondary | ICD-10-CM

## 2023-09-02 DIAGNOSIS — Z3A19 19 weeks gestation of pregnancy: Secondary | ICD-10-CM | POA: Diagnosis not present

## 2023-09-02 DIAGNOSIS — O99212 Obesity complicating pregnancy, second trimester: Secondary | ICD-10-CM | POA: Insufficient documentation

## 2023-09-02 DIAGNOSIS — O10012 Pre-existing essential hypertension complicating pregnancy, second trimester: Secondary | ICD-10-CM

## 2023-09-02 DIAGNOSIS — O3501X0 Maternal care for (suspected) central nervous system malformation or damage in fetus, agenesis of the corpus callosum, not applicable or unspecified: Secondary | ICD-10-CM | POA: Diagnosis not present

## 2023-09-02 DIAGNOSIS — O10112 Pre-existing hypertensive heart disease complicating pregnancy, second trimester: Secondary | ICD-10-CM | POA: Insufficient documentation

## 2023-09-02 DIAGNOSIS — O360120 Maternal care for anti-D [Rh] antibodies, second trimester, not applicable or unspecified: Secondary | ICD-10-CM

## 2023-09-02 DIAGNOSIS — O283 Abnormal ultrasonic finding on antenatal screening of mother: Secondary | ICD-10-CM

## 2023-09-02 DIAGNOSIS — O34219 Maternal care for unspecified type scar from previous cesarean delivery: Secondary | ICD-10-CM | POA: Insufficient documentation

## 2023-09-02 DIAGNOSIS — O09292 Supervision of pregnancy with other poor reproductive or obstetric history, second trimester: Secondary | ICD-10-CM | POA: Insufficient documentation

## 2023-09-02 DIAGNOSIS — Z8279 Family history of other congenital malformations, deformations and chromosomal abnormalities: Secondary | ICD-10-CM | POA: Diagnosis not present

## 2023-09-02 DIAGNOSIS — Z148 Genetic carrier of other disease: Secondary | ICD-10-CM | POA: Diagnosis not present

## 2023-09-02 DIAGNOSIS — O10912 Unspecified pre-existing hypertension complicating pregnancy, second trimester: Secondary | ICD-10-CM

## 2023-09-02 DIAGNOSIS — O358XX0 Maternal care for other (suspected) fetal abnormality and damage, not applicable or unspecified: Secondary | ICD-10-CM | POA: Diagnosis not present

## 2023-09-02 DIAGNOSIS — O09299 Supervision of pregnancy with other poor reproductive or obstetric history, unspecified trimester: Secondary | ICD-10-CM

## 2023-09-02 DIAGNOSIS — O3501X1 Maternal care for (suspected) central nervous system malformation or damage in fetus, agenesis of the corpus callosum, fetus 1: Secondary | ICD-10-CM

## 2023-09-02 MED ORDER — RHO D IMMUNE GLOBULIN 1500 UNIT/2ML IJ SOSY
300.0000 ug | PREFILLED_SYRINGE | Freq: Once | INTRAMUSCULAR | Status: AC
  Administered 2023-09-02: 300 ug via INTRAMUSCULAR

## 2023-09-02 NOTE — Progress Notes (Signed)
  Maternal-Fetal Medicine Consultation Name: Haley Kramer MRN: [id number]  G5 P2002 at 19w 6d gestation. Patient returned for amniocentesis. 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 The couple met with our genetic counselor today (see separate letter).  Ultrasound A limited ultrasound study was performed. Amniotic fluid is normal and good fetal activity is seen. Bilateral mild ventriculomegaly (11 millimeters each) are seen. On today's ultrasound, I felt that the CSP could be present (thinned out). Anterior horns are dilated.  I counseled the patient that the possibility of agenesis (partial or complete) of corpus callosum is still entertained.   I discussed MRI. Patient may consider termination and requests an early MRI. She is aware that MRI at early gestational ages may not provide full information because of restriction by fetal movements. Even if ACC is ruled out, ventriculomegaly is an indication for amniocentesis.  I explained amniocentesis procedure and possible complication of miscarriage (1 in 500 procedures). Patient opted to have amniocentesis.  After informed consent, amniocentesis was performed by Dr. Arnie Bibber under ultrasound guidance and initial 3 milliliters of clear amniotic fluid withdrawn was discarded to prevent maternal cell contamination. Additional 53 milliliters of clear amniotic fluid was withdrawn and equally split into 2 sets of tubes. Fluid was sent for fetal AFAFP, karyotype, PCR for CMV and toxoplasmosis ([facility]), and microarray analysis and whole genome sequence ([employer]). Patient tolerated the procedure well. Post-procedure fetal heart rate was normal. We gave her post-procedure instruction.  Patient's blood type is O negative. She received rhogam at our office.  Recommendations -We will communicate the results to the patient. -We have requested fetal brain MRI [facility]). -Follow-up appointment on 09/23/23.  Consultation including face-to-face (more than 50%) counseling 20 minutes.

## 2023-09-02 NOTE — Progress Notes (Signed)
 [facility] for Maternal Fetal Care at [facility] [address] Phone:  [phone]   Fax:  [phone]      In-Person Genetic Counseling Clinic Note:   I spoke with 38 y.o. 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 She was referred by [facility]. She was accompanied by her husband Haley Kramer.   Pregnancy History:    [id number]. EGA: [redacted]w[redacted]d by LMP. EDD: 01/21/2024. This is Haley Kramer's fifth pregnancy. She has a healthy 69 yo daughter and a 63 mo son. Please see his history below. Haley Kramer reported two miscarriages. 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 Chromosome anomalies account for a majority of first trimester miscarriages, with trisomy 16 being the most common. Recurrence risk is estimated to be 1% or the age-related risk, whichever is greater. Medications were reviewed with the nurse. Denies personal history of diabetes, high blood pressure, thyroid conditions, and seizures. Denies bleeding, infections, and fevers in this pregnancy. Denies using tobacco, alcohol, or street drugs in this pregnancy.   Family History:    A three-generation pedigree was drawn by pediatric genetic counselor Haley Kast, MS, CGC at [facility]. The couple reports no recent birth or deaths and no genetic or major health diagnoses since the pedigree was elicited. A copy of this pedigree that was redrawn during this session was scanned into the patient's chart.  The couple's 21 mo son Haley Kramer had genetic testing due to a suspected genetic condition, likely skeletal dysplasia. The anatomy ultrasound was normal; however, a follow-up ultrasound at 30w gestation showed mild frontal bossing, short long bones, trident-shaped hands, mild bell shaped chest, and polyhydramnios. Skeletal dysplasia was suspected, and chromosome analysis and a skeletal dysplasia panel were ordered on an amniotic  fluid sample. Those all returned as negative. Please see those reports for details.  Follow-up ultrasound at 36w gestation showed bilateral ventriculomegaly as well. Haley Kramer was seen at [facility], where skeletal dysplasia was also suspected. 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 This was not sent out as a trio test, so it is unknown if this variant was inherited or arose de novo. Per the pediatric genetics team at [facility], this VUS does not confer a clear diagnosis. Otherwise genetic testing has been negative. Please see those reports for details. The couple reports Haley Kramer is doing very well overall. He does not have hearing loss, which was initially suspected. He does not appear to have intellectual disability. 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 Since no genetic diagnosis was found for Haley Kramer, recurrence risk is difficult to estimate. Moreover, the current fetus's ACC and ventriculomegaly findings may or may not be unrelated to Haley Kramer's prenatal findings.  Haley Kramer reports her two nephews have autism, no health concerns or known genetic testing. Recurrence risk is difficult to estimate without known medical or genetic test reports, since autism can be multifactorial or part of a genetic condition. Haley Kramer had negative fragile X carrier screening.  Maternal ethnicity reported as Black and paternal ethnicity reported as Black. Denies Ashkenazi Jewish ancestry.  Family history not remarkable for consanguinity, individuals with  birth defects,  intellectual disability, autism spectrum disorder, multiple spontaneous abortions, still births, or unexplained neonatal death.   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 The anatomy ultrasound was otherwise normal. Please see ultrasound report for details.   We reviewed agenesis of the corpus callosum (ACC). This is one of several disorders of the corpus callosum, the structure that connects the two cerebral hemispheres of the brain. In [facility], the corpus callosum is partially or completely absent. It can occur as an isolated condition or with other cerebral abnormalities including Arnold-Chiari Malformation, Dandy Walker Malformation, schizencephaly, and holoprosencephaly. The effects of the disorder range from mild to severe, depending on associated brain abnormalities as well as if an underlying genetic condition is present. A fetal MRI may be useful to delineate the CNS anatomy. ACC can also be associated with malformations in other parts of the body. When ACC is visualized on ultrasound, fetal echocardiography should be performed to detect congenital heart disease. It is estimated that 65% of isolated ACC cases result in typical outcomes and development. Because 20% of ACC cases are associated with chromosomal abnormalities and other genetic syndromes, genetic testing to determine whether a case is isolated or syndromic is indicated. Chromosomal conditions that cause ACC include aneuploidies, such as trisomy 35 and trisomy 39, and chromosomal deletions and duplications, such as 4p deletion syndrome. ACC can also occur as part of single gene conditions, such as tuberous sclerosis, Apert syndrome, and Joubert syndrome.  We reviewed that diagnostic testing is the only way to definitively test for genetic conditions prenatally. We reviewed genes, chromosomes, and  inheritance patterns. We discussed available prenatal diagnostic testing options from amniocentesis including chromosomal analysis and genome sequencing. We reviewed the technical aspects, benefits, risks, and limitations of these tests, including the consent form for [employer] prenatal genome. The couple wishes to obtain as much information as possible. Amniocentesis was performed today by Dr. Arnie Bibber, MFM. We ordered karyotype, AF-AFP, and CMV and toxoplasmosis PCR studies (due to ventriculomegaly) through [facility] as well as trio prenatal genome through [employer]. The couple opted out of [facility] secondary findings and opted in for research participation for prenatal genome. We also discussed genetic discrimination laws (GINA) that apply to health insurance and employers with greater than 15 employees but do not apply to life and long term care and disability insurance.   We reviewed that if a chromosomal or single gene condition is detected, this can help inform us  of additional clinical features, prognosis, and next available steps. We reviewed that this can allow them to connect with different specialists for the care of the newborn, and it can provide additional information to assist in the decision of continuation of the pregnancy or termination.  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 Carriers are not expected to have symptoms. Please see report for details. She screened negative for the other conditions screened for (CF, alpha thalassemia, beta-hemoglobinopathies, and fragile X syndrome). Previous carrier screening was performed through [facility]; recent carrier screening was performed through [employer]. This greatly reduces but does not eliminate the chance of being a carrier of these four conditions.  Haley Kramer already had genetic counseling regarding  this result in 2023, and [facility] had carrier  screening for SMA, CF, and fragile X syndrome that were negative. This greatly reduces but does not eliminate the chance of the couple having an affected child with these conditions. Please see GC note for further details.   Newborn Screening. The [place]  Newborn Screening (NBS) program will screen all newborn babies for cystic fibrosis, spinal muscular atrophy, hemoglobinopathies, and numerous other conditions.  Previous Testing Completed:  Low risk NIPS: Haley Kramer previously completed noninvasive prenatal screening (NIPS) in this pregnancy. The result is low risk, consistent with a female fetus. This screening significantly reduces but does not eliminate the chance that the current pregnancy has Down syndrome (trisomy 45), trisomy 22, trisomy 39, common sex chromosome conditions, and 22q11.2 microdeletion syndrome. Please see report for details. There are many genetic conditions that cannot be detected by NIPS.   Negative ms-AFP screening: Haley Kramer previously completed a maternal serum AFP screen in this pregnancy. The result is screen negative. Please see report for details. A negative result reduces the risk that the current pregnancy has an open neural tube defect. Closed neural tube defects and some open defects may not be detected by this screen.   Plan of Care:   Karyotype, AF-AFP, CMV PCR, and toxoplasmosis PCR through [facility] were ordered on the amniotic fluid sample. Trio whole genome sequencing through [employer] was also ordered. [facility] was drawn for both labs.   Informed consent was obtained. All questions were answered.   135 minutes were spent on the date of the encounter in service to the patient including preparation, face-to-face consultation, discussion of test reports and available next steps, pedigree construction, genetic risk assessment, documentation, and care coordination.    Thank you for sharing in the care of Haley Kramer with us .   Please do not hesitate to contact us  at [phone] if you have any questions.   Haley Kindle, MS, [facility] Certified Genetic Counselor   Genetic counseling student involved in appointment: No.

## 2023-09-02 NOTE — Progress Notes (Unsigned)
 [facility] for Maternal Fetal Care at [facility] [address] Phone:  [phone]   Fax:  [phone]      In-Person Genetic Counseling Clinic Note:   I spoke with 38 y.o. 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 She was referred by [facility]. She was accompanied by her husband Haley Kramer.   Pregnancy History:    [id number]. EGA: [redacted]w[redacted]d by LMP. EDD: 01/21/2024. This is Haley Kramer's fifth pregnancy. She has a healthy 32 yo daughter and a 74 mo son. Please see his history below. Haley Kramer reported two miscarriages. Her miscarriage in 2022 had Haley Kramer POC testing which showed trisomy 74. Chromosome anomalies account for a majority of first trimester miscarriages, with trisomy 16 being the most common. Recurrence risk is estimated to be 1% or the age-related risk, whichever is greater. Medications were reviewed with the nurse. Denies personal history of diabetes, high blood pressure, thyroid conditions, and seizures. Denies bleeding, infections, and fevers in this pregnancy. Denies using tobacco, alcohol, or street drugs in this pregnancy.   Family History:    A three-generation pedigree was drawn by pediatric genetic counselor Haley Kast, MS, CGC at [facility]. The couple reports no recent birth or deaths and no genetic or major health diagnoses since the pedigree was elicited. A copy of this pedigree that was redrawn during this session was scanned into the patient's chart.  The couple's 21 mo son Haley Kramer had genetic testing due to a suspected genetic condition, likely skeletal dysplasia. The anatomy ultrasound was normal; however, a follow-up ultrasound at 30w gestation showed mild frontal bossing, short long bones, trident-shaped hands, mild bell shaped chest, and polyhydramnios. Skeletal dysplasia was suspected, and chromosome analysis and a skeletal dysplasia panel were ordered on an amniotic  fluid sample. Those all returned as negative. Please see those reports for details.  Follow-up ultrasound at 36w gestation showed bilateral ventriculomegaly as well. Haley Kramer was seen at [facility], where skeletal dysplasia was also suspected. 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 This was not sent out as a trio test, so it is unknown if this variant was inherited or arose de novo. Per the pediatric genetics team at [facility], this VUS does not confer a clear diagnosis. Otherwise genetic testing has been negative. Please see those reports for details. The couple reports Haley Kramer is doing very well overall. He does not have hearing loss, which was initially suspected. He does not appear to have intellectual disability. 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  Haley Kramer reports her two nephews have autism, no health concerns or known genetic testing. Recurrence risk is difficult to estimate without known medical or genetic test reports, since autism can be multifactorial or part of a genetic condition. Haley Kramer had negative fragile X carrier screening.  Maternal ethnicity reported as Black and paternal ethnicity reported as Black. Denies Ashkenazi Jewish ancestry.  Family history not remarkable for consanguinity, individuals with birth defects, intellectual disability, autism spectrum disorder, multiple spontaneous abortions, still births, or unexplained neonatal death.   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 The anatomy ultrasound was otherwise normal. Please see ultrasound report for details.   We reviewed agenesis of the corpus callosum (ACC). This is one of several disorders of the corpus callosum, the structure that connects the two cerebral hemispheres of the brain. In [facility], the corpus callosum is partially or completely absent. It can occur as an isolated condition or with other cerebral abnormalities including Arnold-Chiari Malformation, Dandy Walker Malformation, schizencephaly, and holoprosencephaly. The effects of the disorder range from mild to severe, depending on associated brain abnormalities as well as if an underlying genetic condition is present. A fetal MRI may be useful to delineate the CNS anatomy. ACC can also be associated with malformations in other parts of the body. When ACC is visualized on ultrasound, fetal echocardiography should be performed to detect congenital heart disease. It is estimated that 65% of isolated ACC cases result in typical outcomes and development. Because 20% of ACC cases are associated with chromosomal abnormalities and other genetic syndromes, genetic testing to determine whether a case is isolated or syndromic is indicated. Chromosomal conditions that cause ACC include aneuploidies, such as trisomy 39 and trisomy 40, and chromosomal deletions and duplications, such as 4p deletion syndrome. ACC can also occur as part of single gene conditions, such as tuberous sclerosis, Apert syndrome, and Joubert syndrome.  We reviewed that diagnostic testing is the only way to definitively test for genetic conditions prenatally. We reviewed genes, chromosomes, and inheritance patterns. We discussed available prenatal diagnostic testing options from amniocentesis including chromosomal analysis and genome sequencing. We reviewed the technical aspects, benefits, risks, and limitations of these  tests, including the consent form for [employer] prenatal genome. The couple wishes to obtain as much information as possible. Amniocentesis was performed today by Dr. Arnie Bibber, MFM. We ordered karyotype, AF-AFP, and CMV and toxoplasmosis PCR studies (due to ventriculomegaly) through [facility] as well as trio prenatal genome through [employer]. The couple opted out of [facility] secondary findings and opted in for research participation for prenatal genome. We also discussed genetic discrimination laws (GINA) that apply to health insurance and employers with greater than 15 employees but do not apply to life and long term care and disability insurance.   We reviewed that if a chromosomal or single gene condition is detected, this can help inform us  of additional clinical features, prognosis, and next available steps. We reviewed that this can allow them to connect with different specialists for the care of the newborn, and it can provide additional information to assist in the decision of continuation of the pregnancy or termination.  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 Carriers are not expected to have symptoms. Please see report for details. She screened negative for the other conditions screened for (CF, alpha thalassemia, beta-hemoglobinopathies, and fragile X syndrome). Previous carrier screening was performed through [facility]; recent carrier screening was performed through [employer]. This greatly reduces but does not eliminate the chance of being a carrier of these four conditions.  Haley Kramer already had genetic counseling regarding this result in 2023, and [facility] had carrier screening for SMA, CF, and fragile X syndrome that were negative. This greatly reduces but does not eliminate the chance of the couple having an affected child with these conditions. Please  see  GC note for further details.   Newborn Screening. The [place]  Newborn Screening (NBS) program will screen all newborn babies for cystic fibrosis, spinal muscular atrophy, hemoglobinopathies, and numerous other conditions.  Previous Testing Completed:  Low risk NIPS: Haley Kramer previously completed noninvasive prenatal screening (NIPS) in this pregnancy. The result is low risk, consistent with a female fetus. This screening significantly reduces but does not eliminate the chance that the current pregnancy has Down syndrome (trisomy 75), trisomy 41, trisomy 65, common sex chromosome conditions, and 22q11.2 microdeletion syndrome. Please see report for details. There are many genetic conditions that cannot be detected by NIPS.   Negative ms-AFP screening: Haley Kramer previously completed a maternal serum AFP screen in this pregnancy. The result is screen negative. Please see report for details. A negative result reduces the risk that the current pregnancy has an open neural tube defect. Closed neural tube defects and some open defects may not be detected by this screen.   Plan of Care:   Karyotype, AF-AFP, CMV PCR, and toxoplasmosis PCR through [facility] were ordered on the amniotic fluid sample. Trio whole genome sequencing through [employer] was also ordered. [facility] was drawn.   Informed consent was obtained. All questions were answered.   135 minutes were spent on the date of the encounter in service to the patient including preparation, face-to-face consultation, discussion of test reports and available next steps, pedigree construction, genetic risk assessment, documentation, and care coordination.    Thank you for sharing in the care of Haley Kramer with us .  Please do not hesitate to contact us  at [phone] if you have any questions.   Haley Kindle, MS, [facility] Certified Genetic Counselor   Genetic counseling student involved in appointment: No.

## 2023-09-03 ENCOUNTER — Encounter: Payer: Self-pay | Admitting: Obstetrics and Gynecology

## 2023-09-03 ENCOUNTER — Other Ambulatory Visit: Payer: Self-pay | Admitting: Obstetrics and Gynecology

## 2023-09-03 DIAGNOSIS — O10912 Unspecified pre-existing hypertension complicating pregnancy, second trimester: Secondary | ICD-10-CM

## 2023-09-03 DIAGNOSIS — O283 Abnormal ultrasonic finding on antenatal screening of mother: Secondary | ICD-10-CM | POA: Insufficient documentation

## 2023-09-03 DIAGNOSIS — O09522 Supervision of elderly multigravida, second trimester: Secondary | ICD-10-CM

## 2023-09-13 ENCOUNTER — Encounter (HOSPITAL_COMMUNITY): Payer: Self-pay | Admitting: Obstetrics & Gynecology

## 2023-09-13 ENCOUNTER — Encounter (HOSPITAL_COMMUNITY): Payer: Self-pay | Admitting: Anesthesiology

## 2023-09-13 ENCOUNTER — Inpatient Hospital Stay (HOSPITAL_COMMUNITY)
Admission: AD | Admit: 2023-09-13 | Discharge: 2023-09-15 | DRG: 797 | Disposition: A | Discharge status: Home / Self Care | Attending: Family Medicine | Admitting: Family Medicine

## 2023-09-13 ENCOUNTER — Inpatient Hospital Stay (HOSPITAL_COMMUNITY)

## 2023-09-13 ENCOUNTER — Encounter: Payer: Self-pay | Admitting: Oncology

## 2023-09-13 DIAGNOSIS — O9902 Anemia complicating childbirth: Secondary | ICD-10-CM | POA: Diagnosis present

## 2023-09-13 DIAGNOSIS — O1092 Unspecified pre-existing hypertension complicating childbirth: Secondary | ICD-10-CM | POA: Diagnosis present

## 2023-09-13 DIAGNOSIS — O99019 Anemia complicating pregnancy, unspecified trimester: Secondary | ICD-10-CM | POA: Diagnosis present

## 2023-09-13 DIAGNOSIS — D509 Iron deficiency anemia, unspecified: Secondary | ICD-10-CM | POA: Diagnosis present

## 2023-09-13 DIAGNOSIS — O09523 Supervision of elderly multigravida, third trimester: Secondary | ICD-10-CM | POA: Diagnosis not present

## 2023-09-13 DIAGNOSIS — O99214 Obesity complicating childbirth: Secondary | ICD-10-CM | POA: Diagnosis present

## 2023-09-13 DIAGNOSIS — O364XX0 Maternal care for intrauterine death, not applicable or unspecified: Principal | ICD-10-CM | POA: Diagnosis present

## 2023-09-13 DIAGNOSIS — K219 Gastro-esophageal reflux disease without esophagitis: Secondary | ICD-10-CM | POA: Diagnosis present

## 2023-09-13 DIAGNOSIS — O368330 Maternal care for abnormalities of the fetal heart rate or rhythm, third trimester, not applicable or unspecified: Secondary | ICD-10-CM

## 2023-09-13 DIAGNOSIS — O1002 Pre-existing essential hypertension complicating childbirth: Secondary | ICD-10-CM | POA: Diagnosis not present

## 2023-09-13 DIAGNOSIS — O368120 Decreased fetal movements, second trimester, not applicable or unspecified: Secondary | ICD-10-CM | POA: Diagnosis present

## 2023-09-13 DIAGNOSIS — O9962 Diseases of the digestive system complicating childbirth: Secondary | ICD-10-CM | POA: Diagnosis present

## 2023-09-13 DIAGNOSIS — O34211 Maternal care for low transverse scar from previous cesarean delivery: Secondary | ICD-10-CM | POA: Diagnosis not present

## 2023-09-13 DIAGNOSIS — Z6791 Unspecified blood type, Rh negative: Secondary | ICD-10-CM | POA: Diagnosis not present

## 2023-09-13 DIAGNOSIS — I10 Essential (primary) hypertension: Secondary | ICD-10-CM | POA: Diagnosis present

## 2023-09-13 DIAGNOSIS — Z3A21 21 weeks gestation of pregnancy: Secondary | ICD-10-CM

## 2023-09-13 DIAGNOSIS — O26893 Other specified pregnancy related conditions, third trimester: Secondary | ICD-10-CM | POA: Diagnosis present

## 2023-09-13 DIAGNOSIS — Z833 Family history of diabetes mellitus: Secondary | ICD-10-CM

## 2023-09-13 DIAGNOSIS — O99212 Obesity complicating pregnancy, second trimester: Secondary | ICD-10-CM

## 2023-09-13 DIAGNOSIS — F32A Depression, unspecified: Secondary | ICD-10-CM | POA: Diagnosis present

## 2023-09-13 DIAGNOSIS — O99844 Bariatric surgery status complicating childbirth: Secondary | ICD-10-CM | POA: Diagnosis present

## 2023-09-13 DIAGNOSIS — O328XX0 Maternal care for other malpresentation of fetus, not applicable or unspecified: Secondary | ICD-10-CM | POA: Diagnosis present

## 2023-09-13 DIAGNOSIS — O10012 Pre-existing essential hypertension complicating pregnancy, second trimester: Secondary | ICD-10-CM

## 2023-09-13 DIAGNOSIS — O99344 Other mental disorders complicating childbirth: Secondary | ICD-10-CM | POA: Diagnosis present

## 2023-09-13 DIAGNOSIS — O09522 Supervision of elderly multigravida, second trimester: Secondary | ICD-10-CM

## 2023-09-13 DIAGNOSIS — E669 Obesity, unspecified: Secondary | ICD-10-CM

## 2023-09-13 DIAGNOSIS — Z5971 Insufficient health insurance coverage: Secondary | ICD-10-CM

## 2023-09-13 DIAGNOSIS — Z9884 Bariatric surgery status: Secondary | ICD-10-CM

## 2023-09-13 DIAGNOSIS — Z79899 Other long term (current) drug therapy: Secondary | ICD-10-CM

## 2023-09-13 DIAGNOSIS — O09292 Supervision of pregnancy with other poor reproductive or obstetric history, second trimester: Secondary | ICD-10-CM | POA: Diagnosis not present

## 2023-09-13 DIAGNOSIS — O34219 Maternal care for unspecified type scar from previous cesarean delivery: Secondary | ICD-10-CM | POA: Diagnosis present

## 2023-09-13 DIAGNOSIS — Z98891 History of uterine scar from previous surgery: Secondary | ICD-10-CM

## 2023-09-13 DIAGNOSIS — Z8249 Family history of ischemic heart disease and other diseases of the circulatory system: Secondary | ICD-10-CM | POA: Diagnosis not present

## 2023-09-13 DIAGNOSIS — F339 Major depressive disorder, recurrent, unspecified: Secondary | ICD-10-CM | POA: Diagnosis present

## 2023-09-13 DIAGNOSIS — O368130 Decreased fetal movements, third trimester, not applicable or unspecified: Secondary | ICD-10-CM | POA: Diagnosis not present

## 2023-09-13 DIAGNOSIS — F418 Other specified anxiety disorders: Secondary | ICD-10-CM | POA: Diagnosis not present

## 2023-09-13 DIAGNOSIS — Z3A00 Weeks of gestation of pregnancy not specified: Secondary | ICD-10-CM | POA: Diagnosis not present

## 2023-09-13 HISTORY — DX: Anxiety disorder, unspecified: F41.9

## 2023-09-13 HISTORY — DX: Depression, unspecified: F32.A

## 2023-09-13 LAB — COMPREHENSIVE METABOLIC PANEL WITH GFR
ALT: 68 U/L — ABNORMAL HIGH (ref 0–44)
AST: 59 U/L — ABNORMAL HIGH (ref 15–41)
Albumin: 3.2 g/dL — ABNORMAL LOW (ref 3.5–5.0)
Alkaline Phosphatase: 69 U/L (ref 38–126)
Anion gap: 8 (ref 5–15)
BUN: 5 mg/dL — ABNORMAL LOW (ref 6–20)
CO2: 17 mmol/L — ABNORMAL LOW (ref 22–32)
Calcium: 8.5 mg/dL — ABNORMAL LOW (ref 8.9–10.3)
Chloride: 110 mmol/L (ref 98–111)
Creatinine, Ser: 0.32 mg/dL — ABNORMAL LOW (ref 0.44–1.00)
GFR, Estimated: >60 mL/min (ref >60)
Glucose, Bld: 87 mg/dL (ref 70–99)
Potassium: 3.1 mmol/L — ABNORMAL LOW (ref 3.5–5.1)
Sodium: 135 mmol/L (ref 135–145)
Total Bilirubin: 0.6 mg/dL (ref 0.0–1.2)
Total Protein: 6.2 g/dL — ABNORMAL LOW (ref 6.5–8.1)

## 2023-09-13 LAB — URINALYSIS, ROUTINE W REFLEX MICROSCOPIC
Bilirubin Urine: NEGATIVE
Glucose, UA: NEGATIVE mg/dL
Hgb urine dipstick: NEGATIVE
Ketones, ur: NEGATIVE mg/dL
Leukocytes,Ua: NEGATIVE
Nitrite: NEGATIVE
Protein, ur: NEGATIVE mg/dL
Specific Gravity, Urine: 1.013 (ref 1.005–1.030)
pH: 6 (ref 5.0–8.0)

## 2023-09-13 LAB — DIC (DISSEMINATED INTRAVASCULAR COAGULATION)PANEL
D-Dimer, Quant: 0.73 ug{FEU}/mL — ABNORMAL HIGH (ref 0.00–0.50)
Fibrinogen: 400 mg/dL (ref 210–475)
INR: 1.1 (ref 0.8–1.2)
Platelets: 185 10*3/uL (ref 150–400)
Prothrombin Time: 14.8 s (ref 11.4–15.2)
Smear Review: NONE SEEN
aPTT: 30 s (ref 24–36)

## 2023-09-13 LAB — CBC
HCT: 38.2 % (ref 36.0–46.0)
Hemoglobin: 12.8 g/dL (ref 12.0–15.0)
MCH: 32 pg (ref 26.0–34.0)
MCHC: 33.5 g/dL (ref 30.0–36.0)
MCV: 95.5 fL (ref 80.0–100.0)
Platelets: 197 10*3/uL (ref 150–400)
RBC: 4 MIL/uL (ref 3.87–5.11)
RDW: 12.8 % (ref 11.5–15.5)
WBC: 12.7 10*3/uL — ABNORMAL HIGH (ref 4.0–10.5)
nRBC: 0 % (ref 0.0–0.2)

## 2023-09-13 MED ORDER — FENTANYL CITRATE (PF) 100 MCG/2ML IJ SOLN
50.0000 ug | INTRAMUSCULAR | Status: DC | PRN
Start: 2023-09-13 — End: 2023-09-14
  Administered 2023-09-14 (×3): 100 ug via INTRAVENOUS
  Filled 2023-09-13 (×3): qty 2

## 2023-09-13 MED ORDER — OXYCODONE-ACETAMINOPHEN 5-325 MG PO TABS: 2.0000 | ORAL_TABLET | ORAL | Status: DC | PRN

## 2023-09-13 MED ORDER — DIPHENHYDRAMINE HCL 50 MG/ML IJ SOLN: 12.5000 mg | INTRAMUSCULAR | Status: DC | PRN

## 2023-09-13 MED ORDER — SOD CITRATE-CITRIC ACID 500-334 MG/5ML PO SOLN
30.0000 mL | ORAL | Status: DC | PRN
  Administered 2023-09-14: 30 mL via ORAL
  Filled 2023-09-13: qty 30

## 2023-09-13 MED ORDER — LACTATED RINGERS IV SOLN
500.0000 mL | Freq: Once | INTRAVENOUS | Status: AC
  Administered 2023-09-14: 500 mL via INTRAVENOUS

## 2023-09-13 MED ORDER — MISOPROSTOL 50MCG HALF TABLET
50.0000 ug | ORAL_TABLET | Freq: Once | ORAL | Status: AC
  Administered 2023-09-13: 50 ug via ORAL
  Filled 2023-09-13: qty 1

## 2023-09-13 MED ORDER — EPHEDRINE 5 MG/ML INJ: 10.0000 mg | INTRAVENOUS | Status: DC | PRN

## 2023-09-13 MED ORDER — OXYCODONE-ACETAMINOPHEN 5-325 MG PO TABS
1.0000 | ORAL_TABLET | ORAL | Status: DC | PRN
  Administered 2023-09-13 – 2023-09-14 (×3): 1 via ORAL
  Filled 2023-09-13 (×3): qty 1

## 2023-09-13 MED ORDER — PHENYLEPHRINE 80 MCG/ML (10ML) SYRINGE FOR IV PUSH (FOR BLOOD PRESSURE SUPPORT): 80.0000 ug | PREFILLED_SYRINGE | INTRAVENOUS | Status: DC | PRN

## 2023-09-13 MED ORDER — ACETAMINOPHEN 325 MG PO TABS: 650.0000 mg | ORAL_TABLET | ORAL | Status: DC | PRN

## 2023-09-13 MED ORDER — MISOPROSTOL 200 MCG PO TABS: 600.0000 ug | ORAL_TABLET | Freq: Four times a day (QID) | ORAL | Status: DC

## 2023-09-13 MED ORDER — LACTATED RINGERS IV SOLN: 500.0000 mL | INTRAVENOUS | Status: DC | PRN

## 2023-09-13 MED ORDER — MISOPROSTOL 200 MCG PO TABS
400.0000 ug | ORAL_TABLET | Freq: Four times a day (QID) | ORAL | Status: DC
  Administered 2023-09-13 – 2023-09-14 (×3): 400 ug via VAGINAL
  Filled 2023-09-13 (×3): qty 2

## 2023-09-13 MED ORDER — ONDANSETRON HCL 4 MG/2ML IJ SOLN
4.0000 mg | Freq: Four times a day (QID) | INTRAMUSCULAR | Status: DC | PRN
  Administered 2023-09-13 – 2023-09-14 (×3): 4 mg via INTRAVENOUS
  Filled 2023-09-13 (×3): qty 2

## 2023-09-13 MED ORDER — OXYTOCIN-SODIUM CHLORIDE 30-0.9 UT/500ML-% IV SOLN
2.5000 [IU]/h | INTRAVENOUS | Status: DC
  Filled 2023-09-13: qty 500

## 2023-09-13 MED ORDER — LIDOCAINE HCL (PF) 1 % IJ SOLN: 30.0000 mL | INTRAMUSCULAR | Status: DC | PRN

## 2023-09-13 MED ORDER — FENTANYL-BUPIVACAINE-NACL 0.5-0.125-0.9 MG/250ML-% EP SOLN
12.0000 mL/h | EPIDURAL | Status: DC | PRN
  Filled 2023-09-13: qty 250

## 2023-09-13 MED ORDER — OXYTOCIN BOLUS FROM INFUSION: 333.0000 mL | Freq: Once | INTRAVENOUS | Status: DC

## 2023-09-13 MED ORDER — LACTATED RINGERS IV SOLN: INTRAVENOUS | Status: DC

## 2023-09-13 NOTE — Plan of Care (Signed)

## 2023-09-13 NOTE — Progress Notes (Signed)
 Iv not infusing by pump. Site puffy. Saline locked and IV team called Stat to restart.

## 2023-09-13 NOTE — H&P (Signed)
 OBSTETRIC ADMISSION HISTORY AND PHYSICAL  Dezirea Stranger Drohan is a 38 y.o. female [phone] with IUP at [redacted]w[redacted]d (dated by L/6, Estimated Date of Delivery: 01/21/24) who presented to MAU for decreased fetal movement. Reports decreased fetal movement x 2 days, had used her Doppler at home to check FHT and reports it having been low 2 days ago, then was unable to hear FHTs yesterday or today. She has a toddler at home with significant medical needs, which has caused her additional stress. Reports hx SAB x 2. 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  Prenatal History/Complications:  - Chronic hypertension - Anemia of pregnancy - Hx bariatric surgery - Hx C/S for malpresentation, followed by vacuum assisted VBAC  - Rh negative status - AMA - Elevated LFTs  Past Medical History: Past Medical History:  Diagnosis Date   Anxiety    Depression    IDA (iron  deficiency anemia)    Incomplete abortion 01/14/2021   Iron  deficiency anemia, unspecified iron  deficiency anemia type    Obesity    PONV (postoperative nausea and vomiting)    Vitamin D  deficiency     Past Surgical History: Past Surgical History:  Procedure Laterality Date   CESAREAN SECTION  03/23/2011   CHOLECYSTECTOMY  11/2022   DILATION AND EVACUATION N/A 01/22/2021   Procedure: DILATATION AND EVACUATION;  Surgeon: Heron Lord, MD;  Location: [facility] ORS;  Service: Gynecology;  Laterality: N/A;   ESOPHAGOGASTRODUODENOSCOPY  2016   gallbladder     GASTRIC BYPASS  2016   PERINEAL LACERATION REPAIR N/A 11/12/2021   Procedure: SUTURE REPAIR PERINEAL LACERATION;  Surgeon: Abigail Abler, MD;  Location: [facility];  Service: Obstetrics;  Laterality: N/A;    Obstetrical History: OB History     Gravida  5   Para  2   Term  2   Preterm  0   AB  2   Living  2      SAB  2   IAB  0   Ectopic  0   Multiple  0   Live Births  2           Social  History Social History   Socioeconomic History   Marital status: Married    Spouse name: Thurston Flow   Number of children: 1   Years of education: Not on file   Highest education level: Not on file  Occupational History   Not on file  Tobacco Use   Smoking status: Never   Smokeless tobacco: Never  Vaping Use   Vaping status: Never Used  Substance and Sexual Activity   Alcohol use: Not Currently    Comment: in the past   Drug use: Never   Sexual activity: Yes    Partners: Male    Birth control/protection: None    Comment: hx mirena  x 10 yr/removed 05/11/2020  Other Topics Concern   Not on file  Social History Narrative   Not on file   Social Drivers of Health   Financial Resource Strain: Not on file  Food Insecurity: No Food Insecurity (09/13/2023)   Hunger Vital Sign    Worried About Running Out of Food in the Last Year: Never true    Ran Out of Food in the Last Year: Never true  Transportation Needs: No Transportation Needs (09/13/2023)   PRAPARE - Administrator, Civil Service (Medical): No    Lack of Transportation (  Non-Medical): No  Physical Activity: Not on file  Stress: Not on file  Social Connections: Unknown (09/13/2023)   Social Connection and Isolation Panel [NHANES]    Frequency of Communication with Friends and Family: Never    Frequency of Social Gatherings with Friends and Family: Never    Attends Religious Services: Never    Diplomatic Services operational officer: No    Attends Engineer, structural: Not on file    Marital Status: Not on file    Family History: Family History  Problem Relation Age of Onset   Hypertension Mother    Diabetes Mother    Hypertension Father    Diabetes Father    Bipolar disorder Sister    Schizophrenia Sister    Narcolepsy Sister    Healthy Sister    Hypertension Maternal Grandmother    Diabetes Maternal Grandmother    Hypertension Maternal Grandfather    Diabetes Maternal Grandfather     Hypertension Paternal Grandmother    Hypertension Paternal Grandfather     Allergies: No Known Allergies  Medications Prior to Admission  Medication Sig Dispense Refill Last Dose/Taking   acetaminophen  (TYLENOL ) 500 MG tablet Take 2 tablets (1,000 mg total) by mouth every 6 (six) hours as needed for moderate pain, fever, headache or mild pain. 100 tablet 0 Past Week   Doxylamine -Pyridoxine  (DICLEGIS ) 10-10 MG TBEC Take 2 tablets by mouth at bedtime. If symptoms persist, add one tablet in the morning and one in the afternoon 100 tablet 5 09/12/2023 Evening   Prenat-FeAsp-Meth-FA-DHA w/o A (PRENATE PIXIE ) 10-0.6-0.4-200 MG CAPS Take 1 capsule by mouth daily. 30 capsule 12 09/11/2023   doxylamine , Sleep, (UNISOM ) 25 MG tablet Take 50 mg by mouth at bedtime as needed.   09/11/2023   escitalopram  (LEXAPRO ) 10 MG tablet TAKE 0.5 TABLETS (5 MG TOTAL) BY MOUTH DAILY FOR 8 DAYS, THEN 1 TABLET (10 MG TOTAL) DAILY. (Patient not taking: Reported on 09/01/2023) 78 tablet 5    hydrOXYzine  (VISTARIL ) 25 MG capsule Take 1 capsule (25 mg total) by mouth every 6 (six) hours as needed for anxiety. (Patient not taking: Reported on 09/01/2023) 30 capsule 2    promethazine  (PHENERGAN ) 25 MG tablet TAKE 1 TABLET BY MOUTH EVERY 6 HOURS AS NEEDED FOR NAUSEA OR VOMITING. 30 tablet 1 09/11/2023     Review of Systems  All systems reviewed and negative except as stated in HPI.  Blood pressure 130/70, pulse 77, temperature 98.3 F (36.8 C), temperature source Oral, resp. rate 18, height 5\' 8"  (1.727 m), weight 98.4 kg, last menstrual period 04/16/2023, SpO2 100%, unknown if currently breastfeeding. General appearance: alert, cooperative, and tearful Lungs: breathing comfortably on room air Heart: regular rate Abdomen: soft, non-tender Extremities: no edema of bilateral lower extremities Presentation: breech    Prenatal labs: ABO, Rh: --/--/O NEG (05/04 1247) Antibody: POS (05/04 1247) Rubella: 4.93 (02/06 1057) RPR: Non  Reactive (02/06 1057)  HBsAg: Negative (02/06 1057)  HIV: Non Reactive (02/06 1057)  Genetic screening low risk Anatomy US  bilateral ventriculomegaly, absent CSP, unable to identify corpus callosum  Prenatal Transfer Tool  Maternal Diabetes: No Genetic Screening: Normal Maternal Ultrasounds/Referrals: Other:see above Fetal Ultrasounds or other Referrals:  Referred to Materal Fetal Medicine  Maternal Substance Abuse:  No Significant Maternal Medications:  None Significant Maternal Lab Results:  Rh negative Number of Prenatal Visits:greater than 3 verified prenatal visits Other Comments:  None  Results for orders placed or performed during the hospital encounter of 09/13/23 (from the  past 24 hours)  Urinalysis, Routine w reflex microscopic -Urine, Clean Catch   Collection Time: Sep 25, 2023 10:25 AM  Result Value Ref Range   Color, Urine YELLOW YELLOW   APPearance CLEAR CLEAR   Specific Gravity, Urine 1.013 1.005 - 1.030   pH 6.0 5.0 - 8.0   Glucose, UA NEGATIVE NEGATIVE mg/dL   Hgb urine dipstick NEGATIVE NEGATIVE   Bilirubin Urine NEGATIVE NEGATIVE   Ketones, ur NEGATIVE NEGATIVE mg/dL   Protein, ur NEGATIVE NEGATIVE mg/dL   Nitrite NEGATIVE NEGATIVE   Leukocytes,Ua NEGATIVE NEGATIVE  Type and screen [facility]   Collection Time: 2023/09/25 12:47 PM  Result Value Ref Range   ABO/RH(D) O NEG    Antibody Screen POS    Sample Expiration 09/16/2023,2359    Antibody Identification      PASSIVELY ACQUIRED ANTI-D Performed at [facility], [address], [place], [place] [address]   CBC   Collection Time: 09/25/23  1:00 PM  Result Value Ref Range   WBC 12.7 (H) 4.0 - 10.5 K/uL   RBC 4.00 3.87 - 5.11 MIL/uL   Hemoglobin 12.8 12.0 - 15.0 g/dL   HCT 19.1 47.8 - 29.5 %   MCV 95.5 80.0 - 100.0 fL   MCH 32.0 26.0 - 34.0 pg   MCHC 33.5 30.0 - 36.0 g/dL   RDW 62.1 30.8 - 65.7 %   Platelets 197 150 - 400 K/uL   nRBC 0.0 0.0 - 0.2 %  DIC Panel ONCE - STAT    Collection Time: Sep 25, 2023  2:13 PM  Result Value Ref Range   Prothrombin Time 14.8 11.4 - 15.2 seconds   INR 1.1 0.8 - 1.2   aPTT 30 24 - 36 seconds   Fibrinogen  400 210 - 475 mg/dL   D-Dimer, Quant 8.46 (H) 0.00 - 0.50 ug/mL-FEU   Platelets 185 150 - 400 K/uL   Smear Review PENDING     Patient Active Problem List   Diagnosis Date Noted   Fetal demise before 22 weeks with retention of dead fetus 2023-09-25   Abnormal fetal ultrasound 09/03/2023   History of fetal abnormality in previous pregnancy, currently pregnant 07/29/2023   Elevated liver function tests 06/26/2023   Supervision of high risk pregnancy, antepartum 06/04/2023   History of delivery by vacuum extraction, currently pregnant 11/12/2021   History of bariatric surgery 10/22/2021   AMA (advanced maternal age) multigravida 35+ 08/13/2021   History of C-section 08/13/2021   Rh negative status during pregnancy 08/13/2021   Anemia in pregnancy 05/15/2021   Vitamin D  deficiency 05/15/2021   Depression 04/23/2021   Benign hypertension 06/13/2020    Assessment/Plan:  LIESE HILLING is a 38 y.o. [id number] at [redacted]w[redacted]d here for IOL following fetal demise at [redacted]w[redacted]d   #IOL: Plan is for Cytotec  400mg  q6h  discussed typical course of IOL w pt and her partner in detail  #Fetal demise at 21 weeks: Pt tearful but had strong suspicion of IUFD after being unable to hear FHT x 2 days. She is grieving appropriately, has husband at bedside with her and has support. She requests visit with chaplain and is okay with SW consult to offer resources. She reports significant psychosocial stressors including caring for her 68 month old son who has significant medical needs. 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 This has led to her being without insurance and pt reports concern regarding arrangements for [employer]  after her delivery.   #Chronic hypertension: Not on meds, BP  normotensive.  #H/o C/S followed by VBAC  #H/o bariatric surgery: Had gastric bypass    Melanie Spires, MD OB Fellow, Faculty Practice [facility], [facility] 09/13/2023 2:52 PM

## 2023-09-13 NOTE — Progress Notes (Signed)
 Dr Hubert Madden updated on pt arrival. Dr Hubert Madden approved regular diet for now (ok per dr Jarrell Merritts per charge RN) due to gestation and IUFD. Pt may take cytotec  po and defer SVE since pt has no signs of labor. Pt updated. Pt wants to eat first, then will call for Cytotec  once she has eaten.

## 2023-09-13 NOTE — MAU Note (Signed)
 Haley Kramer is a 38 y.o. at [redacted]w[redacted]d here in MAU reporting: hasn't slept in 2 days (anxiety-stressed with home situation- has a child with a trach). Has been cramping.  Feeling less fetal movement and hasn't been able to find heartbeat with monitor at home.  Has bad nausea that comes in waves.  Denies bleeding, LOF or d/c. Denies urinary symptoms.  Has been constipated.  Last BM was last night.  Onset of complaint: Friday evening (cramping) Pain score: mild Vitals:   09/13/23 0957  BP: 121/68  Pulse: 86  Resp: 18  Temp: 98.3 F (36.8 C)  SpO2: 100%     NUU:VOZDGU to hear.... pt reports last heard on Friday Lab orders placed from triage:  urine

## 2023-09-13 NOTE — Progress Notes (Signed)
 Pt eating lunch

## 2023-09-13 NOTE — MAU Provider Note (Signed)
 Chief Complaint: Decreased Fetal Movement and Abdominal Pain   Event Date/Time   First Provider Initiated Contact with Patient 09/13/23 1033      SUBJECTIVE HPI: Haley Kramer is a 38 y.o. [id number] at [redacted]w[redacted]d who presents to maternity admissions reporting decreased fetal movement, cramping, and she has not found heart tones on her home doppler. She reports stress at home and hasn't slept in 2 days (child at home with a trach).  She has been hearing the heart tones on her home doppler but cannot find them today.  She has abdominal cramping and intermittent nausea.  There is no bleeding or leaking fluid.      HPI  Past Medical History:  Diagnosis Date   Anxiety    Depression    IDA (iron  deficiency anemia)    Incomplete abortion 01/14/2021   Iron  deficiency anemia, unspecified iron  deficiency anemia type    Obesity    PONV (postoperative nausea and vomiting)    Vitamin D  deficiency    Past Surgical History:  Procedure Laterality Date   CESAREAN SECTION  03/23/2011   CHOLECYSTECTOMY  11/2022   DILATION AND EVACUATION N/A 01/22/2021   Procedure: DILATATION AND EVACUATION;  Surgeon: Heron Lord, MD;  Location: [facility] ORS;  Service: Gynecology;  Laterality: N/A;   ESOPHAGOGASTRODUODENOSCOPY  2016   gallbladder     GASTRIC BYPASS  2016   PERINEAL LACERATION REPAIR N/A 11/12/2021   Procedure: SUTURE REPAIR PERINEAL LACERATION;  Surgeon: Abigail Abler, MD;  Location: [facility];  Service: Obstetrics;  Laterality: N/A;   Social History   Socioeconomic History   Marital status: Single    Spouse name: aaron   Number of children: 1   Years of education: Not on file   Highest education level: Not on file  Occupational History   Not on file  Tobacco Use   Smoking status: Never   Smokeless tobacco: Never  Vaping Use   Vaping status: Never Used  Substance and Sexual Activity   Alcohol use: Not Currently    Comment: in the past   Drug use: Never   Sexual activity: Yes     Partners: Male    Birth control/protection: None    Comment: hx mirena  x 10 yr/removed 05/11/2020  Other Topics Concern   Not on file  Social History Narrative   Lives with mother and child   Social Drivers of Health   Financial Resource Strain: Not on file  Food Insecurity: No Food Insecurity (11/15/2021)   [facility] visits prior to [facility]/12/2022[facility]   Hunger Vital Sign    Worried About Running Out of Food in the Last Year: Never true    Ran Out of Food in the Last Year: Never true  Transportation Needs: No Transportation Needs (09/16/2021)   PRAPARE - Administrator, Civil Service (Medical): No    Lack of Transportation (Non-Medical): No  Physical Activity: Not on file  Stress: Not on file  Social Connections: Not on file  Intimate Partner Violence: Not At Risk (11/15/2021)   [facility] visits prior to 07/12/2022., [facility] visits prior to 07/12/2022.   Humiliation, Afraid, Rape, and Kick questionnaire    Fear of Current or Ex-Partner: No    Emotionally Abused: No    Physically Abused: No    Sexually Abused: No  No current facility-administered medications on file prior to encounter.   Current Outpatient Medications on File Prior to Encounter  Medication Sig Dispense Refill   acetaminophen  (TYLENOL ) 500 MG tablet Take 2 tablets (1,000 mg total) by mouth every 6 (six) hours as needed for moderate pain, fever, headache or mild pain. 100 tablet 0   Doxylamine -Pyridoxine  (DICLEGIS ) 10-10 MG TBEC Take 2 tablets by mouth at bedtime. If symptoms persist, add one tablet in the morning and one in the afternoon 100 tablet 5   Prenat-FeAsp-Meth-FA-DHA w/o A (PRENATE PIXIE ) 10-0.6-0.4-200 MG CAPS Take 1 capsule by mouth daily. 30 capsule 12   doxylamine , Sleep, (UNISOM ) 25 MG tablet Take 50 mg by mouth at  bedtime as needed.     escitalopram  (LEXAPRO ) 10 MG tablet TAKE 0.5 TABLETS (5 MG TOTAL) BY MOUTH DAILY FOR 8 DAYS, THEN 1 TABLET (10 MG TOTAL) DAILY. (Patient not taking: Reported on 09/01/2023) 78 tablet 5   hydrOXYzine  (VISTARIL ) 25 MG capsule Take 1 capsule (25 mg total) by mouth every 6 (six) hours as needed for anxiety. (Patient not taking: Reported on 09/01/2023) 30 capsule 2   promethazine  (PHENERGAN ) 25 MG tablet TAKE 1 TABLET BY MOUTH EVERY 6 HOURS AS NEEDED FOR NAUSEA OR VOMITING. 30 tablet 1   No Known Allergies  ROS:  Review of Systems  Constitutional:  Negative for chills, fatigue and fever.  Respiratory:  Negative for shortness of breath.   Cardiovascular:  Negative for chest pain.  Gastrointestinal:  Positive for abdominal pain and nausea.  Genitourinary:  Negative for difficulty urinating, dysuria, flank pain, pelvic pain, vaginal bleeding, vaginal discharge and vaginal pain.  Neurological:  Negative for dizziness and headaches.  Psychiatric/Behavioral: Negative.       I have reviewed patient's Past Medical Hx, Surgical Hx, Family Hx, Social Hx, medications and allergies.   Physical Exam  Patient Vitals for the past 24 hrs:  BP Temp Temp src Pulse Resp SpO2 Height Weight  09/13/23 0957 121/68 98.3 F (36.8 C) Oral 86 18 100 % 5\' 8"  (1.727 m) 98.4 kg   Constitutional: Well-developed, well-nourished female in no acute distress.  Cardiovascular: normal rate Respiratory: normal effort GI: Abd soft, non-tender. Pos BS x 4 MS: Extremities nontender, no edema, normal ROM Neurologic: Alert and oriented x 4.  GU: Neg CVAT.  PELVIC EXAM: Deferred  FHT not audible by doppler  Bedside US  by CNM without visible hearttones  LAB RESULTS No results found for this or any previous visit (from the past 24 hours).  O/Negative/-- (02/06 1057)  IMAGING Formal US  confirmed no FHT, full report pending  MAU Management/MDM: Orders Placed This Encounter  Procedures   US  MFM OB  Limited   Urinalysis, Routine w reflex microscopic -Urine, Clean Catch    No orders of the defined types were placed in this encounter.   Discussed US  findings with patient.  Questions answered.  Pt with a nurse to take care of her special needs child at home for today and tomorrow so desires induction and delivery so she can be discharged as soon as possible.  Pt admitted to L&D and is stable at time of transfer.    ASSESSMENT 1. IUFD at 20 weeks or more of gestation   2. [redacted] weeks gestation of pregnancy      PLAN Admit to L&D for IOL  Arlester Bence Certified Nurse-Midwife 09/13/2023  10:33 AM

## 2023-09-13 NOTE — Progress Notes (Signed)
 Pt states she would like cytotec  now and with a nausea medication.

## 2023-09-14 ENCOUNTER — Inpatient Hospital Stay (HOSPITAL_COMMUNITY): Admitting: Anesthesiology

## 2023-09-14 ENCOUNTER — Inpatient Hospital Stay (HOSPITAL_COMMUNITY)

## 2023-09-14 ENCOUNTER — Encounter (HOSPITAL_COMMUNITY): Payer: Self-pay | Admitting: Obstetrics & Gynecology

## 2023-09-14 ENCOUNTER — Encounter (HOSPITAL_COMMUNITY)
Admission: AD | Disposition: A | Discharge status: Home / Self Care | Payer: Self-pay | Source: Home / Self Care | Attending: Family Medicine

## 2023-09-14 ENCOUNTER — Other Ambulatory Visit: Payer: Self-pay

## 2023-09-14 DIAGNOSIS — O1002 Pre-existing essential hypertension complicating childbirth: Secondary | ICD-10-CM

## 2023-09-14 DIAGNOSIS — O34211 Maternal care for low transverse scar from previous cesarean delivery: Secondary | ICD-10-CM | POA: Diagnosis not present

## 2023-09-14 DIAGNOSIS — O368130 Decreased fetal movements, third trimester, not applicable or unspecified: Secondary | ICD-10-CM | POA: Diagnosis not present

## 2023-09-14 DIAGNOSIS — O364XX0 Maternal care for intrauterine death, not applicable or unspecified: Secondary | ICD-10-CM

## 2023-09-14 DIAGNOSIS — F418 Other specified anxiety disorders: Secondary | ICD-10-CM

## 2023-09-14 DIAGNOSIS — Z3A00 Weeks of gestation of pregnancy not specified: Secondary | ICD-10-CM

## 2023-09-14 DIAGNOSIS — Z3A21 21 weeks gestation of pregnancy: Secondary | ICD-10-CM

## 2023-09-14 DIAGNOSIS — I10 Essential (primary) hypertension: Secondary | ICD-10-CM

## 2023-09-14 DIAGNOSIS — O09523 Supervision of elderly multigravida, third trimester: Secondary | ICD-10-CM

## 2023-09-14 HISTORY — PX: DILATION AND EVACUATION: SHX1459

## 2023-09-14 LAB — CBC
HCT: 25 % — ABNORMAL LOW (ref 36.0–46.0)
Hemoglobin: 8.5 g/dL — ABNORMAL LOW (ref 12.0–15.0)
MCH: 32.6 pg (ref 26.0–34.0)
MCHC: 34 g/dL (ref 30.0–36.0)
MCV: 95.8 fL (ref 80.0–100.0)
Platelets: 148 10*3/uL — ABNORMAL LOW (ref 150–400)
RBC: 2.61 MIL/uL — ABNORMAL LOW (ref 3.87–5.11)
RDW: 12.8 % (ref 11.5–15.5)
WBC: 18 10*3/uL — ABNORMAL HIGH (ref 4.0–10.5)
nRBC: 0 % (ref 0.0–0.2)

## 2023-09-14 LAB — RPR: RPR Ser Ql: NONREACTIVE

## 2023-09-14 LAB — KLEIHAUER-BETKE STAIN
# Vials RhIg: 1
Fetal Cells %: 0 %
Quantitation Fetal Hemoglobin: 0 mL

## 2023-09-14 LAB — PREPARE RBC (CROSSMATCH)

## 2023-09-14 SURGERY — DILATION AND EVACUATION, UTERUS
Anesthesia: General | Laterality: Bilateral

## 2023-09-14 MED ORDER — ONDANSETRON HCL 4 MG PO TABS: 4.0000 mg | ORAL_TABLET | ORAL | Status: DC | PRN

## 2023-09-14 MED ORDER — PRENATAL MULTIVITAMIN CH: 1.0000 | ORAL_TABLET | Freq: Every day | ORAL | Status: DC

## 2023-09-14 MED ORDER — DOXYCYCLINE HYCLATE 100 MG IV SOLR
200.0000 mg | Freq: Once | INTRAVENOUS | Status: DC
  Administered 2023-09-14: 200 mg via INTRAVENOUS
  Filled 2023-09-14 (×2): qty 200

## 2023-09-14 MED ORDER — OXYCODONE HCL 5 MG/5ML PO SOLN: 5.0000 mg | ORAL | Status: DC | PRN

## 2023-09-14 MED ORDER — IBUPROFEN 600 MG PO TABS: 600.0000 mg | ORAL_TABLET | Freq: Four times a day (QID) | ORAL | Status: DC

## 2023-09-14 MED ORDER — TETANUS-DIPHTH-ACELL PERTUSSIS 5-2.5-18.5 LF-MCG/0.5 IM SUSY: 0.5000 mL | PREFILLED_SYRINGE | Freq: Once | INTRAMUSCULAR | Status: DC

## 2023-09-14 MED ORDER — STERILE WATER FOR IRRIGATION IR SOLN
Status: DC | PRN
  Administered 2023-09-14: 1000 mL

## 2023-09-14 MED ORDER — PHENYLEPHRINE HCL-NACL 20-0.9 MG/250ML-% IV SOLN: INTRAVENOUS | Status: DC | PRN

## 2023-09-14 MED ORDER — LEVONORGESTREL 20 MCG/DAY IU IUD: 1.0000 | INTRAUTERINE_SYSTEM | Freq: Once | INTRAUTERINE | Status: DC

## 2023-09-14 MED ORDER — FENTANYL CITRATE (PF) 100 MCG/2ML IJ SOLN
INTRAMUSCULAR | Status: DC | PRN
  Administered 2023-09-14: 50 ug via INTRAVENOUS
  Administered 2023-09-14: 100 ug via INTRAVENOUS

## 2023-09-14 MED ORDER — ONDANSETRON HCL 4 MG/2ML IJ SOLN
4.0000 mg | INTRAMUSCULAR | Status: DC | PRN
  Administered 2023-09-14: 4 mg via INTRAVENOUS
  Filled 2023-09-14: qty 2

## 2023-09-14 MED ORDER — DIPHENHYDRAMINE HCL 25 MG PO CAPS: 25.0000 mg | ORAL_CAPSULE | Freq: Four times a day (QID) | ORAL | Status: DC | PRN

## 2023-09-14 MED ORDER — RHO D IMMUNE GLOBULIN 1500 UNIT/2ML IJ SOSY
300.0000 ug | PREFILLED_SYRINGE | Freq: Once | INTRAMUSCULAR | Status: AC
  Administered 2023-09-14: 300 ug via INTRAVENOUS
  Filled 2023-09-14: qty 2

## 2023-09-14 MED ORDER — METHYLERGONOVINE MALEATE 0.2 MG/ML IJ SOLN
INTRAMUSCULAR | Status: DC | PRN
  Administered 2023-09-14: .2 mg via INTRAMUSCULAR

## 2023-09-14 MED ORDER — BENZOCAINE-MENTHOL 20-0.5 % EX AERO: 1.0000 | INHALATION_SPRAY | CUTANEOUS | Status: DC | PRN

## 2023-09-14 MED ORDER — ALBUMIN HUMAN 5 % IV SOLN: INTRAVENOUS | Status: DC | PRN

## 2023-09-14 MED ORDER — OXYCODONE HCL 5 MG PO TABS: 5.0000 mg | ORAL_TABLET | Freq: Once | ORAL | Status: DC | PRN

## 2023-09-14 MED ORDER — MISOPROSTOL 200 MCG PO TABS
400.0000 ug | ORAL_TABLET | Freq: Once | ORAL | Status: DC
  Filled 2023-09-14: qty 2

## 2023-09-14 MED ORDER — OXYCODONE-ACETAMINOPHEN 5-325 MG PO TABS: 1.0000 | ORAL_TABLET | ORAL | Status: DC | PRN

## 2023-09-14 MED ORDER — LEVONORGESTREL 20 MCG/DAY IU IUD
INTRAUTERINE_SYSTEM | INTRAUTERINE | Status: AC
  Filled 2023-09-14: qty 1

## 2023-09-14 MED ORDER — AMISULPRIDE (ANTIEMETIC) 5 MG/2ML IV SOLN: 10.0000 mg | Freq: Once | INTRAVENOUS | Status: DC | PRN

## 2023-09-14 MED ORDER — FERROUS SULFATE 325 (65 FE) MG PO TABS
325.0000 mg | ORAL_TABLET | ORAL | Status: DC
  Filled 2023-09-14: qty 1

## 2023-09-14 MED ORDER — ONDANSETRON HCL 4 MG/2ML IJ SOLN
INTRAMUSCULAR | Status: DC | PRN
  Administered 2023-09-14: 4 mg via INTRAVENOUS

## 2023-09-14 MED ORDER — TRANEXAMIC ACID-NACL 1000-0.7 MG/100ML-% IV SOLN
INTRAVENOUS | Status: DC | PRN
  Administered 2023-09-14: 1000 mg via INTRAVENOUS

## 2023-09-14 MED ORDER — PHENYLEPHRINE HCL-NACL 20-0.9 MG/250ML-% IV SOLN
INTRAVENOUS | Status: DC | PRN
  Administered 2023-09-14: 60 ug/min via INTRAVENOUS

## 2023-09-14 MED ORDER — RHO D IMMUNE GLOBULIN 1500 UNIT/2ML IJ SOSY
300.0000 ug | PREFILLED_SYRINGE | Freq: Once | INTRAMUSCULAR | Status: DC
  Filled 2023-09-14: qty 2

## 2023-09-14 MED ORDER — ACETAMINOPHEN 325 MG PO TABS
650.0000 mg | ORAL_TABLET | ORAL | Status: DC | PRN
  Administered 2023-09-15: 650 mg via ORAL
  Filled 2023-09-14: qty 2

## 2023-09-14 MED ORDER — WITCH HAZEL-GLYCERIN EX PADS: 1.0000 | MEDICATED_PAD | CUTANEOUS | Status: DC | PRN

## 2023-09-14 MED ORDER — OXYCODONE HCL 5 MG/5ML PO SOLN
10.0000 mg | ORAL | Status: DC | PRN
  Administered 2023-09-14: 10 mg via ORAL
  Filled 2023-09-14: qty 10

## 2023-09-14 MED ORDER — SUCCINYLCHOLINE CHLORIDE 200 MG/10ML IV SOSY
PREFILLED_SYRINGE | INTRAVENOUS | Status: AC
  Filled 2023-09-14: qty 10

## 2023-09-14 MED ORDER — SIMETHICONE 80 MG PO CHEW: 80.0000 mg | CHEWABLE_TABLET | ORAL | Status: DC | PRN

## 2023-09-14 MED ORDER — LIDOCAINE 2% (20 MG/ML) 5 ML SYRINGE
INTRAMUSCULAR | Status: AC
  Filled 2023-09-14: qty 5

## 2023-09-14 MED ORDER — ZOLPIDEM TARTRATE 5 MG PO TABS
5.0000 mg | ORAL_TABLET | Freq: Every evening | ORAL | Status: DC | PRN
  Administered 2023-09-14: 5 mg via ORAL
  Filled 2023-09-14: qty 1

## 2023-09-14 MED ORDER — SUCCINYLCHOLINE CHLORIDE 200 MG/10ML IV SOSY
PREFILLED_SYRINGE | INTRAVENOUS | Status: DC | PRN
  Administered 2023-09-14: 120 mg via INTRAVENOUS

## 2023-09-14 MED ORDER — DEXAMETHASONE SODIUM PHOSPHATE 10 MG/ML IJ SOLN
INTRAMUSCULAR | Status: DC | PRN
  Administered 2023-09-14: 10 mg via INTRAVENOUS

## 2023-09-14 MED ORDER — OXYCODONE-ACETAMINOPHEN 5-325 MG PO TABS
2.0000 | ORAL_TABLET | ORAL | Status: DC | PRN
  Filled 2023-09-14: qty 2

## 2023-09-14 MED ORDER — SODIUM CHLORIDE 0.9 % IV SOLN: 12.5000 mg | INTRAVENOUS | Status: DC | PRN

## 2023-09-14 MED ORDER — PROPOFOL 10 MG/ML IV BOLUS
INTRAVENOUS | Status: DC | PRN
  Administered 2023-09-14: 200 mg via INTRAVENOUS

## 2023-09-14 MED ORDER — FENTANYL CITRATE (PF) 100 MCG/2ML IJ SOLN: 25.0000 ug | INTRAMUSCULAR | Status: DC | PRN

## 2023-09-14 MED ORDER — SODIUM CHLORIDE 0.9 % IV SOLN
500.0000 mg | Freq: Once | INTRAVENOUS | Status: AC
  Administered 2023-09-15: 500 mg via INTRAVENOUS
  Filled 2023-09-14: qty 25

## 2023-09-14 MED ORDER — FENTANYL CITRATE (PF) 250 MCG/5ML IJ SOLN
INTRAMUSCULAR | Status: AC
  Filled 2023-09-14: qty 5

## 2023-09-14 MED ORDER — DIBUCAINE (PERIANAL) 1 % EX OINT: 1.0000 | TOPICAL_OINTMENT | CUTANEOUS | Status: DC | PRN

## 2023-09-14 MED ORDER — IRON SUCROSE 400 MG IVPB - SIMPLE MED: 400.0000 mg | Freq: Once | Status: DC

## 2023-09-14 MED ORDER — SENNOSIDES-DOCUSATE SODIUM 8.6-50 MG PO TABS
2.0000 | ORAL_TABLET | Freq: Every day | ORAL | Status: DC
  Administered 2023-09-15: 2 via ORAL
  Filled 2023-09-14: qty 2

## 2023-09-14 MED ORDER — OXYCODONE HCL 5 MG/5ML PO SOLN: 5.0000 mg | Freq: Once | ORAL | Status: DC | PRN

## 2023-09-14 MED ORDER — CEFAZOLIN SODIUM-DEXTROSE 2-4 GM/100ML-% IV SOLN
2.0000 g | Freq: Once | INTRAVENOUS | Status: AC
  Administered 2023-09-14: 2 g via INTRAVENOUS
  Filled 2023-09-14: qty 100

## 2023-09-14 MED ORDER — KETOROLAC TROMETHAMINE 30 MG/ML IJ SOLN: INTRAMUSCULAR | Status: DC | PRN

## 2023-09-14 MED ORDER — IRON SUCROSE 500 MG IVPB - SIMPLE MED
500.0000 mg | Freq: Once | INTRAVENOUS | Status: DC
  Filled 2023-09-14: qty 275

## 2023-09-14 MED ORDER — COCONUT OIL OIL: 1.0000 | TOPICAL_OIL | Status: DC | PRN

## 2023-09-14 MED ORDER — OXYTOCIN-SODIUM CHLORIDE 30-0.9 UT/500ML-% IV SOLN
INTRAVENOUS | Status: DC | PRN
  Administered 2023-09-14: 100 mL via INTRAVENOUS

## 2023-09-14 SURGICAL SUPPLY — 20 items
CATH ROBINSON RED A/P 16FR (CATHETERS) ×1 IMPLANT
CLOTH BEACON ORANGE TIMEOUT ST (SAFETY) ×1 IMPLANT
DECANTER SPIKE VIAL GLASS SM (MISCELLANEOUS) ×1 IMPLANT
GLOVE BIOGEL PI IND STRL 7.0 (GLOVE) ×1 IMPLANT
GLOVE BIOGEL PI IND STRL 7.5 (GLOVE) ×1 IMPLANT
GLOVE ECLIPSE 7.5 STRL STRAW (GLOVE) ×1 IMPLANT
GOWN STRL REUS W/TWL LRG LVL3 (GOWN DISPOSABLE) ×2 IMPLANT
KIT BERKELEY 1ST TRIMESTER 3/8 (MISCELLANEOUS) ×1 IMPLANT
MAT PREVALON FULL STRYKER (MISCELLANEOUS) IMPLANT
NS IRRIG 1000ML POUR BTL (IV SOLUTION) ×1 IMPLANT
PACK VAGINAL MINOR WOMEN LF (CUSTOM PROCEDURE TRAY) ×1 IMPLANT
PAD OB MATERNITY 4.3X12.25 (PERSONAL CARE ITEMS) ×1 IMPLANT
PAD PREP 24X48 CUFFED NSTRL (MISCELLANEOUS) ×1 IMPLANT
SET BERKELEY SUCTION TUBING (SUCTIONS) ×1 IMPLANT
TOWEL OR 17X24 6PK STRL BLUE (TOWEL DISPOSABLE) ×2 IMPLANT
VACURETTE 10 RIGID CVD (CANNULA) IMPLANT
VACURETTE 16MM ASPIR CVD .5 (CANNULA) IMPLANT
VACURETTE 7MM CVD STRL WRAP (CANNULA) IMPLANT
VACURETTE 8 RIGID CVD (CANNULA) IMPLANT
VACURETTE 9 RIGID CVD (CANNULA) IMPLANT

## 2023-09-14 NOTE — Anesthesia Preprocedure Evaluation (Signed)
 Anesthesia Evaluation  Patient identified by MRN, date of birth, ID band Patient awake    Reviewed: Allergy & Precautions, NPO status , Patient's Chart, lab work & pertinent test results  History of Anesthesia Complications (+) PONV and history of anesthetic complications  Airway Mallampati: I  TM Distance: >3 FB Neck ROM: Full    Dental  (+) Dental Advisory Given Braces :   Pulmonary neg pulmonary ROS   Pulmonary exam normal        Cardiovascular hypertension, Normal cardiovascular exam     Neuro/Psych  PSYCHIATRIC DISORDERS Anxiety Depression    negative neurological ROS     GI/Hepatic Neg liver ROS,GERD  Controlled and Medicated,,  Endo/Other   Obesity K 3.1   Renal/GU negative Renal ROS     Musculoskeletal negative musculoskeletal ROS (+)    Abdominal   Peds  Hematology negative hematology ROS (+)   Anesthesia Other Findings   Reproductive/Obstetrics  Prev c/s Fetal demise s/p vaginal delivery, now with retained placenta                               Anesthesia Physical Anesthesia Plan  ASA: 2  Anesthesia Plan: General   Post-op Pain Management: Toradol IV (intra-op)* and Tylenol  PO (pre-op)*   Induction: Intravenous, Rapid sequence and Cricoid pressure planned  PONV Risk Score and Plan: 4 or greater and Treatment may vary due to age or medical condition, Ondansetron , Dexamethasone  and Midazolam   Airway Management Planned: Oral ETT  Additional Equipment: None  Intra-op Plan:   Post-operative Plan: Extubation in OR  Informed Consent: I have reviewed the patients History and Physical, chart, labs and discussed the procedure including the risks, benefits and alternatives for the proposed anesthesia with the patient or authorized representative who has indicated his/her understanding and acceptance.     Dental advisory given  Plan Discussed with: CRNA and  Anesthesiologist  Anesthesia Plan Comments:          Anesthesia Quick Evaluation

## 2023-09-14 NOTE — Anesthesia Procedure Notes (Signed)
 Procedure Name: Intubation Date/Time: 09/14/2023 8:18 AM  Performed by: Tidwell, Tess, RNPre-anesthesia Checklist: Patient identified, Patient being monitored, Timeout performed, Emergency Drugs available and Suction available Patient Re-evaluated:Patient Re-evaluated prior to induction Oxygen Delivery Method: Circle System Utilized Preoxygenation: Pre-oxygenation with 100% oxygen Induction Type: IV induction, Rapid sequence and Cricoid Pressure applied Laryngoscope Size: Glidescope and 3 Grade View: Grade I Tube type: Oral Tube size: 7.0 mm Number of attempts: 1 Airway Equipment and Method: stylet and Video-laryngoscopy Placement Confirmation: ETT inserted through vocal cords under direct vision, positive ETCO2 and breath sounds checked- equal and bilateral Secured at: 21 cm Tube secured with: Tape Dental Injury: Teeth and Oropharynx as per pre-operative assessment

## 2023-09-14 NOTE — Progress Notes (Addendum)
 POSTPARTUM PROGRESS NOTE  Patient Name: Haley Kramer, female   DOB: May 08, 1986, 38 y.o.  MRN: [id number]  Patient felt urge to push 0630, and I presented back to room. Able to palpate ~half of placenta in vaginal, but placenta did not deliver. Discussed options with patient including pelvic exam with attempt at extraction at bedside vs directly for [place]. Patient amenable to bedside attempt and pretreated with fentanyl  IV. Dr. Daisey Dryer in room as well.  Patient placed in stirrups. Speculum inserted. Able to visualize large portion of placenta coming through cervical os. Clots/blood cleared with fox swabs. Ring forceps used to attempt to tease out remainder of placenta by myself and then by Dr. Daisey Dryer. Ultimately unsuccessful. Anesthesia offered to give nitrous IV to help facilitate, and patient was amenable to final bedside attempt. Speculum replaced, and placenta again attempted to be teased out. Several fragments removed but not entirety of placenta. Patient continued to have bleeding and decision made to move to [facility] in OR.  Rhogam ordered given RH neg.  QBL 953  Maud Sorenson, MD

## 2023-09-14 NOTE — Op Note (Signed)
 [facility] PROCEDURE DATE: 09/14/2023  PREOPERATIVE DIAGNOSIS: retailed placenta after delivery. POSTOPERATIVE DIAGNOSIS: The same. PROCEDURE:     Dilation and Evacuation under ultrasound guidance SURGEON:  Dr. Cathyann Cobia  INDICATIONS: 38 y.o. [id number] with delivery of IUFD at [redacted] weeks gestation with retained placenta, needing surgical removal.  Risks of surgery were discussed with the patient including but not limited to: bleeding which may require transfusion; infection which may require antibiotics; injury to uterus or surrounding organs;need for additional procedures including laparotomy or laparoscopy; possibility of intrauterine scarring which may impair future fertility; and other postoperative/anesthesia complications. Written informed consent was obtained.    FINDINGS:  A 21 week sized uterus with retained placenta  ANESTHESIA:    Monitored intravenous sedation, paracervical block. INTRAVENOUS FLUIDS:  1150 ml of LR, 250 albumin  URINE OUTPUT: ESTIMATED BLOOD LOSS:  630 mL. SPECIMENS:  Products of conception sent to pathology COMPLICATIONS:  None immediate.  PROCEDURE DETAILS:  The patient received intravenous antibiotics while in the preoperative area.  She was then taken to the operating room where general anesthesia was administered and was found to be adequate.  After an adequate timeout was performed, she was placed in the dorsal lithotomy position and examined; then prepped and draped in the sterile manner.   Her bladder was catheterized with clear, yellow urine. A vaginal speculum was then placed in the patient's vagina.  A single tooth tenaculum was then applied to the anterior lip of the cervix. The placenta was noted at the cervical os. It was grasped with a ring forcep and the placenta was gently teased out. The ultrasound showed a moderate amount of vascular tissue along the endometrium. A large suction curette was placed to the fundus. The suction device was then  activated and curette slowly rotated to clear the uterus of products of conception. This was done an additional time. A sharp curettage with good cry was then performed to confirm complete emptying of the uterus. The suction curette was reintroduced and the suction was activated, rotated, and slowly withdrawn with no additional tissue. There was minimal bleeding noted and the tenaculum removed with good hemostasis noted.  The patient tolerated the procedure well.  The patient was taken to the recovery area in stable condition.   Garik Diamant J, DO 09/14/2023 9:10 AM

## 2023-09-14 NOTE — Progress Notes (Signed)
 Chaplain responded to [facility] consult. Haley Kramer was in her hospital bed accompanied by her partner Haley Kramer. They had just said goodbye to baby Haley Kramer who they delivered early this morning after learning of his intrauterine fetal demise. Chaplain introduced spiritual care and offered support.  Chaplain asked open ended questions to facilitate emotional expression and story telling. [name] shared that she is struggling with questions of why and feeling far removed from God. Chaplain normalized these feelings and the longing to better understand despite the awareness that no answer would suffice as an adequate reason to deliver a baby that is not alive.   [name] and Haley Kramer also shared about the challenges of every day life as they have an 19 month old son, Haley Kramer, who requires significant medical support and nursing assistance and a 58 year old daughter, Haley Kramer who was eagerly awaiting her baby brother.   [name] shared that after Haley Kramer's birth she came down with a significant infection and has decided to stay in the hospital to ensure that she gets good care given the intensity of the care that Haley Kramer requires at home and her limited ability to assist in that.   Chaplain provided grief education and support and will continue to follow.  Please page as further needs arise.  [name]. Davee Lomax, M.Div. [facility] Chaplain Pager [phone] Office [phone]

## 2023-09-14 NOTE — Progress Notes (Signed)
 CSW met with MOB at bedside to address consult for (IUFD and assistance with funeral arrangements). CSW introduced self and explained role/reason for consult. CSW offered condolences for MOB's loss, MOB thanked CSW. MOB was welcoming, open, and remained engaged during assessment. MOB reported that she resides between the address listed on her facesheet and FOB's residence [facility] [address] [place], [place] [address]). 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 MOB reported that she desires to work and explained the pediatric private duty nursing care has been unreliable. CSW asked if MOB needed pediatric private duty nursing resources, MOB declined and shared that she is on a waiting list for all of the local agencies. MOB reported that she receives both [facility] and food stamps.   CSW inquired about MOB's request for assistance with funeral arrangements. MOB shared that she has never been through anything like this before and she doesn't know what to do. CSW normalized this being MOB's first experience with something like this. CSW explained the process of contacting a funeral home and provided a local funeral list. CSW explained that MOB will notify patient placement if she makes the decision post discharge, MOB verbalized understanding. CSW provided MOB with a list of agencies that provide financial assistance for funerals/burials. CSW and MOB discussed loss of infant. MOB shared that she named her son Sammie Crigler. CSW and MOB discussed grief and the grief cycle. MOB identified that she currently angry. CSW normalized MOB's angry feelings and encouraged MOB to feel her feelings as they arise as they are valid. MOB referenced her faith. CSW and MOB discussed MOB utilizing faith and prayer as healthy coping skills.   CSW and MOB discussed multiple stressors impacting MOB's mental health. MOB reported financial strain due to her inability to work as she is the  primary caregiver for her son and being the primary caregiver for her son with limited support. CSW and MOB discussed MOB's experience being a primary caregiver for a child with special needs. MOB shared that it's a lonely life and noted that others have isolated themselves from her due to her child special needs. CSW inquired about MOB's participation/interest in support groups to build a sense of community outside of herself and FOB. MOB reported that she wanted to do support groups in the past but was unable to make it due to having no one available to provide childcare. CSW inquired about MOB's support system. MOB reported that she has good supports to talk to but no supports to be hands on with assisting with care for her son. MOB reported that FOB is the only caregiver for her son. MOB mentioned that grandpa is trained to care for her son but he doesn't come often.   CSW and MOB discussed MOB's mental health history. MOB endorsed a history of anxiety and depression and shared that she was diagnosed in 2023 after her son was born. MOB endorsed current depressive symptoms and described feeling sad, feeling like a failure, fatigue, feeling hopeless, feeling drained, isolating, and loneliness. MOB reported that she is not currently taking medication nor participating in therapy. MOB reported that she took medication during the pregnancy for approximately 2-3 days and had to discontinue use as it made her drowsy and she needed to care for her son. MOB reported that she is interested in therapy and mentioned having a referral to IBH at [facility]. CSW agreed to provide MOB with local mental health resources and encouraged  MOB to follow up with resources. CSW inquired about how MOB was feeling emotionally since giving birth, MOB reported that she was feeling stressed, mad, and sad. MOB endorsed initially having passive suicidal thoughts and specified that she is not currently having any SI. CSW acknowledged,  normalized, and validated MOB's feelings of stress, anger, and sadness. CSW and MOB discussed the impact of MOB experiencing multiple stressors including the loss of infant. CSW and MOB discussed the importance of MOB caring for herself during this difficult time. CSW and MOB discussed coping skills and connecting with community agencies for additional support. CSW informed MOB about the [employer] Program, MOB reported that she was interested and agreeable to a referral. CSW informed MOB about [facility] Disability Support Services, MOB reported that she was interested and agreeable to a referral. CSW assessed for safety, MOB denied SI, HI, and domestic violence.   CSW provided education regarding the baby blues period vs. perinatal mood disorders, discussed treatment and gave resources for mental health follow up if concerns arise.  CSW recommends self-evaluation during the postpartum time period using the New Mom Checklist from Postpartum Progress and encouraged MOB to contact a medical professional if symptoms are noted at any time.   CSW inquired about resources/supports that would be helpful for MOB. MOB reported that food pantries that provide fresh fruits/vegetables, information about local diaper banks, assistance with everyday essentials (toiletries). CSW agreed to return with requested resources.   CSW returned to room, MOB accompanied by a female guest. CSW greeted both parties and went over requested resources Administrator, arts, Resources for [employer], [employer], [employer]) MOB thanked CSW.   CSW provided MOB with diapers, wipes, and pads as a resource from [employer]. MOB thanked CSW.   MOB denied any additional needs and thanked CSW for support provided.   CSW completed [facility] referral. CSW completed [facility] referral.   CSW identifies no further need for intervention and no barriers to discharge at  this time.  Bobbye Burrow, LCSW Clinical Social Worker [facility] Cell#: [phone]

## 2023-09-14 NOTE — Transfer of Care (Signed)
 Immediate Anesthesia Transfer of Care Note  Patient: Haley Kramer  Procedure(s) Performed: DILATION AND EVACUATION, UTERUS (Bilateral)  Patient Location: PACU  Anesthesia Type:General  Level of Consciousness: awake, alert , and oriented  Airway & Oxygen Therapy: Patient Spontanous Breathing  Post-op Assessment: Report given to RN and Post -op Vital signs reviewed and stable  Post vital signs: Reviewed and stable  Last Vitals:  Vitals Value Taken Time  BP 117/73 09/14/23 0920  Temp    Pulse 86 09/14/23 0928  Resp 29 09/14/23 0928  SpO2 99 % 09/14/23 0928  Vitals shown include unfiled device data.  Last Pain:  Vitals:   09/14/23 0640  TempSrc:   PainSc: 4          Complications: No notable events documented.

## 2023-09-14 NOTE — Anesthesia Postprocedure Evaluation (Signed)
 Anesthesia Post Note  Patient: Haley Kramer  Procedure(s) Performed: DILATION AND EVACUATION, UTERUS (Bilateral)     Patient location during evaluation: PACU Anesthesia Type: General Level of consciousness: awake and alert Pain management: pain level controlled Vital Signs Assessment: post-procedure vital signs reviewed and stable Respiratory status: spontaneous breathing, nonlabored ventilation and respiratory function stable Cardiovascular status: stable and blood pressure returned to baseline Anesthetic complications: no   No notable events documented.  Last Vitals:  Vitals:   09/14/23 1015 09/14/23 1031  BP: 112/62 111/61  Pulse: 89 89  Resp: (!) 27 (!) 21  Temp:    SpO2: 97% 97%    Last Pain:  Vitals:   09/14/23 1031  TempSrc:   PainSc: Asleep   Pain Goal:                   Juventino Oppenheim

## 2023-09-14 NOTE — Progress Notes (Signed)
 LABOR PROGRESS NOTE  Patient Name: Haley Kramer, female   DOB: 04/17/1986, 38 y.o.  MRN: [id number]  Called by RN at last check. Previous cytotec  pills still in the vagina and barely dissolved. Next placed after crushing and found to be dissolving on recheck ~30 minutes later. Will trial buccal cytotec  next dose, as patient is now amenable to this.  Maud Sorenson, MD

## 2023-09-14 NOTE — Progress Notes (Signed)
 Patient ID: Haley Kramer, female   DOB: 06-21-1985, 38 y.o.   MRN: [id number]  Patient with retained placenta despite cytotec . Will take for D&E. Discuss risk of bleeding, infection, perforation, injury to abdominal organs.   Will plan on having ultrasound present for procedure.  Glessie Eustice J Nthony Lefferts, DO

## 2023-09-14 NOTE — Progress Notes (Signed)
 Bedside nitrous by Anesthesia team to assist with placenta extraction begun at this time.

## 2023-09-14 NOTE — Progress Notes (Signed)
 Dr Robertha China and Dr Daisey Dryer at bedside; manual removal of placenta in progress. Pt premedicated with Fentanyl .

## 2023-09-15 ENCOUNTER — Encounter: Payer: Self-pay | Admitting: Oncology

## 2023-09-15 ENCOUNTER — Other Ambulatory Visit (HOSPITAL_COMMUNITY): Payer: Self-pay

## 2023-09-15 ENCOUNTER — Other Ambulatory Visit: Payer: Self-pay | Admitting: Obstetrics and Gynecology

## 2023-09-15 DIAGNOSIS — O364XX0 Maternal care for intrauterine death, not applicable or unspecified: Secondary | ICD-10-CM

## 2023-09-15 DIAGNOSIS — F32A Depression, unspecified: Secondary | ICD-10-CM

## 2023-09-15 DIAGNOSIS — F4321 Adjustment disorder with depressed mood: Secondary | ICD-10-CM

## 2023-09-15 LAB — RH IG WORKUP (INCLUDES ABO/RH)
Gestational Age(Wks): 21
Unit division: 0

## 2023-09-15 MED ORDER — TRAZODONE HCL 50 MG PO TABS
50.0000 mg | ORAL_TABLET | Freq: Every evening | ORAL | 0 refills | Status: DC | PRN
  Filled 2023-09-15: qty 30, 30d supply, fill #0

## 2023-09-15 MED ORDER — ESCITALOPRAM OXALATE 10 MG PO TABS
ORAL_TABLET | ORAL | 0 refills | Status: DC
Start: 2023-09-15 — End: 2023-09-22
  Filled 2023-09-15: qty 86, 90d supply, fill #0

## 2023-09-15 MED ORDER — ESCITALOPRAM OXALATE 10 MG PO TABS
5.0000 mg | ORAL_TABLET | Freq: Every day | ORAL | Status: DC
  Administered 2023-09-15: 5 mg via ORAL
  Filled 2023-09-15: qty 1

## 2023-09-15 MED ORDER — IBUPROFEN 600 MG PO TABS
600.0000 mg | ORAL_TABLET | Freq: Four times a day (QID) | ORAL | 0 refills | Status: AC | PRN
  Filled 2023-09-15: qty 30, 8d supply, fill #0

## 2023-09-15 NOTE — Discharge Instructions (Addendum)
 Recommended vitamins after bariatric surgery (amount per day): - Vitamin A 10,000 IU  - Vitamin D  2,000-3,000 IU  - Vitamin E 15mg   - Vitamin K 300mcg   B vitamins (can sometimes get a B complex vitamin that covers all of these) - B1 50-100mg   - B12 350-1000mcg  - Folate (B9) 800-1000mcg  Minerals - Ferrous sulfate 325mcg every other day (iron ) - Zinc 16-22mg  - Copper 2mg  - Calcium 600-800mg  three times a day  Many multivitamins will have all of these in the right doses, but you need to check the label. Look for brands that are third party tested because they confirm the vitamins actually have what they're supposed to have in them. [employer] is one of the brands I like because it is relatively inexpensive and is third party tested.    [facility]  [address], [place], [place] [address] [phone] or [phone]  [facility] 24/7 FOR ANYONE  [address], [place], [place]  [phone]  Fax: [phone] [email or web address]  *Interpreters available  *Accepts all insurance and uninsured for Urgent Care needs  *Accepts Medicaid and uninsured for outpatient treatment (below)    ONLY FOR [facility]  Below:   Outpatient New Patient Assessment/Therapy Walk-ins:        Monday -Thursday 8am until slots are full.        Every Friday 1pm-4pm  (first come, first served)                    New Patient Psychiatry/Medication Management         Monday-Friday 8am-11am (first come, first served)              For all walk-ins we ask that you arrive by 7:15am, because patients will be seen in the order of arrival.

## 2023-09-15 NOTE — Progress Notes (Signed)
 Follow up visit with Signora in the setting of IUFD. Haley Kramer was lying in her pt bed holding baby Haley Kramer at the time of our visit. She reports feeling very tired, but shared that she took a sleep aid last night and got good rest. She is currently getting an Iron  infusion, which she stated also makes her tired sometimes.   Chaplain asked open ended questions to facilitate emotional expression and story telling. Haley Kramer reports feeling some worry about going home today. She states she wants to be able to take her medicine, but is aware that it makes her feel tired and that is a challenge as they currently have very few nights with nursing help and she needs to be alert in case her toddler son, Haley Kramer, pulls his trach out or has an oxygen reduction. She is trying to discern how to manage both priorities, but recognizes the importance of the medication for her mental health.   We explored ways that she might divide the evening with her husband, Haley Kramer, the hope that a [facility] case manager might be able to help with coordinating Haley Kramer's support needs and take some of the load off of her plate, and her intention to prioritize her mental health with full awareness that it will also benefit her child.  She is appropriately sad and worried about leaving today without Haley Kramer. We discussed grief cycles and I encouraged herself to recognize that there is not a timeline for grief. Provided some education on PMADS vs typical grief and the correlation of the two and encouraged her to reach out to her provider before her follow up visit if she noticed signs of worsening depression. She is aware of community grief resources including our hospital support group which will next meet on May 14th at 6:30.   Chaplain support remains available outpatient and Haley Kramer was informed about how to reach out.  Please page as further needs arise.  [name]. Davee Lomax, M.Div. [facility] Chaplain Pager [phone] Office  [phone]

## 2023-09-15 NOTE — Progress Notes (Signed)
 CSW received consult due to score 27 on Edinburgh Depression Screen. CSW followed up with MOB due to elevated edinburgh score. CSW and MOB discussed edinburgh score 27. MOB attributed elevated score to everything going on. CSW acknowledged and validated MOB's feelings surrounding her experience of losing her son. CSW acknowledged that MOB scored 3 (quite often) on question 10 (the thought of harming myself has occurred to me). MOB reported "when all of this happened, I felt like I would be better off not here". MOB reported that she was having passive suicidal thoughts and denied any plan/intent. MOB denied any current thoughts of SI. CSW inquired about how MOB was feeling emotionally today, MOB shared that she was feeling sad. CSW acknowledged, normalized, and validated MOB's feelings. CSW and MOB discussed utilization of healthy coping skills. CSW discussed the importance of rest, eating well balanced meals, getting sunlight, and taking some time for self even if it's for a few minutes. MOB verbalized understanding and reported that she plans to utilize food pantry resource to obtain fresh foods and noted that [facility] has been a helpful resource for getting fresh fruits and vegetables. MOB shared about restarting antidepressant (Lexapro ) while admitted and verbalized plan to continue use as she hopes it will be helpful. CSW positively affirmed MOB's efforts to treat mental health symptoms.   CSW inquired about MOB's interest for a referral to [facility] for mental health follow up due to elevated edinburgh score. MOB reported that she was interested and consented for CSW to make a referral. CSW agreed to complete a referral.   CSW inquired about MOB's interest in a [facility] referral to provide additional support in the community for her son's care needs, MOB reported that she was interest and consented for CSW to make a referral. CSW agreed to make a referral.  CSW provided education regarding Baby Blues vs  PMADs and provided MOB with resources for mental health follow up.  CSW encouraged MOB to evaluate her mental health throughout the postpartum period with the use of the New Mom Checklist developed by Postpartum Progress as well as the [place] Postnatal Depression Scale and notify a medical professional if symptoms arise. CSW encouraged MOB to pay close attention to symptoms due to elevated edinburgh score and multiple stressors.   CSW completed [facility] referral. CSW completed [facility] referral.   Patient requests a referral to Integrated Behavioral Health.  Patient verbalizes understanding that the appointment will be virtual.  Bobbye Burrow, LCSW Clinical Social Worker [facility] Cell#: [phone]

## 2023-09-15 NOTE — Discharge Summary (Signed)
 Postpartum Discharge Summary  Patient Name: Haley Kramer DOB: 1986-01-02 MRN: [id number]  Date of admission: 09/13/2023 Delivery date:09/14/2023 Delivering provider: Maud Sorenson Date of discharge: 09/15/2023  Admitting diagnosis: Fetal demise before 22 weeks with retention of dead fetus [O36.4XX0] Intrauterine pregnancy: [redacted]w[redacted]d     Secondary diagnosis:  Principal Problem:   IUFD at 20 weeks or more of gestation Active Problems:   Benign hypertension   Depression   Anemia in pregnancy   History of C-section   Rh negative status during pregnancy   History of bariatric surgery   SVD (spontaneous vaginal delivery)  Additional problems: As above    Discharge diagnosis:  IUFD, SVD                                               Post partum procedures:curettage for retained placenta Augmentation: Cytotec  Complications: IUFD, retained placenta  Hospital course: Induction of Labor With Vaginal Delivery   38 y.o. yo [id number] at [redacted]w[redacted]d was admitted to the hospital 09/13/2023 for induction of labor.  Indication for induction:  IUFD .   Membrane Rupture Time/Date: 5:00 AM,09/14/2023  Delivery Method:VBAC, Spontaneous Episiotomy: None Lacerations:  None Details of delivery can be found in separate delivery note.  Patient had a postpartum course complicated by severe depression. EDPS 27. She denied active suicidal ideation. She was evaluated by social work prior to discharge. She was started on lexapro  and trazodone. Integrated behavioral health referral placed and [facility] information was given. She will follow up at [facility] within 1 week for mood check.  She also had iron  deficiency anemia with Hgb 8.3. She received a dose of IV iron  due to history of malabsorptive gastric bypass.  Patient is discharged home 09/15/23.  Newborn Data: Birth date:09/14/2023 Birth time:5:06 AM Gender:Female Living status:Fetal Demise Apgars:0 ,0  Weight:451 g  Magnesium Sulfate  received: No BMZ received: No Rhophylac :No - received with amniocentesis MMR:N/A T-DaP: N/A Flu: No RSV Vaccine received: No Transfusion:No  Immunizations received: Immunization History  Administered Date(s) Administered   Influenza, Seasonal, Injecte, Preservative Fre 02/18/2011   Influenza,inj,Quad PF,6+ Mos 09/01/2018, 04/23/2021   Janssen (J&J) SARS-COV-2 Vaccination 08/18/2019   Moderna Sars-Covid-2 Vaccination 04/17/2020   PPD Test 02/25/2019, 02/20/2020, 11/04/2022   Tdap 01/31/2011, 09/16/2021    Physical exam  Vitals:   09/14/23 1930 09/14/23 2317 09/15/23 0450 09/15/23 0733  BP: (!) 114/55 108/61 (!) 102/55 (!) 113/59  Pulse: 94 92 99 97  Resp: 16 17 17 18   Temp: 98.5 F (36.9 C) 98.5 F (36.9 C)  98.4 F (36.9 C)  TempSrc: Oral Oral Oral Oral  SpO2: 98% 99% 98% 99%  Weight:      Height:       General: alert, cooperative, and no distress Lochia: appropriate Uterine Fundus: non palpable DVT Evaluation: No evidence of DVT seen on physical exam.  Labs: Lab Results  Component Value Date   WBC 18.0 (H) 09/14/2023   HGB 8.5 (L) 09/14/2023   HCT 25.0 (L) 09/14/2023   MCV 95.8 09/14/2023   PLT 148 (L) 09/14/2023      Latest Ref Rng & Units 09/13/2023    2:20 PM  CMP  Glucose 70 - 99 mg/dL 87   BUN 6 - 20 mg/dL 5   Creatinine 3.08 - 6.57 mg/dL 8.46   Sodium 962 - 952  mmol/L 135   Potassium 3.5 - 5.1 mmol/L 3.1   Chloride 98 - 111 mmol/L 110   CO2 22 - 32 mmol/L 17   Calcium 8.9 - 10.3 mg/dL 8.5   Total Protein 6.5 - 8.1 g/dL 6.2   Total Bilirubin 0.0 - 1.2 mg/dL 0.6   Alkaline Phos 38 - 126 U/L 69   AST 15 - 41 U/L 59   ALT 0 - 44 U/L 68    Edinburgh Score:    09/15/2023    7:09 AM  Edinburgh Postnatal Depression Scale Screening Tool  I have been able to laugh and see the funny side of things. 2  I have looked forward with enjoyment to things. 3  I have blamed myself unnecessarily when things went wrong. 3  I have been anxious or worried for no  good reason. 2  I have felt scared or panicky for no good reason. 2  Things have been getting on top of me. 3  I have been so unhappy that I have had difficulty sleeping. 3  I have felt sad or miserable. 3  I have been so unhappy that I have been crying. 3  The thought of harming myself has occurred to me. 3  Edinburgh Postnatal Depression Scale Total 27   Edinburgh Postnatal Depression Scale Total: (!) 27   After visit meds:  Allergies as of 09/15/2023   No Known Allergies      Medication List     STOP taking these medications    doxylamine  (Sleep) 25 MG tablet Commonly known as: UNISOM    Doxylamine -Pyridoxine  10-10 MG Tbec Commonly known as: Diclegis    hydrOXYzine  25 MG capsule Commonly known as: Vistaril    Prenate Pixie  10-0.6-0.4-200 MG Caps   promethazine  25 MG tablet Commonly known as: PHENERGAN        TAKE these medications    acetaminophen  500 MG tablet Commonly known as: TYLENOL  Take 2 tablets (1,000 mg total) by mouth every 6 (six) hours as needed for moderate pain, fever, headache or mild pain.   escitalopram  10 MG tablet Commonly known as: LEXAPRO  Take 0.5 tablets (5 mg total) by mouth daily for 8 days, THEN 1 tablet (10 mg total) daily. Start taking on: Sep 15, 2023 What changed: See the new instructions.   ibuprofen  600 MG tablet Commonly known as: ADVIL  Take 1 tablet (600 mg total) by mouth every 6 (six) hours as needed for cramping.   traZODone 50 MG tablet Commonly known as: DESYREL Take 1 tablet (50 mg total) by mouth at bedtime as needed for sleep.         Discharge home in stable condition Infant Disposition:morgue/funeral home Discharge instruction: per After Visit Summary and Postpartum booklet. Activity: Advance as tolerated. Pelvic rest for 6 weeks.  Diet: routine diet Future Appointments: Future Appointments  Date Time Provider Department Center  09/24/2023  8:35 AM Abigail Abler, MD [facility] None  11/09/2023  3:00 PM  [facility] None  11/09/2023  3:15 PM Avonne Boettcher, MD [facility] None   Follow up Visit:  Follow-up Information     [facility]. Go on 09/24/2023.   Specialty: Obstetrics and Gynecology Why: Please keep your scheduled appointment with Dr. Annis Kinder information: [address], [address] [place]  [address] [phone]                 Please schedule this patient for a In person postpartum visit in 1  week with the following provider: Any provider. Additional Postpartum F/U:Postpartum Depression checkup  High risk pregnancy Delivery mode:  VBAC, Spontaneous Anticipated Birth Control:  Unsure  09/15/2023 Izell Marsh, MD

## 2023-09-15 NOTE — Progress Notes (Signed)
 Discharge instruction and prescriptions given to pt. Discussed post vaginal delivery care, signs and symptoms to report to the MD, upcoming appointments, and meds. Pt verbalizes understanding and has no questions or concerns at this time. Pt discharged home from hospital in stable condition.

## 2023-09-15 NOTE — Plan of Care (Signed)
  Problem: Education: Goal: Knowledge of General Education information will improve Description: Including pain rating scale, medication(s)/side effects and non-pharmacologic comfort measures Outcome: Progressing   Problem: Health Behavior/Discharge Planning: Goal: Ability to manage health-related needs will improve Outcome: Progressing   Problem: Clinical Measurements: Goal: Will remain free from infection Outcome: Progressing   Problem: Activity: Goal: Risk for activity intolerance will decrease Outcome: Progressing   Problem: Nutrition: Goal: Adequate nutrition will be maintained Outcome: Progressing   Problem: Coping: Goal: Level of anxiety will decrease Outcome: Progressing   Problem: Elimination: Goal: Will not experience complications related to urinary retention Outcome: Progressing   Problem: Pain Managment: Goal: General experience of comfort will improve and/or be controlled Outcome: Progressing   Problem: Safety: Goal: Ability to remain free from injury will improve Outcome: Progressing

## 2023-09-16 LAB — SURGICAL PATHOLOGY

## 2023-09-16 LAB — MCC TRACKING

## 2023-09-17 ENCOUNTER — Telehealth (HOSPITAL_COMMUNITY): Payer: Self-pay | Admitting: *Deleted

## 2023-09-17 DIAGNOSIS — Z1331 Encounter for screening for depression: Secondary | ICD-10-CM

## 2023-09-17 LAB — TYPE AND SCREEN
ABO/RH(D): O NEG
Antibody Screen: POSITIVE
Unit division: 0
Unit division: 0

## 2023-09-17 LAB — BPAM RBC
Blood Product Expiration Date: 202505172359
Blood Product Expiration Date: 202505172359
ISSUE DATE / TIME: 202505020143
Unit Type and Rh: 9500
Unit Type and Rh: 9500

## 2023-09-17 NOTE — Telephone Encounter (Signed)
 Patients scored 27 on the EPDS in the hospital.  Answer to question ten was "3-Yes, Quite Often".  Placed order for [facility] referral. Spoke to Dr. Shira Dopp via Benna Brasher about above information.   Dr. Shira Dopp requested that RN call to check on patient today. Called and spoke to patient. Patient said that she is doing OK.  Her only concern is difficulty sleeping.  She has been taking the trazodone that was prescribed at discharge, but it has not been working.  She said that Ambien  worked well for her in the hospital and she would like a short-term prescription for Ambien .  She has had a few headaches which she attributes to not getting adequate sleep. Denies history of elevated blood pressure.  She says that emotionally she is coping OK and that she has good family support.  She has a nurse office visit scheduled for next week.  Told patient that I would notify OB of her request for a different sleep aid.  Also told patient that if she had any thoughts of harming herself that she should go to the nearest ER.  Patient verbalized understanding and restated that she thought she would feel much better if she could sleep better.

## 2023-09-18 ENCOUNTER — Telehealth: Payer: Self-pay

## 2023-09-18 ENCOUNTER — Other Ambulatory Visit: Payer: Self-pay

## 2023-09-18 ENCOUNTER — Telehealth: Admitting: Family Medicine

## 2023-09-18 ENCOUNTER — Other Ambulatory Visit: Payer: Self-pay | Admitting: Obstetrics & Gynecology

## 2023-09-18 ENCOUNTER — Encounter: Payer: Self-pay | Admitting: Family Medicine

## 2023-09-18 DIAGNOSIS — F5102 Adjustment insomnia: Secondary | ICD-10-CM

## 2023-09-18 DIAGNOSIS — G479 Sleep disorder, unspecified: Secondary | ICD-10-CM

## 2023-09-18 DIAGNOSIS — O039 Complete or unspecified spontaneous abortion without complication: Secondary | ICD-10-CM

## 2023-09-18 MED ORDER — ZOLPIDEM TARTRATE 5 MG PO TABS: 5.0000 mg | ORAL_TABLET | Freq: Every evening | ORAL | 1 refills | Status: DC | PRN

## 2023-09-18 MED ORDER — ZOLPIDEM TARTRATE 5 MG PO TABS
5.0000 mg | ORAL_TABLET | Freq: Every evening | ORAL | 0 refills | Status: DC | PRN
Start: 2023-09-18 — End: 2023-09-18

## 2023-09-18 MED ORDER — ZOLPIDEM TARTRATE 5 MG PO TABS: 5.0000 mg | ORAL_TABLET | Freq: Every evening | ORAL | 0 refills | Status: DC | PRN

## 2023-09-18 NOTE — Progress Notes (Signed)
    Faculty Practice OB/GYN Physician Phone Call Documentation  I had a phone conversation with our nursing service about Haley Kramer  and her insomnia, s/p recent delivery of IUFD at 21 weeks.  Not taking Trazodone  as this not help, Ambien  is the only thing that helped her sleep and she is having problems sleeping.  Appropriately grieving, no thoughts of SI/HI, has appropriate support at home.  Ambien  5 mg po at bedtime prn insomnia prescribed for patient.   Lenoard Rad, MD, FACOG Obstetrician & Gynecologist, [facility] for [employer], [facility]

## 2023-09-18 NOTE — Progress Notes (Signed)
 Per Dr. Willey Harrier pt was sent home with Trazadone, so cannot have rx for Ambien  if pt has Trazadone. Pt reports the Trazadone did not help, so she is not taking that. Ambien  was the only thing that seemed to help in hospital for sleep. Had virtual visit with PCP this AM who advised to contact us  for sleep aid.

## 2023-09-18 NOTE — Progress Notes (Signed)
 [place]  Patient had a TOC call given an elevated score on EPDS. Dr. Shira Dopp had requested a call to check on the patient yesterday. Patient reports she spoke with a RN- note is in [facility] for review.  Today she made a VUC appt because she is tired, as reported, she is having difficulty sleeping. She was given Ambien  in the hospital and reported that worked well for her and would like a short term prescription for it, if possible.   She was provided with trazodone  at discharge- this is not working. She still reports headaches, secondary to lack of sleep. Denies SI, HI, elevated BP, or chest pain, leg swelling. She is just wanting some good sleep after everything she has been through. Continues to endorse good family support.  Reviewed side effects, risks and benefits of medication- in addition that we can not provide sleep aides in the virtual UC setting, as these are medications that require follow appts and monitoring.    I discussed that the RN has made a note and the OB is aware- but if she wanted to follow up she could call the office directly to make sure. Advised that it was just reported yesterday afternoon, and it is still early so she might hear from them in a few hours once providers have had time to review.  Patient acknowledged agreement and understanding of the plan.   Past Medical, Surgical, Social History, Allergies, and Medications have been Reviewed.  Lanetta Pion, DNP, AGNP-BC [facility]- Virtual Care  [place]

## 2023-09-18 NOTE — Progress Notes (Deleted)
 Ambien  rx sent per Dr. Willey Harrier

## 2023-09-18 NOTE — Telephone Encounter (Signed)
 TC from pt regarding trouble sleeping since being home from hospital. States ambien  prescription worked well for her. Trazadone does not help. Message sent to Dr. Willey Harrier, provider in office today, about short term prescription for Ambien  for pt.

## 2023-09-19 ENCOUNTER — Telehealth: Payer: Self-pay | Admitting: Obstetrics and Gynecology

## 2023-09-19 ENCOUNTER — Other Ambulatory Visit: Payer: Self-pay | Admitting: Obstetrics and Gynecology

## 2023-09-19 MED ORDER — ZOLPIDEM TARTRATE 5 MG PO TABS: 5.0000 mg | ORAL_TABLET | Freq: Every evening | ORAL | 1 refills | Status: DC | PRN

## 2023-09-19 NOTE — Progress Notes (Signed)
 Ambien  sent to pharmacy per patient request for inability to sleep.

## 2023-09-19 NOTE — Telephone Encounter (Signed)
 Called patient to inform her that Ambien  had been sent to pharmacy, she states she was not doing well as she had headache and was not sleeping and needed something to sleep. Reassured her that I would call pharmacy to ensure it has been received, called pharmacy and they confirmed they had received order for ambien .   K. Andi Kaufmann, MD, [facility] Attending Center for [facility])

## 2023-09-21 ENCOUNTER — Telehealth: Payer: Self-pay

## 2023-09-21 ENCOUNTER — Ambulatory Visit (INDEPENDENT_AMBULATORY_CARE_PROVIDER_SITE_OTHER): Admitting: Licensed Clinical Social Worker

## 2023-09-21 DIAGNOSIS — F5102 Adjustment insomnia: Secondary | ICD-10-CM | POA: Diagnosis not present

## 2023-09-21 DIAGNOSIS — F4323 Adjustment disorder with mixed anxiety and depressed mood: Secondary | ICD-10-CM

## 2023-09-21 NOTE — Telephone Encounter (Signed)
 I spoke with the patient to return the results from her amniocentesis procedure.  Karyotype was 2, XY, consistent with a female fetus with no aneuploidies or chromosomal structural rearrangements.  Variantyx trio whole genome sequencing returned as negative. No copy number variants or pathogenic variants were identified. We reviewed that this does not exclude a genetic diagnosis in the fetus, as all genetic tests have limitations. We reviewed the ACAN variant of uncertain significance that was noted in Haley Kramer's son Haley Kramer was not detected in the fetal sample. 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  AF-AFP, toxoplasmosis, and CMV studies were wnl.  Haley Kramer is still grieving after the loss of her son, Haley Kramer. She reports a good support system.  Georgean Kindle, MS, [facility] Certified Genetic Counselor [facility] [phone]

## 2023-09-21 NOTE — BH Specialist Note (Unsigned)
 Integrated Behavioral Health via Telemedicine Visit  09/22/2023 Haley Kramer [id number]  Number of Integrated Behavioral Health Clinician visits: 1- Initial Visit  Session Start time: 0813   Session End time: 0851  Total time in minutes: 38   Referring Provider: Dr. Donetta Furl Patient/Family location: At Haley Kramer [facility] Provider location: Remote Office All persons participating in visit: Patient and [facility] Types of Service: Individual psychotherapy and Video visit  I connected with Haley Kramer and/or Haley Kramer's patient via  Telephone or Video Enabled Telemedicine Application  (Video is Caregility application) and verified that I am speaking with the correct person using two identifiers. Discussed confidentiality: Yes   I discussed the limitations of telemedicine and the availability of in person appointments.  Discussed there is a possibility of technology failure and discussed alternative modes of communication if that failure occurs.  I discussed that engaging in this telemedicine visit, they consent to the provision of behavioral healthcare and the services will be billed under their insurance.  Patient and/or legal guardian expressed understanding and consented to Telemedicine visit: Yes   Presenting Concerns: Patient and/or family reports the following symptoms/concerns: increased symptoms of depression and anxiety stemming from recent loss.  Duration of problem: Weeks; Severity of problem: moderate  Patient and/or Family's Strengths/Protective Factors: Concrete supports in place (healthy food, safe environments, etc.) and Physical Health (exercise, healthy diet, medication compliance, etc.)  Goals Addressed: Patient will:  Reduce symptoms of: anxiety and depression   Increase knowledge and/or ability of: coping skills, healthy habits, and self-management skills   Demonstrate ability to: Increase healthy adjustment to current life circumstances, Increase adequate  support systems for patient/family, and Begin healthy grieving over loss  Progress towards Goals: Ongoing  Interventions: Interventions utilized:  Medication Monitoring, Supportive Counseling, Psychoeducation and/or Health Education, Supportive Reflection, and Guided Imagery Standardized Assessments completed: Not Needed  Patient and/or Family Response: Patient was present for today's virtual session and reported a notable increase in symptoms of depression, anxiety, and insomnia following the recent stillbirth of her infant son. She shared that she is also the primary caregiver for her two-year-old son, who has a tracheostomy and gastrostomy tube, which adds to her emotional and physical exhaustion. Patient expressed frustration and disappointment, stating she feels let down by the healthcare system regarding the support and treatment she has received during this period of profound loss. She reported current use of Lexapro , which she feels is offering partial relief, and disclosed new physical symptoms, including swelling in her legs, which she intends to report to her medical provider. As part of her coping efforts, patient continues to find support through communication with her mother and husband, engaging in prayer, and reading. She verbalized an understanding of the grieving process and expressed a desire to be more intentional in giving herself permission to grieve and honor her baby.  Assessment: Patient currently experiencing acute grief, heightened depression, anxiety, and insomnia following the loss of her infant son. She is also managing the complex medical needs of her living child, contributing to emotional and physical exhaustion..   Patient may benefit from continued support from integrated behavioral health.  Plan: Follow up with behavioral health clinician on : 09/28/2023 Behavioral recommendation: Try to establish a gentle daily routine that includes moments for rest, emotional  expression (e.g., journaling or quiet reflection), and brief self-care activities to support emotional regulation and reduce overwhelm. Recommend engagement in a grief support group (virtual or in-person) to provide connection, validation, and shared understanding during the grieving  process. Referral(s): Integrated [employer] (In Clinic)  I discussed the assessment and treatment plan with the patient and/or parent/guardian. They were provided an opportunity to ask questions and all were answered. They agreed with the plan and demonstrated an understanding of the instructions.   They were advised to call back or seek an in-person evaluation if the symptoms worsen or if the condition fails to improve as anticipated.  Haley Kramer Iles, LCSWA

## 2023-09-22 ENCOUNTER — Ambulatory Visit: Payer: Self-pay

## 2023-09-22 ENCOUNTER — Other Ambulatory Visit: Payer: Self-pay | Admitting: Obstetrics & Gynecology

## 2023-09-22 ENCOUNTER — Ambulatory Visit

## 2023-09-22 VITALS — BP 133/78 | HR 89

## 2023-09-22 DIAGNOSIS — Z1331 Encounter for screening for depression: Secondary | ICD-10-CM | POA: Diagnosis not present

## 2023-09-22 MED ORDER — FUROSEMIDE 20 MG PO TABS: 20.0000 mg | ORAL_TABLET | Freq: Every day | ORAL | 0 refills | Status: AC

## 2023-09-22 MED ORDER — ESCITALOPRAM OXALATE 10 MG PO TABS: 10.0000 mg | ORAL_TABLET | Freq: Every day | ORAL | 0 refills | Status: DC

## 2023-09-22 MED ORDER — BUTALBITAL-APAP-CAFFEINE 50-325-40 MG PO CAPS
1.0000 | ORAL_CAPSULE | Freq: Four times a day (QID) | ORAL | 0 refills | Status: AC | PRN
Start: 2023-09-22 — End: ?

## 2023-09-22 NOTE — Progress Notes (Signed)
 Haley Kramer  [id number] here for postpartum depression screen. Pt is currently 2 weeks postpartum. Pt reports feeling depressed.  Consulted with Dr. Willey Harrier about pt. Pt reports that she is taking lexapro  but reports this does not seem to help her feelings of depression. Pt also does have +2 edema to legs. Pt BP within normal range today, but pt reports this is an elevated reading for her. She also reports constant headache that is not going away with OTC medication.   Pt is currently seeing BH.   Dr. Willey Harrier to send rx for headache, lasix 20mg  for 5 days sent for pt to alleviate swelling. Message sent to Dr. Willey Harrier regarding Lexapro  not helping with pt symptoms.   Discussed with provider results of Edinburgh 18.  Pt to follow up for a provider visit first week of June.

## 2023-09-22 NOTE — Progress Notes (Signed)
 Verbal order received from Dr. Willey Harrier to increase pt lexapro  dosage to 10mg  instead of 5mg  and to give medication up to 4 weeks to take full effect on depression.

## 2023-09-22 NOTE — Addendum Note (Signed)
 Addended by: Ina Manas on: 09/22/2023 02:18 PM   Modules accepted: Orders

## 2023-09-23 ENCOUNTER — Ambulatory Visit

## 2023-09-24 ENCOUNTER — Encounter: Admitting: Physician Assistant

## 2023-09-24 ENCOUNTER — Other Ambulatory Visit: Payer: Self-pay

## 2023-09-24 ENCOUNTER — Encounter: Admitting: Obstetrics and Gynecology

## 2023-09-24 ENCOUNTER — Inpatient Hospital Stay (HOSPITAL_COMMUNITY)
Admission: AD | Admit: 2023-09-24 | Discharge: 2023-09-25 | Disposition: A | Discharge status: Home / Self Care | Attending: Obstetrics and Gynecology | Admitting: Obstetrics and Gynecology

## 2023-09-24 DIAGNOSIS — R200 Anesthesia of skin: Secondary | ICD-10-CM | POA: Diagnosis not present

## 2023-09-24 DIAGNOSIS — I809 Phlebitis and thrombophlebitis of unspecified site: Secondary | ICD-10-CM

## 2023-09-24 DIAGNOSIS — R519 Headache, unspecified: Secondary | ICD-10-CM | POA: Diagnosis not present

## 2023-09-24 DIAGNOSIS — M25521 Pain in right elbow: Secondary | ICD-10-CM | POA: Diagnosis not present

## 2023-09-24 DIAGNOSIS — I808 Phlebitis and thrombophlebitis of other sites: Secondary | ICD-10-CM | POA: Diagnosis not present

## 2023-09-24 MED ORDER — SUMATRIPTAN SUCCINATE 25 MG PO TABS: 25.0000 mg | ORAL_TABLET | ORAL | 0 refills | Status: AC | PRN

## 2023-09-24 MED ORDER — IBUPROFEN 600 MG PO TABS
600.0000 mg | ORAL_TABLET | Freq: Once | ORAL | Status: AC
  Administered 2023-09-24: 600 mg via ORAL
  Filled 2023-09-24: qty 1

## 2023-09-24 MED ORDER — SUMATRIPTAN SUCCINATE 25 MG PO TABS
25.0000 mg | ORAL_TABLET | Freq: Once | ORAL | Status: AC
  Administered 2023-09-24: 25 mg via ORAL
  Filled 2023-09-24: qty 1

## 2023-09-24 MED ORDER — CYCLOBENZAPRINE HCL 5 MG PO TABS
10.0000 mg | ORAL_TABLET | Freq: Once | ORAL | Status: AC
  Administered 2023-09-24: 10 mg via ORAL
  Filled 2023-09-24: qty 2

## 2023-09-24 MED ORDER — CYCLOBENZAPRINE HCL 5 MG PO TABS: 10.0000 mg | ORAL_TABLET | Freq: Once | ORAL | Status: DC

## 2023-09-24 MED ORDER — ASPIRIN-ACETAMINOPHEN-CAFFEINE 250-250-65 MG PO TABS: 2.0000 | ORAL_TABLET | Freq: Once | ORAL | Status: DC

## 2023-09-24 MED ORDER — OXYCODONE-ACETAMINOPHEN 5-325 MG PO TABS: 1.0000 | ORAL_TABLET | Freq: Four times a day (QID) | ORAL | 0 refills | Status: DC | PRN

## 2023-09-24 NOTE — MAU Provider Note (Signed)
 Chief Complaint:  Headache and Numbness   Event Date/Time   First Provider Initiated Contact with Patient 09/24/23 2141       HPI: Haley Kramer is a 38 y.o. [id number] who presents to maternity admissions reporting headache for the past few days.  Office sent Rx for Fioricet but has not picked up yet. Also has pain in right elbow and numbness in forefinger.  Had two IVs in that arm that are sore. . She denies dizziness, n/v, or fever/chills.    Headache  This is a recurrent problem. The current episode started in the past 7 days. The pain is located in the Frontal region. The pain does not radiate. The quality of the pain is described as aching and dull. Associated symptoms include numbness (right finger). Pertinent negatives include no abdominal pain, blurred vision, fever, muscle aches or nausea.   RN Note: Haley Kramer is a 38 y.o. at [redacted]w[redacted]d here in MAU reporting a bad h/a for a week that is on and off. Pt had vag del at [redacted]w[redacted]d on 5/5. Received a script yesterday for h/a med which she picked up but has not taken. She has a son with a trach so did not want to be sleepy while needing to care for him. Took Tylenol  325mg  about 1400. States that helped alittle. States awoke this am and her R index finger tip felt numb. Medial side of R elbow is very tender and pt states alittle swollen. Does look ? Bigger compared to L elbow. Pt has loose skin making it alittle difficult to tell. No redness noted. Does feel that her R middle finger tip may be getting alittle numb. Pain in elbow is sharp and throbbing.   Past Medical History: Past Medical History:  Diagnosis Date   Anxiety    Depression    IDA (iron  deficiency anemia)    Incomplete abortion 01/14/2021   Iron  deficiency anemia, unspecified iron  deficiency anemia type    Obesity    PONV (postoperative nausea and vomiting)    Vitamin D  deficiency     Past obstetric history: OB History  Gravida Para Term Preterm AB Living  5 3 2 1 2 2    SAB IAB Ectopic Multiple Live Births  2 0 0 0 2    # Outcome Date GA Lbr Len/2nd Weight Sex Type Anes PTL Lv  5 Preterm 09/14/23 [redacted]w[redacted]d  451 g M VBAC None  FD  4 SAB 12/2022     SAB     3 Term 11/12/21 [redacted]w[redacted]d 06:19 / 00:56 2600 g M VBAC, Vacuum EPI  LIV  2 SAB 01/2021          1 Term 03/23/11   3090 g F CS-LTranv   LIV    Past Surgical History: Past Surgical History:  Procedure Laterality Date   CESAREAN SECTION  03/23/2011   CHOLECYSTECTOMY  11/2022   DILATION AND EVACUATION N/A 01/22/2021   Procedure: DILATATION AND EVACUATION;  Surgeon: Heron Lord, MD;  Location: [facility] ORS;  Service: Gynecology;  Laterality: N/A;   DILATION AND EVACUATION Bilateral 09/14/2023   Procedure: DILATION AND EVACUATION, UTERUS;  Surgeon: Malka Sea, DO;  Location: [facility];  Service: Gynecology;  Laterality: Bilateral;   ESOPHAGOGASTRODUODENOSCOPY  2016   gallbladder     GASTRIC BYPASS  2016   PERINEAL LACERATION REPAIR N/A 11/12/2021   Procedure: SUTURE REPAIR PERINEAL LACERATION;  Surgeon: Abigail Abler, MD;  Location: [facility];  Service: Obstetrics;  Laterality: N/A;    Family History: Family History  Problem Relation Age of Onset   Hypertension Mother    Diabetes Mother    Hypertension Father    Diabetes Father    Bipolar disorder Sister    Schizophrenia Sister    Narcolepsy Sister    Healthy Sister    Hypertension Maternal Grandmother    Diabetes Maternal Grandmother    Hypertension Maternal Grandfather    Diabetes Maternal Grandfather    Hypertension Paternal Grandmother    Hypertension Paternal Grandfather     Social History: Social History   Tobacco Use   Smoking status: Never   Smokeless tobacco: Never  Vaping Use   Vaping status: Never Used  Substance Use Topics   Alcohol use: Not Currently    Comment: in the past   Drug use: Never    Allergies: No Known Allergies  Meds:  Medications Prior to Admission  Medication Sig Dispense Refill Last  Dose/Taking   Butalbital-APAP-Caffeine 50-325-40 MG capsule Take 1-2 capsules by mouth every 6 (six) hours as needed for headache. 15 capsule 0    escitalopram  (LEXAPRO ) 10 MG tablet Take 1 tablet (10 mg total) by mouth daily. 90 tablet 0    furosemide (LASIX) 20 MG tablet Take 1 tablet (20 mg total) by mouth daily for 5 days. 5 tablet 0    ibuprofen  (ADVIL ) 600 MG tablet Take 1 tablet (600 mg total) by mouth every 6 (six) hours as needed for cramping. 30 tablet 0    zolpidem  (AMBIEN ) 5 MG tablet Take 1 tablet (5 mg total) by mouth at bedtime as needed for sleep. 15 tablet 1     I have reviewed patient's Past Medical Hx, Surgical Hx, Family Hx, Social Hx, medications and allergies.  ROS:  Review of Systems  Constitutional:  Negative for fever.  Eyes:  Negative for blurred vision.  Gastrointestinal:  Negative for abdominal pain and nausea.  Neurological:  Positive for numbness (right finger) and headaches.   Other systems negative     Physical Exam  Patient Vitals for the past 24 hrs:  BP Temp Pulse Resp SpO2 Height Weight  09/24/23 2121 118/63 -- -- -- -- -- --  09/24/23 2117 -- 98.6 F (37 C) 73 17 97 % 5\' 8"  (1.727 m) 98.4 kg   Constitutional: Well-developed, well-nourished female in no acute distress.  Cardiovascular: normal rate Respiratory: normal effort, no distress.  GI: Abd soft, non-tender.  Nondistended.  No rebound, No guarding.  Bowel Sounds audible  MS: Extremities nontender, no edema, normal ROM         Right arm with tender IV sites, some edema over forearm, good ROM and capillary refill  Neurologic: Alert and oriented x 4.   Grossly nonfocal. GU: Neg CVAT. Skin:  Warm and Dry   Labs: No results found for this or any previous visit (from the past 24 hours).  --/--/O NEG (05/04 1247)  Imaging:    MAU Course/MDM: I have reviewed the triage vital signs and the nursing notes.   Pertinent labs & imaging results that were available during my care of the  patient were reviewed by me and considered in my medical decision making (see chart for details).      I have reviewed her medical records including past results, notes and treatments.    Treatments in MAU included Flexeril  and ibuprofen  which didn't help headache much.[place] Imitrex which helped a little.  Does not want to try any other meds.  But accepts Rx for Percocet to use at home Discussed right arm is likely superficial phlebitis, doubt DVT but will order Doppler to be sure..   Pt stable at time of discharge.  Assessment: Postpartum Day#10 Headache Right arm phlebitis with finger numbness  Plan: Discharge home Recommend followup in am for Doppler study(ordered) Rx sent for Percocet for headache Rx Imitrex for headache Come back if headache does not improve  Encouraged to return here or to other Urgent Care/ED if she develops worsening of symptoms, increase in pain, fever, or other concerning symptoms.   Holmes Lusher CNM, MSN Certified Nurse-Midwife 09/24/2023 9:41 PM

## 2023-09-24 NOTE — MAU Note (Signed)
 Haley Kramer is a 38 y.o. at [redacted]w[redacted]d here in MAU reporting a bad h/a for a week that is on and off. Pt had vag del at [redacted]w[redacted]d on 5/5. Received a script yesterday for h/a med which she picked up but has not taken. She has a son with a trach so did not want to be sleepy while needing to care for him. Took Tylenol  325mg  about 1400. States that helped alittle. States awoke this am and her R index finger tip felt numb. Medial side of R elbow is very tender and pt states alittle swollen. Does look ? Bigger compared to L elbow. Pt has loose skin making it alittle difficult to tell. No redness noted. Does feel that her R middle finger tip may be getting alittle numb. Pain in elbow is sharp and throbbing.   LMP: n/a Onset of complaint: this am Pain score: 8 for elbow and 7 for h/a Vitals:   09/24/23 2117 09/24/23 2121  BP:  118/63  Pulse: 73   Resp: 17   Temp: 98.6 F (37 C)   SpO2: 97%      FHT: n/a  Lab orders placed from triage: none

## 2023-09-25 ENCOUNTER — Ambulatory Visit (HOSPITAL_COMMUNITY)
Admission: RE | Admit: 2023-09-25 | Discharge: 2023-09-25 | Disposition: A | Discharge status: Home / Self Care | Source: Ambulatory Visit | Attending: Advanced Practice Midwife | Admitting: Advanced Practice Midwife

## 2023-09-25 ENCOUNTER — Other Ambulatory Visit: Payer: Self-pay | Admitting: Advanced Practice Midwife

## 2023-09-25 DIAGNOSIS — I809 Phlebitis and thrombophlebitis of unspecified site: Secondary | ICD-10-CM

## 2023-09-25 DIAGNOSIS — R200 Anesthesia of skin: Secondary | ICD-10-CM | POA: Insufficient documentation

## 2023-09-25 DIAGNOSIS — R519 Headache, unspecified: Secondary | ICD-10-CM | POA: Insufficient documentation

## 2023-09-25 DIAGNOSIS — I808 Phlebitis and thrombophlebitis of other sites: Secondary | ICD-10-CM | POA: Diagnosis not present

## 2023-09-26 LAB — MCC TRACKING

## 2023-09-28 ENCOUNTER — Ambulatory Visit: Admitting: Licensed Clinical Social Worker

## 2023-09-28 ENCOUNTER — Ambulatory Visit: Payer: Self-pay | Admitting: Advanced Practice Midwife

## 2023-09-28 DIAGNOSIS — F5102 Adjustment insomnia: Secondary | ICD-10-CM

## 2023-09-28 DIAGNOSIS — F4323 Adjustment disorder with mixed anxiety and depressed mood: Secondary | ICD-10-CM | POA: Diagnosis not present

## 2023-09-28 DIAGNOSIS — F4321 Adjustment disorder with depressed mood: Secondary | ICD-10-CM

## 2023-09-28 LAB — AFP, AMNIOTIC FLUID
AFP, Amniotic Fluid (mcg/ml): 5 ug/mL
Gestational Age(Wks): 19
MOM, Amniotic Fluid: 0.64

## 2023-09-28 LAB — CHROMOSOME, AMNIOTIC FLUID
Cells Analyzed: 20
Cells Counted: 20
Cells Karyotyped: 2
Colonies: 2
GTG Band Resolution Achieved: 450

## 2023-09-28 LAB — TOXOPLASMA GONDII, AMNIOTIC FLUID, PCR: Toxoplasma Gondii PCR Amniotic: NEGATIVE

## 2023-09-28 LAB — MATERNAL CELL CONTAMINATION

## 2023-09-28 LAB — CYTOMEGALOVIRUS (CMV), AMNIOTIC FLUID, PCR: CMV PCR, Amniotic: NEGATIVE

## 2023-09-28 NOTE — BH Specialist Note (Unsigned)
 Integrated Behavioral Health via Telemedicine Visit  10/06/2023 Haley Kramer [id number]  Number of Integrated Behavioral Health Clinician visits: 2- Second Visit  Session Start time: 0812  Session End time: 0828  Total time in minutes: 16   Referring Provider: Dr. Donetta Furl Patient/Family location: At [facility] Provider location: Remote Office All persons participating in visit: Patient and Haley Kramer Types of Service: Individual psychotherapy and Video visit  I connected with Haley Kramer and/or Haley Kramer's patient via  Telephone or Video Enabled Telemedicine Application  (Video is Caregility application) and verified that I am speaking with the correct person using two identifiers. Discussed confidentiality: Yes   I discussed the limitations of telemedicine and the availability of in person appointments.  Discussed there is a possibility of technology failure and discussed alternative modes of communication if that failure occurs.  I discussed that engaging in this telemedicine visit, they consent to the provision of behavioral healthcare and the services will be billed under their insurance.  Patient and/or legal guardian expressed understanding and consented to Telemedicine visit: Yes   Presenting Concerns: Patient and/or family reports the following symptoms/concerns: Increased symptoms of depression and anxiety Duration of problem: Weeks; Severity of problem: moderate  Patient and/or Family's Strengths/Protective Factors: Concrete supports in place (healthy food, safe environments, etc.), Physical Health (exercise, healthy diet, medication compliance, etc.), and Parental Resilience  Goals Addressed: Patient will:  Reduce symptoms of: anxiety and depression   Increase knowledge and/or ability of: coping skills, healthy habits, and self-management skills   Demonstrate ability to: Increase healthy adjustment to current life circumstances  Progress towards  Goals: Ongoing  Interventions: Interventions utilized:  Supportive Counseling, Psychoeducation and/or Health Education, Communication Skills, and Supportive Reflection Standardized Assessments completed: Not Needed  Patient and/or Family Response: The patient was present for the virtual session. She reported experiencing tenderness and numbness in her arm last Friday and initially consulted her provider, who attributed the symptoms to sleeping in an awkward position. However, the patient expressed feeling that her concerns were minimized. Due to the persistence of her symptoms, she sought emergency care and was subsequently diagnosed with blood clots in her arm. She was referred back to her primary care provider for follow-up and advised to take Advil  in the interim. The patient also discussed ongoing stress related to the care of her oldest son, who requires in-home nursing support 16 hours a day, four days a week. She reported that the assigned nurses have consistently failed to show up, resulting in her providing the care herself. She shared that she has secured a new home care agency scheduled to begin services on the 29th of this month. The patient reported taking Imitrex  and Percocet for migraines and Lexapro  for depression. She stated that reading a grief-focused book and having open conversations with her husband about her emotions has been helpful in processing her grief. During the session, the [facility] observed the patient dozing intermittently. The patient also shared that she is considering organizing a small memorial service for her deceased son but noted that addressing her current medical issue with the blood clots takes priority.   Assessment: Patient currently experiencing physical health concerns related to blood clots in her arm, ongoing caregiver stress due to inadequate nursing support for her son, and emotional challenges related to grief and loss. She also reports fatigue and is  actively managing her mental health with medication and supportive conversations.   Patient may benefit from continued support of integrated behavior health.  Plan:  Follow up with behavioral health clinician on : 10/12/23 Behavioral recommendations: Follow up with your primary care provider promptly to address and manage the blood clots and associated symptoms. Engage in regular therapeutic support to process grief, reduce caregiver stress, and explore additional respite care options or community resources for support. Referral(s): Integrated [employer] (In Clinic)  I discussed the assessment and treatment plan with the patient and/or parent/guardian. They were provided an opportunity to ask questions and all were answered. They agreed with the plan and demonstrated an understanding of the instructions.   They were advised to call back or seek an in-person evaluation if the symptoms worsen or if the condition fails to improve as anticipated.  Haley Kramer Haley Kramer, LCSWA

## 2023-10-02 ENCOUNTER — Ambulatory Visit (HOSPITAL_COMMUNITY): Admitting: Licensed Clinical Social Worker

## 2023-10-02 DIAGNOSIS — F53 Postpartum depression: Secondary | ICD-10-CM | POA: Diagnosis not present
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
## 2023-10-06 ENCOUNTER — Other Ambulatory Visit: Payer: Self-pay | Admitting: Obstetrics and Gynecology

## 2023-10-06 ENCOUNTER — Ambulatory Visit (HOSPITAL_COMMUNITY): Admitting: Student

## 2023-10-06 ENCOUNTER — Encounter (HOSPITAL_COMMUNITY): Payer: Self-pay | Admitting: Student

## 2023-10-06 DIAGNOSIS — F3289 Other specified depressive episodes: Secondary | ICD-10-CM

## 2023-10-06 DIAGNOSIS — R519 Headache, unspecified: Secondary | ICD-10-CM

## 2023-10-06 DIAGNOSIS — F4310 Post-traumatic stress disorder, unspecified: Secondary | ICD-10-CM | POA: Diagnosis not present

## 2023-10-06 DIAGNOSIS — D509 Iron deficiency anemia, unspecified: Secondary | ICD-10-CM | POA: Insufficient documentation

## 2023-10-06 DIAGNOSIS — F332 Major depressive disorder, recurrent severe without psychotic features: Secondary | ICD-10-CM

## 2023-10-06 DIAGNOSIS — F418 Other specified anxiety disorders: Secondary | ICD-10-CM

## 2023-10-06 DIAGNOSIS — E559 Vitamin D deficiency, unspecified: Secondary | ICD-10-CM

## 2023-10-06 DIAGNOSIS — F5102 Adjustment insomnia: Secondary | ICD-10-CM | POA: Insufficient documentation

## 2023-10-06 DIAGNOSIS — G4726 Circadian rhythm sleep disorder, shift work type: Secondary | ICD-10-CM | POA: Insufficient documentation

## 2023-10-06 DIAGNOSIS — Z903 Acquired absence of stomach [part of]: Secondary | ICD-10-CM

## 2023-10-06 DIAGNOSIS — F5105 Insomnia due to other mental disorder: Secondary | ICD-10-CM

## 2023-10-06 DIAGNOSIS — Z72821 Inadequate sleep hygiene: Secondary | ICD-10-CM | POA: Insufficient documentation

## 2023-10-06 DIAGNOSIS — O99345 Other mental disorders complicating the puerperium: Secondary | ICD-10-CM

## 2023-10-06 DIAGNOSIS — Z862 Personal history of diseases of the blood and blood-forming organs and certain disorders involving the immune mechanism: Secondary | ICD-10-CM

## 2023-10-06 HISTORY — DX: Circadian rhythm sleep disorder, shift work type: G47.26

## 2023-10-06 HISTORY — DX: Adjustment insomnia: F51.02

## 2023-10-06 HISTORY — DX: Personal history of diseases of the blood and blood-forming organs and certain disorders involving the immune mechanism: Z86.2

## 2023-10-06 HISTORY — DX: Iron deficiency anemia, unspecified: D50.9

## 2023-10-06 MED ORDER — MELATONIN 1 MG PO CAPS: 1.0000 mg | ORAL_CAPSULE | Freq: Every day | ORAL | Status: DC

## 2023-10-06 MED ORDER — GABAPENTIN 100 MG PO CAPS: 100.0000 mg | ORAL_CAPSULE | Freq: Every evening | ORAL | 0 refills | Status: DC | PRN

## 2023-10-06 MED ORDER — ESCITALOPRAM OXALATE 20 MG PO TABS: 20.0000 mg | ORAL_TABLET | Freq: Every evening | ORAL | 2 refills | Status: DC
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
## 2023-10-07 MED ORDER — VITAMIN D (ERGOCALCIFEROL) 1.25 MG (50000 UNIT) PO CAPS: 50000.0000 [IU] | ORAL_CAPSULE | ORAL | 0 refills | Status: DC

## 2023-10-12 ENCOUNTER — Ambulatory Visit (INDEPENDENT_AMBULATORY_CARE_PROVIDER_SITE_OTHER): Admitting: Obstetrics & Gynecology

## 2023-10-12 ENCOUNTER — Ambulatory Visit: Admitting: Licensed Clinical Social Worker

## 2023-10-12 VITALS — BP 105/66 | HR 60 | Ht 67.0 in | Wt 213.0 lb

## 2023-10-12 DIAGNOSIS — O364XX0 Maternal care for intrauterine death, not applicable or unspecified: Secondary | ICD-10-CM

## 2023-10-12 DIAGNOSIS — Z91199 Patient's noncompliance with other medical treatment and regimen due to unspecified reason: Secondary | ICD-10-CM

## 2023-10-12 NOTE — Progress Notes (Signed)
   POST SECOND TRIMESTER LOSS VISIT NOTE  Subjective:  Haley Kramer is a 38 y.o. [id number] S/P fetal demise at [redacted]w[redacted]d being seen today for care.   She hopes to conceive without delay. Autopsy and genetics were negative Patient reports no bleeding.   .  .   . Denies leaking of fluid.   The following portions of the patient's history were reviewed and updated as appropriate: allergies, current medications, past family history, past medical history, past social history, past surgical history and problem list.   Objective:    Vitals:   10/12/23 1553  BP: 105/66  Pulse: 60  Weight: 213 lb (96.6 kg)  Height: 5\' 7"  (1.702 m)    Fetal Status:           General: Alert, oriented and cooperative. Patient is in no acute distress.  Skin: Skin is warm and dry. No rash noted.   Cardiovascular: Normal heart rate noted  Respiratory: Normal respiratory effort, no problems with respiration noted  Abdomen: Soft, gravid, appropriate for gestational age.        Pelvic: Cervical exam deferred        Extremities: Normal range of motion.     Mental Status: Normal mood and affect. Normal behavior. Normal judgment and thought content.   Assessment and Plan:  Pregnancy: [id number] at [redacted]w[redacted]d 1. IUFD at 20 weeks or more of gestation (Primary) Labs requested by PCP - Thyroid Panel With TSH    Please refer to After Visit Summary for other counseling recommendations.   Return if symptoms worsen or fail to improve.  Future Appointments  Date Time Provider Department Center  11/09/2023  3:00 PM [facility] None  11/09/2023  3:15 PM Avonne Boettcher, MD [facility] None  11/26/2023 10:30 AM Baltazar Bonier, MD [facility] None  02/03/2024  2:45 PM Debbra Fairy, MD [facility] None    Onnie Bilis, MD

## 2023-10-12 NOTE — Progress Notes (Signed)
 Pt is in office for PP appt after a 21 week IUFD.   Pt would like to discuss future pregnancies - would like to be pregnancy again soon.

## 2023-10-12 NOTE — BH Specialist Note (Signed)
 Virtual link was sent to patient. A phone call was also provided. Patient no showed for today's visit.

## 2023-10-14 LAB — THYROID PANEL WITH TSH
Free Thyroxine Index: 2.1 (ref 1.2–4.9)
T3 Uptake Ratio: 23 % — ABNORMAL LOW (ref 24–39)
T4, Total: 9.1 ug/dL (ref 4.5–12.0)
TSH: 1.71 u[IU]/mL (ref 0.450–4.500)

## 2023-10-15 ENCOUNTER — Other Ambulatory Visit (HOSPITAL_COMMUNITY): Payer: Self-pay | Admitting: Student

## 2023-10-15 ENCOUNTER — Ambulatory Visit: Payer: Self-pay | Admitting: Obstetrics & Gynecology

## 2023-10-15 DIAGNOSIS — E559 Vitamin D deficiency, unspecified: Secondary | ICD-10-CM

## 2023-10-15 DIAGNOSIS — O99345 Other mental disorders complicating the puerperium: Secondary | ICD-10-CM

## 2023-10-15 DIAGNOSIS — F418 Other specified anxiety disorders: Secondary | ICD-10-CM

## 2023-10-15 DIAGNOSIS — G4726 Circadian rhythm sleep disorder, shift work type: Secondary | ICD-10-CM

## 2023-10-15 DIAGNOSIS — F4310 Post-traumatic stress disorder, unspecified: Secondary | ICD-10-CM

## 2023-10-15 MED ORDER — GABAPENTIN 100 MG PO CAPS: 100.0000 mg | ORAL_CAPSULE | Freq: Every evening | ORAL | 0 refills | Status: DC | PRN

## 2023-10-15 NOTE — Progress Notes (Signed)
 Increasing gabapentin  from 100 mg nightly prn -> gabapentin  100-200 mg qPM PRN. No help with sleep at 100 mg, but effective for sleep at 200 mg  Discussed with attending

## 2023-10-29 ENCOUNTER — Other Ambulatory Visit (HOSPITAL_COMMUNITY): Payer: Self-pay | Admitting: Student

## 2023-10-29 DIAGNOSIS — F418 Other specified anxiety disorders: Secondary | ICD-10-CM

## 2023-10-29 DIAGNOSIS — G4726 Circadian rhythm sleep disorder, shift work type: Secondary | ICD-10-CM

## 2023-10-29 DIAGNOSIS — F4310 Post-traumatic stress disorder, unspecified: Secondary | ICD-10-CM

## 2023-10-29 DIAGNOSIS — E559 Vitamin D deficiency, unspecified: Secondary | ICD-10-CM

## 2023-10-29 DIAGNOSIS — O99345 Other mental disorders complicating the puerperium: Secondary | ICD-10-CM

## 2023-11-09 ENCOUNTER — Inpatient Hospital Stay: Payer: Medicaid Other | Admitting: Oncology

## 2023-11-09 ENCOUNTER — Inpatient Hospital Stay: Payer: Medicaid Other | Attending: Oncology
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
## 2023-11-26 ENCOUNTER — Ambulatory Visit (HOSPITAL_COMMUNITY): Admitting: Student in an Organized Health Care Education/Training Program

## 2023-11-26 DIAGNOSIS — G4726 Circadian rhythm sleep disorder, shift work type: Secondary | ICD-10-CM

## 2023-11-26 DIAGNOSIS — E559 Vitamin D deficiency, unspecified: Secondary | ICD-10-CM

## 2023-11-26 DIAGNOSIS — F339 Major depressive disorder, recurrent, unspecified: Secondary | ICD-10-CM | POA: Diagnosis not present

## 2023-11-26 DIAGNOSIS — F4310 Post-traumatic stress disorder, unspecified: Secondary | ICD-10-CM | POA: Diagnosis not present

## 2023-11-26 DIAGNOSIS — F4321 Adjustment disorder with depressed mood: Secondary | ICD-10-CM

## 2023-11-26 DIAGNOSIS — Z634 Disappearance and death of family member: Secondary | ICD-10-CM

## 2023-11-26 DIAGNOSIS — F5105 Insomnia due to other mental disorder: Secondary | ICD-10-CM

## 2023-11-26 DIAGNOSIS — F418 Other specified anxiety disorders: Secondary | ICD-10-CM

## 2023-11-26 DIAGNOSIS — O99345 Other mental disorders complicating the puerperium: Secondary | ICD-10-CM

## 2023-11-26 MED ORDER — GABAPENTIN 100 MG PO CAPS: 100.0000 mg | ORAL_CAPSULE | Freq: Every evening | ORAL | 0 refills | Status: DC | PRN

## 2023-11-26 NOTE — Patient Instructions (Addendum)
 Grief Counseling Resources [facility] [address] [place], [place], [address] [phone] office [phone] toll free number  To make an appointment for grief and loss counseling, call [phone].  [facility] offers support groups and workshops for adults and children as well as one-on-one counseling for adolescents and children.

## 2023-12-09 ENCOUNTER — Other Ambulatory Visit: Payer: Self-pay | Admitting: Obstetrics and Gynecology

## 2023-12-10 ENCOUNTER — Telehealth (HOSPITAL_COMMUNITY): Payer: Self-pay

## 2023-12-10 NOTE — Telephone Encounter (Signed)
 Patient called in stating that her Primary Care provider wants her to completely stop the gabapentin  (NEURONTIN ) 100 MG capsule due to her gaining weight. Patient is requesting advice from provider on what she should do. Patient would also like to discuss  being prescribed an appetite Supressant.

## 2023-12-11 NOTE — Progress Notes (Unsigned)
  [facility] [address] [place] [address] Dept: [phone] Dept Fax: [phone]  Psychotherapy Progress Note  Patient ID: Haley Kramer, female  DOB: 09-Jan-1986, 38 y.o.  MRN: [id number]  12/11/2023 Start time: *** End time: ***  Method of Visit: {Method of Visit:27865}  Present: {family members:20773}  Current Concerns: ***  Current Symptoms: {Current [phone]}  Psychiatric Specialty Exam: General Appearance: {Appearance:22683}  Eye Contact:  {BHH EYE CONTACT:22684}  Speech:  {Speech:22685}  Volume:  {Volume (PAA):22686}  Mood:  {BHH MOOD:22306}  Affect:  {Affect (PAA):22687}  Thought Process:  {Thought Process (PAA):22688}  Orientation:  {BHH ORIENTATION (PAA):22689}  Thought Content:  {Thought Content:22690}  Suicidal Thoughts:  {ST/HT (PAA):22692}  Homicidal Thoughts:  {ST/HT (PAA):22692}  Memory:  {BHH MEMORY:22881}  Judgement:  {Judgement (PAA):22694}  Insight:  {Insight (PAA):22695}  Psychomotor Activity:  {Psychomotor (PAA):22696}  Concentration:  {Concentration:21399}  Recall:  {BHH GOOD/FAIR/POOR:22877}  Fund of Knowledge:{BHH GOOD/FAIR/POOR:22877}  Language: {BHH GOOD/FAIR/POOR:22877}  Akathisia:  {BHH YES OR NO:22294}  Handed:  {Handed:22697}  AIMS (if indicated):  {Desc; done/not:10129}  Assets:  {Assets (PAA):22698}  ADL's:  {BHH JIO'D:77709}  Cognition: {chl bhh cognition:304700322}  Sleep:  {BHH GOOD/FAIR/POOR:22877}   Diagnosis: MDD, PTSD  Anticipated Frequency of Visits: *** Anticipated Length of Treatment Episode: ***  Short Term Goals/Goals for Treatment Session: *** Progress Towards Goals: {Progress Towards Goals:21014066}  Treatment Intervention: {Treatment [phone]}  Medical Necessity: {Medical Necessity:210140004}  Assessment Tools:    11/26/2023   10:49 AM 10/02/2023    8:10 AM 09/22/2023   11:16 AM  Depression screen PHQ 2/9  Decreased  Interest 3 3 3   Down, Depressed, Hopeless 3 3 3   PHQ - 2 Score 6 6 6   Altered sleeping 3 3 3   Tired, decreased energy 3  3  Change in appetite 3 3 1   Feeling bad or failure about yourself  3 3 2   Trouble concentrating 3 2 2   Moving slowly or fidgety/restless 1 1 0  Suicidal thoughts 1 1 1   PHQ-9 Score 23 19 18   Difficult doing work/chores Extremely dIfficult Extremely dIfficult Very difficult   Failed to redirect to the Timeline version of the REVFS SmartLink. Flowsheet Row Clinical Support from 11/26/2023 in [facility] Counselor from 10/02/2023 in [facility] Admission (Discharged) from 09/13/2023 in [place] [facility]  C-SSRS RISK CATEGORY Error: Q3, 4, or 5 should not be populated when Q2 is No Low Risk No Risk    Collaboration of Care: {BH OP Collaboration of Care:21014065}  Patient/Guardian was advised Release of Information must be obtained prior to any record release in order to collaborate their care with an outside provider. Patient/Guardian was advised if they have not already done so to contact the registration department to sign all necessary forms in order for us  to release information regarding their care.   Consent: Patient/Guardian gives verbal consent for treatment and assignment of benefits for services provided during this visit. Patient/Guardian expressed understanding and agreed to proceed.   Plan: ***  Corean Minor, MD, PGY-3 12/11/2023

## 2023-12-11 NOTE — Telephone Encounter (Signed)
 Attempted to call and speak with patient this afternoon at [phone], no answer, LVM.   Tarren Sabree Carrin Carrero, MD PGY-3, [facility]

## 2023-12-15 ENCOUNTER — Telehealth (HOSPITAL_COMMUNITY): Payer: Self-pay | Admitting: Student in an Organized Health Care Education/Training Program

## 2023-12-15 NOTE — Telephone Encounter (Signed)
 I was able to speak with the patient and discussed her concerns about her prescription of gabapentin .  She reports she had a recent visit with her PCP at [facility], who advised her to completely discontinue use of gabapentin  due to increased weight gain.  Patient shares that gabapentin  is the only medication that she is found effective for sleep.  I advised the patient to continue taking the medication nightly until her next medication management appointment.  Will plan to reach out and speak with PCP regarding this recommendation.  Haley Tessler Carrin Carrero, MD PGY-3, [facility]

## 2023-12-17 ENCOUNTER — Telehealth (HOSPITAL_COMMUNITY): Payer: Self-pay | Admitting: Psychiatry

## 2023-12-17 ENCOUNTER — Ambulatory Visit (HOSPITAL_COMMUNITY): Admitting: Psychiatry

## 2023-12-17 NOTE — Telephone Encounter (Signed)
 I called patient at [phone] regarding scheduled therapy appointment. Patient picked up the phone and relayed that she had called the office yesterday around 4:50pm and left a voicemail stating that she wouldn't be able to make the therapy appointment today due to an issue that came up with her son. She reported that she wanted to reschedule this appointment. Discussed will reach out to front desk regarding rescheduling patient's therapy appointment. Patient expressed appreciation for the call.

## 2023-12-25 NOTE — Progress Notes (Unsigned)
 [facility] [address] [place] [address] Dept: [phone] Dept Fax: [phone]  Psychotherapy Progress Note  Patient ID: Haley Kramer, female  DOB: 06/29/1985, 38 y.o.  MRN: [id number]  12/31/2023 Start time: 8:00am End time: 8:50am  Method of Visit: Face-to-Face  Present: patient  Current Concerns: anxiety, depression  Current Symptoms: Anger, Anxiety, and Depressed Mood  Psychiatric Specialty Exam: General Appearance: Casual, has braces  Eye Contact:  Fair  Speech:  Clear and Coherent  Volume:  Normal  Mood:  Depressed  Affect:  Appropriate  Thought Process:  Coherent  Orientation:  Full (Time, Place, and Person)  Thought Content:  Logical and Rumination  Suicidal Thoughts:  No  Homicidal Thoughts:  No  Memory:  Immediate;   Fair  Judgement:  Fair  Insight:  Good  Psychomotor Activity:  Normal  Concentration:  Concentration: Fair  Recall:  [employer] of Knowledge:Fair  Language: Good  Akathisia:  No  Handed:  unknown  AIMS (if indicated):  not done  Assets:  Communication Skills Desire for Improvement Housing Intimacy Resilience Social Support Talents/Skills  ADL's:  Intact  Cognition: WNL  Sleep:  Fair   Diagnosis: MDD, PTSD  Anticipated Frequency of Visits: biweekly Anticipated Length of Treatment Episode: 20 sessions  Short Term Goals/Goals for Treatment Session: find different stress relief, less negative thoughts Progress Towards Goals: Initial  Patient presents to therapy by herself. Discussed overview of CBT principles. Her goal is to find different stress relief and to have less negative thoughts.   We discussed and went through initial thought record. The situation that she wanted to discuss was her stillborn son in May. She was at [facility] with her husband. She has 2 other kids (one is 58 - Mackenzie, the other is 2 - Red[place]ton - he was born with a narrow airway and  requires a trach). She reports she was 6 mo pregnant when she found that baby didn't have heartbeat and then she had to deliver baby.  Her emotions were anger, sadness, and confused. She rated all of these at 100%. She reports she was angry with God and her husband and upset at herself. We identified many automatic thoughts that she had. A lot were asking herself what happened and why did this happen and how did this happen. She expressed a thought that she didn't want to face the people that she had shared this happy news with before. She was asking herself why me? What did I do? She identified one thought was that she felt that she was too stressed and stressed her body out which is what caused the stillborn. She also was angry at her husband and God, asking herself why she would fall in love with a man who could not give her the children she wanted. She identified another thought as all my plans are washed away.   We decided to work on 2 automatic thoughts - all my plans are washed away and I stressed my body out and that caused the stillbirth. We identified evidence that supported the thought about the plans including she was not going to be able to homeschool all of the children.  Her plan of being in normal baby home and having a complete family was not going to happen.  We also identified evidence that did not support the thought.  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  She reports that she also had thoughts that she would not be able to live anymore and the evidence that does not support that that is that she is currently living and she has not had a heart attack and so her plan that she would not be living and that she would have a heart attack with did not happen.  She also reports that she got back in touch with old friends due to the situation.  She reports that in response to these thoughts or behaviors that she  was feeling tired all the time and she was more irritable towards the nurses that come to her home.  She also was not getting out of bed and eating junk food.  She reports an alternative thought that she had was that some plants of hers are washed away but that she will get another chance.  We also discussed the other thought that she had about her stress causing the situation.  She reports the evidence that supported the thought was that she was feeling tired all the time.  She reports evidence that did not support that that is that her blood pressure was fine she did not have gestational diabetes.  She reports the doctors also said that the stress did not cause the stillbirth.  The evidence that also does not support is that her husband has a depletion on the chromosomes and that might of contributed.  She reports an alternative that that she has had is that she did not cause the situation to happen.  Another alternative thought is that some things are a mystery and should be left alone.  She reports that when she has these alternatives she does feel more joy and happiness.  She reports that in response her behaviors is that she will want to eat better, take online classes, cut out sugar and eat a Mediterranean diet.  At the end of the session, patient identified that the session was helpful.  Patient actually asked for additional printouts of the initial thought record.  We set up a follow-up appointment.  Our next appointment will be when her due date with her son was. She reports she was initially just thinking about laying in bed so it will be helpful to have the therapy appointment.   Treatment Intervention: Cognitive Behavioral therapy  Medical Necessity: Improved patient condition  Assessment Tools:    11/26/2023   10:49 AM 10/02/2023    8:10 AM 09/22/2023   11:16 AM  Depression screen PHQ 2/9  Decreased Interest 3 3 3   Down, Depressed, Hopeless 3 3 3   PHQ - 2 Score 6 6 6   Altered sleeping  3 3 3   Tired, decreased energy 3  3  Change in appetite 3 3 1   Feeling bad or failure about yourself  3 3 2   Trouble concentrating 3 2 2   Moving slowly or fidgety/restless 1 1 0  Suicidal thoughts 1 1 1   PHQ-9 Score 23 19 18   Difficult doing work/chores Extremely dIfficult Extremely dIfficult Very difficult   Failed to redirect to the Timeline version of the REVFS SmartLink. Flowsheet Row Clinical Support from 11/26/2023 in [facility] Counselor from 10/02/2023 in [facility] Admission (Discharged) from 09/13/2023 in [facility]  C-SSRS RISK CATEGORY Error: Q3, 4, or 5 should not be populated when Q2 is No Low Risk No Risk    Collaboration of Care: Other Psychologist Dr. Ezzard  Patient/Guardian was advised Release of Information must be obtained prior to any record release in order to collaborate their care with an outside provider. Patient/Guardian was advised if they have not already done so to contact the registration department to sign all necessary forms in order for us  to release information regarding their care.   Consent: Patient/Guardian gives verbal consent for treatment and assignment of benefits for services provided during this visit. Patient/Guardian expressed understanding and agreed to proceed.   Plan:  -continue to work on thought records for different situations in patient's life   Corean Minor, MD, PGY-3 12/31/2023

## 2023-12-31 ENCOUNTER — Ambulatory Visit (INDEPENDENT_AMBULATORY_CARE_PROVIDER_SITE_OTHER): Admitting: Psychiatry

## 2023-12-31 DIAGNOSIS — F4310 Post-traumatic stress disorder, unspecified: Secondary | ICD-10-CM

## 2023-12-31 DIAGNOSIS — F4321 Adjustment disorder with depressed mood: Secondary | ICD-10-CM

## 2023-12-31 DIAGNOSIS — Z634 Disappearance and death of family member: Secondary | ICD-10-CM | POA: Diagnosis not present

## 2023-12-31 DIAGNOSIS — F339 Major depressive disorder, recurrent, unspecified: Secondary | ICD-10-CM | POA: Diagnosis not present

## 2024-01-07 ENCOUNTER — Encounter (HOSPITAL_COMMUNITY): Admitting: Student in an Organized Health Care Education/Training Program

## 2024-01-10 NOTE — Progress Notes (Signed)
 [facility], [facility], [facility]  Hematology Oncology Consultation [phone]    New Patient Visit 01/14/2024   Consulting provider -Lauraine JONELLE Louder, ANP   Arna Ranks, MD [address] [place] [address]        ASSESSMENT 01/14/2024   Encounter Diagnoses  Name Primary?  . History of bariatric surgery Yes  . Low serum iron    . Iron  deficiency anemia, unspecified iron  deficiency anemia type      PLAN/ RECOMMENDATION 01/14/2024 Repeat CBC, Ferritin, Iron  panel -insurance will require labs within the last 14 days to authorize IV iron  infusion Pending results arrange for iron  infusion RTC in 3 months  Orders Placed This Encounter  Procedures  . CBC w/ Differential    Standing Status:   Future    Number of Occurrences:   1    Expected Date:   01/14/2024    Expiration Date:   01/13/2025  . Iron  & TIBC    Standing Status:   Future    Number of Occurrences:   1    Expected Date:   01/14/2024    Expiration Date:   01/13/2025  . Ferritin    Standing Status:   Future    Number of Occurrences:   1    Expected Date:   01/14/2024    Expiration Date:   01/13/2025    Review of Systems   Review of Systems  Constitutional:  Positive for fatigue.       Craving ice  Cardiovascular:  Positive for leg swelling (chronic puffiness/ swelling of both hands and feet which she says is typical for her). Negative for palpitations.  Gastrointestinal:  Negative for blood in stool, constipation, diarrhea, nausea and vomiting.  Genitourinary:  Negative for difficulty urinating and hematuria.   Neurological:  Positive for dizziness (occasional) and light-headedness (occasional). Negative for numbness.  Psychiatric/Behavioral:  The patient is nervous/anxious.      INTERVAL STATUS/ CURRENT HISTORY- OV 01/14/2024  Haley Kramer presents today for consultation from her PCP regarding iron  deficiency anemia.  She had labs drawn at her PCP on 12/23/2023 that showed a WBC  of 8.4, hemoglobin of 10.6, hematocrit of 36.4, and platelets of 289,000.  Ferritin level drawn on Monday was 6 and serum iron  was 18.  The patient has a history of bariatric surgery  9 years ago and is intolerant of oral iron .  She lost over 300 lbs post surgery. She has had iron  in the past.We will arrange for her to receive iron  infusions and see her back in 2 months. She is trying to conceive, she had  stillborn baby in May.  She is symptomatic with shortness of breath with exertion. She does crave ice. Denies palpitations or tachycardia, but does have some occasional restless leg.    ECOG: 0 - Fully active, able to carry on all pre-disease performance without restriction  PHYSICAL EXAM 01/14/2024  Physical Exam Vitals reviewed.  Constitutional:      Appearance: Normal appearance.  Eyes:     Extraocular Movements: Extraocular movements intact.  Cardiovascular:     Rate and Rhythm: Normal rate and regular rhythm.  Pulmonary:     Effort: Pulmonary effort is normal.     Breath sounds: Normal breath sounds.  Abdominal:     Palpations: Abdomen is soft.  Musculoskeletal:        General: Normal range of motion.     Cervical back: Normal range of motion.  Skin:  General: Skin is warm and dry.  Neurological:     General: No focal deficit present.     Mental Status: She is alert and oriented to person, place, and time.  Psychiatric:        Mood and Affect: Mood normal.        Behavior: Behavior normal.         01/14/24 1030  BP: 134/78  Pulse: 69  Resp: 16  Temp: 36.8 C (98.2 F)  SpO2: 97%  Weight: (!) 101.6 kg (223 lb 14.4 oz)  Height: 171 cm (5' 7.32)     Vitals: reviewed  Iron  panel from PCP on 12/23/2023 reviewed.  Hemoglobin on 12/23/2023 was 10. 6    Appointment on 01/14/2024  Component Date Value Ref Range Status  . Iron  01/14/2024 29 (L)  50 - 170 ug/dL Final  . TIBC 90/95/7974 499 (H)  250 - 425 ug/dL Final  . Iron  Saturation (%) 01/14/2024 6  % Final  .  WBC 01/14/2024 8.4  4.0 - 10.5 10*9/L Final  . RBC 01/14/2024 4.25  3.80 - 5.10 10*12/L Final  . HGB 01/14/2024 9.4 (L)  11.5 - 15.0 g/dL Final  . HCT 90/95/7974 31.1 (L)  34.0 - 44.0 % Final  . MCV 01/14/2024 73.2 (L)  80.0 - 98.0 fL Final  . MCH 01/14/2024 22.1 (L)  27.0 - 34.0 pg Final  . MCHC 01/14/2024 30.2 (L)  32.0 - 36.0 g/dL Final  . RDW 90/95/7974 20.5 (H)  11.5 - 14.5 % Final  . MPV 01/14/2024 9.6  7.4 - 10.4 fL Final  . Platelet 01/14/2024 260  140 - 415 10*9/L Final  . Neutrophils % 01/14/2024 54.3  % Final  . Lymphocytes % 01/14/2024 35.0  % Final  . Monocytes % 01/14/2024 7.9  % Final  . Eosinophils % 01/14/2024 1.8  % Final  . Basophils % 01/14/2024 0.8  % Final  . Absolute Neutrophils 01/14/2024 4.6  1.8 - 7.8 10*9/L Final  . Absolute Lymphocytes 01/14/2024 3.0  0.7 - 4.5 10*9/L Final  . Absolute Monocytes 01/14/2024 0.7  0.1 - 1.0 10*9/L Final  . Absolute Eosinophils 01/14/2024 0.2  0.0 - 0.4 10*9/L Final  . Absolute Basophils 01/14/2024 0.1  0.0 - 0.2 10*9/L Final  . Smear Review Comments 01/14/2024 See Comment  Undefined Final      No results found for this visit on 01/14/24.  Medications Ordered Prior to Encounter[1]   Hematology/Oncology History   No problem history exists.     I personally spent 30 minutes face-to-face and non-face-to-face in the care of this patient, which includes all pre, intra, and post visit time on the date of service.  All documented time was specific to the E/M visit and does not include any procedures that may have been performed.  In preparation of this visit, I reviewed onc history, past oncology visit notes, infusion history, past medical, social, family history.   This chart has been completed using Engineer, civil (consulting) software, and while attempts have been made to ensure accuracy, certain words and phrases may not be transcribed as intended.   Lauraine Louder, NP-C, AOCNP [facility] [phone]        [1] Current Outpatient Medications on File Prior to Visit  Medication Sig Dispense Refill  . ergocalciferol -1,250 mcg, 50,000 unit, (VITAMIN D2-1,250 MCG, 50,000 UNIT,) 1,250 mcg (50,000 unit) capsule Take 1 capsule (1,250 mcg total) by mouth once a week.    . PRENATE PIXIE  10 mg  iron - 1 mg-200 mg cap Take 1 capsule by mouth daily.     No current facility-administered medications on file prior to visit.

## 2024-01-14 NOTE — Progress Notes (Signed)
 Patient in clinic today ambulatory to establish care for history of anemia. Patient is alert and oriented. Height, weight and vitals obtained. Denies pain. No other concerns noted. Provider aware.   Will schedule any tests or procedures and arrange any referrals as indicated by provider. MyChart active.

## 2024-01-18 NOTE — Progress Notes (Unsigned)
 [facility] [address] [place] [address] Dept: [phone] Dept Fax: [phone]  Psychotherapy Progress Note  Patient ID: Haley Kramer, female  DOB: February 07, 1986, 38 y.o.  MRN: [id number]  01/18/2024 Start time: 8:00am End time: 8:50am  Method of Visit: Face-to-Face  Present: patient  Current Concerns: anxiety, depression  Current Symptoms: Anger, Anxiety, and Depressed Mood  Psychiatric Specialty Exam: General Appearance: Casual, has braces  Eye Contact:  Fair  Speech:  Clear and Coherent  Volume:  Normal  Mood:  Depressed  Affect:  Appropriate  Thought Process:  Coherent  Orientation:  Full (Time, Place, and Person)  Thought Content:  Logical and Rumination  Suicidal Thoughts:  No  Homicidal Thoughts:  No  Memory:  Immediate;   Fair  Judgement:  Fair  Insight:  Good  Psychomotor Activity:  Normal  Concentration:  Concentration: Fair  Recall:  [employer] of Knowledge:Fair  Language: Good  Akathisia:  No  Handed:  unknown  AIMS (if indicated):  not done  Assets:  Communication Skills Desire for Improvement Housing Intimacy Resilience Social Support Talents/Skills  ADL's:  Intact  Cognition: WNL  Sleep:  Fair   Diagnosis: MDD, PTSD  Anticipated Frequency of Visits: biweekly Anticipated Length of Treatment Episode: 20 sessions  Short Term Goals/Goals for Treatment Session: find different stress relief, less negative thoughts Progress Towards Goals: Initial  Patient presents to therapy by herself. Discussed overview of CBT principles. Her goal is to find different stress relief and to have less negative thoughts.   We discussed and went through initial thought record. The situation that she wanted to discuss was her stillborn son in May. She was at [facility] with her husband. She has 2 other kids (one is 80 - Mackenzie, the other is 2 - [place] - he was born with a narrow airway and  requires a trach). She reports she was 6 mo pregnant when she found that baby didn't have heartbeat and then she had to deliver baby.  Her emotions were anger, sadness, and confused. She rated all of these at 100%. She reports she was angry with God and her husband and upset at herself. We identified many automatic thoughts that she had. A lot were asking herself what happened and why did this happen and how did this happen. She expressed a thought that she didn't want to face the people that she had shared this happy news with before. She was asking herself why me? What did I do? She identified one thought was that she felt that she was too stressed and stressed her body out which is what caused the stillborn. She also was angry at her husband and God, asking herself why she would fall in love with a man who could not give her the children she wanted. She identified another thought as all my plans are washed away.   We decided to work on 2 automatic thoughts - all my plans are washed away and I stressed my body out and that caused the stillbirth. We identified evidence that supported the thought about the plans including she was not going to be able to homeschool all of the children.  Her plan of being in normal baby home and having a complete family was not going to happen.  We also identified evidence that did not support the thought.  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  She reports that she also had thoughts that she would not be able to live anymore and the evidence that does not support that that is that she is currently living and she has not had a heart attack and so her plan that she would not be living and that she would have a heart attack with did not happen.  She also reports that she got back in touch with old friends due to the situation.  She reports that in response to these thoughts or behaviors that she  was feeling tired all the time and she was more irritable towards the nurses that come to her home.  She also was not getting out of bed and eating junk food.  She reports an alternative thought that she had was that some plants of hers are washed away but that she will get another chance.  We also discussed the other thought that she had about her stress causing the situation.  She reports the evidence that supported the thought was that she was feeling tired all the time.  She reports evidence that did not support that that is that her blood pressure was fine she did not have gestational diabetes.  She reports the doctors also said that the stress did not cause the stillbirth.  The evidence that also does not support is that her husband has a depletion on the chromosomes and that might of contributed.  She reports an alternative that that she has had is that she did not cause the situation to happen.  Another alternative thought is that some things are a mystery and should be left alone.  She reports that when she has these alternatives she does feel more joy and happiness.  She reports that in response her behaviors is that she will want to eat better, take online classes, cut out sugar and eat a Mediterranean diet.  At the end of the session, patient identified that the session was helpful.  Patient actually asked for additional printouts of the initial thought record.  We set up a follow-up appointment.  Our next appointment will be when her due date with her son was. She reports she was initially just thinking about laying in bed so it will be helpful to have the therapy appointment.   Treatment Intervention: Cognitive Behavioral therapy  Medical Necessity: Improved patient condition  Assessment Tools:    11/26/2023   10:49 AM 10/02/2023    8:10 AM 09/22/2023   11:16 AM  Depression screen PHQ 2/9  Decreased Interest 3 3 3   Down, Depressed, Hopeless 3 3 3   PHQ - 2 Score 6 6 6   Altered sleeping  3 3 3   Tired, decreased energy 3  3  Change in appetite 3 3 1   Feeling bad or failure about yourself  3 3 2   Trouble concentrating 3 2 2   Moving slowly or fidgety/restless 1 1 0  Suicidal thoughts 1 1 1   PHQ-9 Score 23 19 18   Difficult doing work/chores Extremely dIfficult Extremely dIfficult Very difficult   Failed to redirect to the Timeline version of the REVFS SmartLink. Flowsheet Row Clinical Support from 11/26/2023 in [facility] Counselor from 10/02/2023 in [facility] Admission (Discharged) from 09/13/2023 in [place] [facility]  C-SSRS RISK CATEGORY Error: Q3, 4, or 5 should not be populated when Q2 is No Low Risk No Risk    Collaboration of Care: Other Psychologist Dr. Ezzard  Patient/Guardian was advised Release of Information must be obtained prior to any record release in order to collaborate their care with an outside provider. Patient/Guardian was advised if they have not already done so to contact the registration department to sign all necessary forms in order for us  to release information regarding their care.   Consent: Patient/Guardian gives verbal consent for treatment and assignment of benefits for services provided during this visit. Patient/Guardian expressed understanding and agreed to proceed.   Plan:  -continue to work on thought records for different situations in patient's life   Corean Minor, MD, PGY-3 01/18/2024

## 2024-01-21 ENCOUNTER — Ambulatory Visit (INDEPENDENT_AMBULATORY_CARE_PROVIDER_SITE_OTHER): Admitting: Psychiatry

## 2024-01-21 DIAGNOSIS — F339 Major depressive disorder, recurrent, unspecified: Secondary | ICD-10-CM

## 2024-01-21 DIAGNOSIS — F4310 Post-traumatic stress disorder, unspecified: Secondary | ICD-10-CM | POA: Diagnosis not present

## 2024-01-29 NOTE — Progress Notes (Unsigned)
 [facility] [address] [place] [address] Dept: [phone] Dept Fax: [phone]  Psychotherapy Progress Note  Patient ID: Haley Kramer, female  DOB: 02-01-1986, 38 y.o.  MRN: [id number]  01/29/2024 Start time: 8:00am End time: 8:50am  Method of Visit: Face-to-Face  Present: patient  Current Concerns: anxiety, depression  Current Symptoms: Anger, Anxiety, and Depressed Mood  Psychiatric Specialty Exam: General Appearance: Casual, has braces  Eye Contact:  Fair  Speech:  Clear and Coherent  Volume:  Normal  Mood:  Depressed  Affect:  Appropriate  Thought Process:  Coherent  Orientation:  Full (Time, Place, and Person)  Thought Content:  Logical and Rumination  Suicidal Thoughts:  Not currently  Homicidal Thoughts:  No  Memory:  Immediate;   Fair  Judgement:  Fair  Insight:  Present  Psychomotor Activity:  Normal  Concentration:  Concentration: Fair  Recall:  [employer] of Knowledge:Fair  Language: Good  Akathisia:  No  Handed:  unknown  AIMS (if indicated):  not done  Assets:  Communication Skills Desire for Improvement Housing Intimacy Resilience Social Support Talents/Skills  ADL's:  Intact  Cognition: WNL  Sleep:  Fair   Diagnosis: MDD, PTSD  Anticipated Frequency of Visits: biweekly Anticipated Length of Treatment Episode: 20 sessions  Short Term Goals/Goals for Treatment Session: find different stress relief, less negative thoughts Progress Towards Goals: Not Progressing -went for iron  infusion  Patient presents to therapy by herself.   Today we went over her cognitive distortions.  Patient identified multiple categories of distorted automatic thoughts that she felt applied to her including fortune telling, catastrophizing, discounting positive's, negative filtering, over generalizing, dichotomous thinking, shoulds, unfair comparisons, regret orientation, what if, emotional reasoning,  and inability to disconfirm.  Patient reports that she has been feeling like everything is going to get worse.  She reports the evidence that things will get worse is her son's miscarriage and her son having breathing issues and her not finding a full-time job and thinking that no job is worth her getting out of the house for her.  She also feels frustrated with the timing of her next med management appointment.  And she also feels frustrated that her other family members do not do anything with her son though she later identifies that her grandpa and aunt are halfway trained.  She was stating multiple categories of distorted automatic thoughts during the session including catastrophizing, fortune telling, discounting positives, blaming.  Identified some of her categories of distorted automatic thoughts.  We identified some alternative evidence including the fact that her son is still alive and he is growing and reaching milestones.  Another evidence is that she is still alive.  No evidence is that her son's trach will come out.  Another piece of positive evidence is that her sister and stepmom asked her to go to [place] as well as her father-in-law getting married.  Discussed that while there have been some difficult situations in her life there are also positive situations as well.  She reports that she had some passive suicidal ideations without a plan yesterday and she currently denies suicidal ideations and is able to contract for safety and is aware of emergency resources.  She reports protective factors mainly her son.  Also problem solved with the patient regarding her delayed appointment and informed her that she did have options including coming to the walk-in clinic or finding another clinic that has more provider med management availability.  She eventually acknowledged that these could be options for her and that she could make it to the walk-ins if she needed to.  Patient was stating multiple  categories of distorted automatic thoughts in relation to her son that requires a trach as well as the miscarriage with her son.  Treatment Intervention: Cognitive Behavioral therapy  Medical Necessity: Improved patient condition  Assessment Tools:    11/26/2023   10:49 AM 10/02/2023    8:10 AM 09/22/2023   11:16 AM  Depression screen PHQ 2/9  Decreased Interest 3 3 3   Down, Depressed, Hopeless 3 3 3   PHQ - 2 Score 6 6 6   Altered sleeping 3 3 3   Tired, decreased energy 3  3  Change in appetite 3 3 1   Feeling bad or failure about yourself  3 3 2   Trouble concentrating 3 2 2   Moving slowly or fidgety/restless 1 1 0  Suicidal thoughts 1 1 1   PHQ-9 Score 23 19 18   Difficult doing work/chores Extremely dIfficult Extremely dIfficult Very difficult   Failed to redirect to the Timeline version of the REVFS SmartLink. Flowsheet Row Clinical Support from 11/26/2023 in [facility] Counselor from 10/02/2023 in [facility] Admission (Discharged) from 09/13/2023 in [place] [facility]  C-SSRS RISK CATEGORY Error: Q3, 4, or 5 should not be populated when Q2 is No Low Risk No Risk    Collaboration of Care: Other psychiatrist attending MD  Patient/Guardian was advised Release of Information must be obtained prior to any record release in order to collaborate their care with an outside provider. Patient/Guardian was advised if they have not already done so to contact the registration department to sign all necessary forms in order for us  to release information regarding their care.   Consent: Patient/Guardian gives verbal consent for treatment and assignment of benefits for services provided during this visit. Patient/Guardian expressed understanding and agreed to proceed.   Plan:  -continue to work on identifying the categories of distorted automatic thoughts that she has as well as identifying more balanced and realistic  thoughts  Corean Minor, MD, PGY-3 01/29/2024

## 2024-02-03 ENCOUNTER — Encounter: Payer: Self-pay | Admitting: Neurology

## 2024-02-03 ENCOUNTER — Ambulatory Visit: Admitting: Neurology

## 2024-02-04 ENCOUNTER — Encounter (HOSPITAL_COMMUNITY): Payer: Self-pay

## 2024-02-04 ENCOUNTER — Encounter (HOSPITAL_COMMUNITY): Admitting: Student in an Organized Health Care Education/Training Program

## 2024-02-04 ENCOUNTER — Ambulatory Visit (HOSPITAL_COMMUNITY): Admitting: Psychiatry

## 2024-02-04 ENCOUNTER — Telehealth (HOSPITAL_COMMUNITY): Payer: Self-pay | Admitting: Psychiatry

## 2024-02-04 NOTE — Telephone Encounter (Signed)
 Called patient at [phone] at 8:05am for scheduled therapy appointment at 8am. Patient reports that she is not feeling physically well and not able to make appointment today. We rescheduled for Oct 23 at 8am.

## 2024-02-11 ENCOUNTER — Encounter (HOSPITAL_COMMUNITY): Admitting: Student in an Organized Health Care Education/Training Program

## 2024-02-26 NOTE — Progress Notes (Unsigned)
 [facility] [address] [place] [address] Dept: [phone] Dept Fax: [phone]  Psychotherapy Progress Note  Patient ID: Haley Kramer, female  DOB: [birth year], 38 y.o.  MRN: [id number]  02/26/2024 Start time: 8:00am End time: 8:50am  Method of Visit: Face-to-Face  Present: patient  Current Concerns: anxiety, depression  Current Symptoms: Anger, Anxiety, and Depressed Mood  Psychiatric Specialty Exam: General Appearance: Casual, has braces  Eye Contact:  Fair  Speech:  Clear and Coherent  Volume:  Normal  Mood:  Depressed  Affect:  Appropriate  Thought Process:  Coherent  Orientation:  Full (Time, Place, and Person)  Thought Content:  Logical and Rumination  Suicidal Thoughts:  Not currently  Homicidal Thoughts:  No  Memory:  Immediate;   Fair  Judgement:  Fair  Insight:  Present  Psychomotor Activity:  Normal  Concentration:  Concentration: Fair  Recall:  [employer] of Knowledge:Fair  Language: Good  Akathisia:  No  Handed:  unknown  AIMS (if indicated):  not done  Assets:  Communication Skills Desire for Improvement Housing Intimacy Resilience Social Support Talents/Skills  ADL's:  Intact  Cognition: WNL  Sleep:  Fair   Diagnosis: MDD, PTSD  Anticipated Frequency of Visits: biweekly Anticipated Length of Treatment Episode: 20 sessions  Short Term Goals/Goals for Treatment Session: find different stress relief, less negative thoughts Progress Towards Goals: Not Progressing -went for iron  infusion  Patient presents to therapy by herself.   Today we went over her cognitive distortions.  Patient identified multiple categories of distorted automatic thoughts that she felt applied to her including fortune telling, catastrophizing, discounting positive's, negative filtering, over generalizing, dichotomous thinking, shoulds, unfair comparisons, regret orientation, what if, emotional  reasoning, and inability to disconfirm.  Patient reports that she has been feeling like everything is going to get worse.  She reports the evidence that things will get worse is her son's miscarriage and her son having breathing issues and her not finding a full-time job and thinking that no job is worth her getting out of the house for her.  She also feels frustrated with the timing of her next med management appointment.  And she also feels frustrated that her other family members do not do anything with her son though she later identifies that her grandpa and aunt are halfway trained.  She was stating multiple categories of distorted automatic thoughts during the session including catastrophizing, fortune telling, discounting positives, blaming.  Identified some of her categories of distorted automatic thoughts.  We identified some alternative evidence including the fact that her son is still alive and he is growing and reaching milestones.  Another evidence is that she is still alive.  No evidence is that her son's trach will come out.  Another piece of positive evidence is that her sister and stepmom asked her to go to [place] as well as her father-in-law getting married.  Discussed that while there have been some difficult situations in her life there are also positive situations as well.  She reports that she had some passive suicidal ideations without a plan yesterday and she currently denies suicidal ideations and is able to contract for safety and is aware of emergency resources.  She reports protective factors mainly her son.  Also problem solved with the patient regarding her delayed appointment and informed her that she did have options including coming to the walk-in clinic or finding another clinic that has more provider med management availability.  She eventually acknowledged that these could be options for her and that she could make it to the walk-ins if she needed to.  Patient was stating  multiple categories of distorted automatic thoughts in relation to her son that requires a trach as well as the miscarriage with her son.  Treatment Intervention: Cognitive Behavioral therapy  Medical Necessity: Improved patient condition  Assessment Tools:    11/26/2023   10:49 AM 10/02/2023    8:10 AM 09/22/2023   11:16 AM  Depression screen PHQ 2/9  Decreased Interest 3 3 3   Down, Depressed, Hopeless 3 3 3   PHQ - 2 Score 6 6 6   Altered sleeping 3 3 3   Tired, decreased energy 3  3  Change in appetite 3 3 1   Feeling bad or failure about yourself  3 3 2   Trouble concentrating 3 2 2   Moving slowly or fidgety/restless 1 1 0  Suicidal thoughts 1 1 1   PHQ-9 Score 23 19 18   Difficult doing work/chores Extremely dIfficult Extremely dIfficult Very difficult   Failed to redirect to the Timeline version of the REVFS SmartLink. Flowsheet Row Clinical Support from 11/26/2023 in [facility] Counselor from 10/02/2023 in [facility] Admission (Discharged) from 09/13/2023 in [place] [facility]  C-SSRS RISK CATEGORY Error: Q3, 4, or 5 should not be populated when Q2 is No Low Risk No Risk    Collaboration of Care: Other psychiatrist attending MD  Patient/Guardian was advised Release of Information must be obtained prior to any record release in order to collaborate their care with an outside provider. Patient/Guardian was advised if they have not already done so to contact the registration department to sign all necessary forms in order for us  to release information regarding their care.   Consent: Patient/Guardian gives verbal consent for treatment and assignment of benefits for services provided during this visit. Patient/Guardian expressed understanding and agreed to proceed.   Plan:  -continue to work on identifying the categories of distorted automatic thoughts that she has as well as identifying more balanced and realistic  thoughts  Corean Minor, MD, PGY-3 02/26/2024

## 2024-03-03 ENCOUNTER — Ambulatory Visit (HOSPITAL_COMMUNITY): Admitting: Psychiatry

## 2024-03-09 NOTE — Progress Notes (Unsigned)
 [facility] [address] [place] [address] Dept: [phone] Dept Fax: [phone]  Psychotherapy Progress Note  Patient ID: Haley Kramer, female  DOB: 07-Dec-1985, 38 y.o.  MRN: [id number]  03/09/2024 Start time: 8:00am End time: 8:50am  Method of Visit: Face-to-Face  Present: patient  Current Concerns: anxiety, depression  Current Symptoms: Anger, Anxiety, and Depressed Mood  Psychiatric Specialty Exam: General Appearance: Casual, has braces  Eye Contact:  Fair  Speech:  Clear and Coherent  Volume:  Normal  Mood:  Depressed  Affect:  Appropriate  Thought Process:  Coherent  Orientation:  Full (Time, Place, and Person)  Thought Content:  Logical and Rumination  Suicidal Thoughts:  Not currently  Homicidal Thoughts:  No  Memory:  Immediate;   Fair  Judgement:  Fair  Insight:  Present  Psychomotor Activity:  Normal  Concentration:  Concentration: Fair  Recall:  [employer] of Knowledge:Fair  Language: Good  Akathisia:  No  Handed:  unknown  AIMS (if indicated):  not done  Assets:  Communication Skills Desire for Improvement Housing Intimacy Resilience Social Support Talents/Skills  ADL's:  Intact  Cognition: WNL  Sleep:  Fair   Diagnosis: MDD, PTSD  Anticipated Frequency of Visits: biweekly Anticipated Length of Treatment Episode: 20 sessions  Short Term Goals/Goals for Treatment Session: find different stress relief, less negative thoughts  Progress Towards Goals: Not Progressing Interval: went for iron  infusion, cancelled therapy appointment with this provider and also cancelled appointment for medication management  Patient presents to therapy by herself.   Today we went over her cognitive distortions.  Patient identified multiple categories of distorted automatic thoughts that she felt applied to her including fortune telling, catastrophizing, discounting positive's, negative filtering,  over generalizing, dichotomous thinking, shoulds, unfair comparisons, regret orientation, what if, emotional reasoning, and inability to disconfirm.  Patient reports that she has been feeling like everything is going to get worse.  She reports the evidence that things will get worse is her son's miscarriage and her son having breathing issues and her not finding a full-time job and thinking that no job is worth her getting out of the house for her.  She also feels frustrated with the timing of her next med management appointment.  And she also feels frustrated that her other family members do not do anything with her son though she later identifies that her grandpa and aunt are halfway trained.  She was stating multiple categories of distorted automatic thoughts during the session including catastrophizing, fortune telling, discounting positives, blaming.  Identified some of her categories of distorted automatic thoughts.  We identified some alternative evidence including the fact that her son is still alive and he is growing and reaching milestones.  Another evidence is that she is still alive.  No evidence is that her son's trach will come out.  Another piece of positive evidence is that her sister and stepmom asked her to go to [place] as well as her father-in-law getting married.  Discussed that while there have been some difficult situations in her life there are also positive situations as well.  She reports that she had some passive suicidal ideations without a plan yesterday and she currently denies suicidal ideations and is able to contract for safety and is aware of emergency resources.  She reports protective factors mainly her son.  Also problem solved with the patient regarding her delayed appointment and informed her that she did have options including coming to  the walk-in clinic or finding another clinic that has more provider med management availability.  She eventually acknowledged that these  could be options for her and that she could make it to the walk-ins if she needed to.  Patient was stating multiple categories of distorted automatic thoughts in relation to her son that requires a trach as well as the miscarriage with her son.  Treatment Intervention: Cognitive Behavioral therapy  Medical Necessity: Improved patient condition  Assessment Tools:    11/26/2023   10:49 AM 10/02/2023    8:10 AM 09/22/2023   11:16 AM  Depression screen PHQ 2/9  Decreased Interest 3 3 3   Down, Depressed, Hopeless 3 3 3   PHQ - 2 Score 6 6 6   Altered sleeping 3 3 3   Tired, decreased energy 3  3  Change in appetite 3 3 1   Feeling bad or failure about yourself  3 3 2   Trouble concentrating 3 2 2   Moving slowly or fidgety/restless 1 1 0  Suicidal thoughts 1 1 1   PHQ-9 Score 23 19 18   Difficult doing work/chores Extremely dIfficult Extremely dIfficult Very difficult   Failed to redirect to the Timeline version of the REVFS SmartLink. Flowsheet Row Clinical Support from 11/26/2023 in [facility] Counselor from 10/02/2023 in [facility] Admission (Discharged) from 09/13/2023 in [place] [facility]  C-SSRS RISK CATEGORY Error: Q3, 4, or 5 should not be populated when Q2 is No Low Risk No Risk    Collaboration of Care: Other psychiatrist attending MD  Patient/Guardian was advised Release of Information must be obtained prior to any record release in order to collaborate their care with an outside provider. Patient/Guardian was advised if they have not already done so to contact the registration department to sign all necessary forms in order for us  to release information regarding their care.   Consent: Patient/Guardian gives verbal consent for treatment and assignment of benefits for services provided during this visit. Patient/Guardian expressed understanding and agreed to proceed.   Plan:  -continue to work on identifying the  categories of distorted automatic thoughts that she has as well as identifying more balanced and realistic thoughts  Corean Minor, MD, PGY-3 03/09/2024

## 2024-03-10 ENCOUNTER — Ambulatory Visit (HOSPITAL_COMMUNITY): Admitting: Psychiatry

## 2024-03-18 ENCOUNTER — Telehealth (HOSPITAL_COMMUNITY): Admitting: Student in an Organized Health Care Education/Training Program

## 2024-03-18 DIAGNOSIS — F339 Major depressive disorder, recurrent, unspecified: Secondary | ICD-10-CM

## 2024-03-18 DIAGNOSIS — G4700 Insomnia, unspecified: Secondary | ICD-10-CM

## 2024-03-18 DIAGNOSIS — R519 Headache, unspecified: Secondary | ICD-10-CM

## 2024-03-18 DIAGNOSIS — F4321 Adjustment disorder with depressed mood: Secondary | ICD-10-CM

## 2024-03-18 DIAGNOSIS — G4726 Circadian rhythm sleep disorder, shift work type: Secondary | ICD-10-CM

## 2024-03-18 DIAGNOSIS — F418 Other specified anxiety disorders: Secondary | ICD-10-CM

## 2024-03-18 MED ORDER — TRAZODONE HCL 50 MG PO TABS: 50.0000 mg | ORAL_TABLET | Freq: Every evening | ORAL | 0 refills | Status: DC | PRN

## 2024-03-18 MED ORDER — BUPROPION HCL ER (XL) 150 MG PO TB24: 150.0000 mg | ORAL_TABLET | Freq: Every day | ORAL | 0 refills | Status: DC
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
## 2024-04-12 ENCOUNTER — Other Ambulatory Visit (HOSPITAL_COMMUNITY): Payer: Self-pay | Admitting: Student in an Organized Health Care Education/Training Program

## 2024-04-12 DIAGNOSIS — F5105 Insomnia due to other mental disorder: Secondary | ICD-10-CM

## 2024-04-22 ENCOUNTER — Telehealth (INDEPENDENT_AMBULATORY_CARE_PROVIDER_SITE_OTHER): Admitting: Student in an Organized Health Care Education/Training Program

## 2024-04-22 DIAGNOSIS — F418 Other specified anxiety disorders: Secondary | ICD-10-CM

## 2024-04-22 DIAGNOSIS — F339 Major depressive disorder, recurrent, unspecified: Secondary | ICD-10-CM

## 2024-04-22 MED ORDER — TRAZODONE HCL 100 MG PO TABS: 100.0000 mg | ORAL_TABLET | Freq: Every evening | ORAL | 0 refills | Status: DC | PRN

## 2024-04-22 MED ORDER — ESCITALOPRAM OXALATE 20 MG PO TABS: 20.0000 mg | ORAL_TABLET | Freq: Every day | ORAL | 0 refills | Status: DC
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
## 2024-04-22 NOTE — Patient Instructions (Signed)
 Outpatient Therapy and Psychiatry Resources for Patients: Your psychiatric needs would be well-served by consultation and regular meetings with an outpatient therapist to assist you with your mood-related conditions. Here are a series of links for finding a therapist.     Includes links to the following: [facility] (http://[email or web address]/) (only for [facility] and please reserve for uninsured) [facility] (http://blankenship-[email or web address]/) Psychology Today Special educational needs teacher (https://www.psychologytoday.com/us/therapists) Psychology Today Support Group Finder (https://www.psychologytoday.com/us/groups/) [facility] Location (https://carolinabehavioralcare.com/staff-location/[place]/) [facility] [place] - Support Group Finder - (RecordDebt.fi) [facility] [employer] [facility] (https://fspcares.org/contact/) [employer] [facility] [place] [facility] (https://namiguilford.org/support-and-education/support-groups/) Interior and spatial designer Health - Affiliated with [facility] (https://www.[place].com/lb/locations/profile/[facility]-behavioral-medicine-at-[address]/) Dept of Health and Human Services - Find a mental health facility (http://lester.info/)

## 2024-06-09 ENCOUNTER — Telehealth (HOSPITAL_COMMUNITY): Admitting: Student in an Organized Health Care Education/Training Program

## 2024-06-09 DIAGNOSIS — F418 Other specified anxiety disorders: Secondary | ICD-10-CM | POA: Diagnosis not present

## 2024-06-09 DIAGNOSIS — F5105 Insomnia due to other mental disorder: Secondary | ICD-10-CM | POA: Diagnosis not present

## 2024-06-09 DIAGNOSIS — F339 Major depressive disorder, recurrent, unspecified: Secondary | ICD-10-CM

## 2024-06-09 MED ORDER — ESCITALOPRAM OXALATE 20 MG PO TABS: 20.0000 mg | ORAL_TABLET | Freq: Every day | ORAL | 1 refills | Status: AC

## 2024-06-09 MED ORDER — TRAZODONE HCL 100 MG PO TABS: 100.0000 mg | ORAL_TABLET | Freq: Every evening | ORAL | 1 refills | Status: AC | PRN
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
## 2024-08-04 ENCOUNTER — Telehealth (HOSPITAL_COMMUNITY): Admitting: Student in an Organized Health Care Education/Training Program
